# Patient Record
Sex: Male | Born: 1960 | Race: White | Hispanic: No | Smoking: Former smoker
Health system: Southern US, Community
[De-identification: ages and names within clinical notes are randomized; demographics above are authoritative.]

## PROBLEM LIST (undated history)

## (undated) DIAGNOSIS — J189 Pneumonia, unspecified organism: Secondary | ICD-10-CM

## (undated) DIAGNOSIS — E119 Type 2 diabetes mellitus without complications: Secondary | ICD-10-CM

## (undated) DIAGNOSIS — K029 Dental caries, unspecified: Secondary | ICD-10-CM

## (undated) DIAGNOSIS — K219 Gastro-esophageal reflux disease without esophagitis: Secondary | ICD-10-CM

## (undated) DIAGNOSIS — I10 Essential (primary) hypertension: Secondary | ICD-10-CM

## (undated) DIAGNOSIS — G4730 Sleep apnea, unspecified: Secondary | ICD-10-CM

## (undated) DIAGNOSIS — C801 Malignant (primary) neoplasm, unspecified: Secondary | ICD-10-CM

## (undated) HISTORY — PX: UMBILICAL HERNIA REPAIR: SHX196

---
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
## 2005-03-19 ENCOUNTER — Emergency Department (HOSPITAL_COMMUNITY): Admission: EM | Admit: 2005-03-19 | Discharge: 2005-03-20 | Payer: Self-pay | Admitting: Emergency Medicine

## 2005-06-08 ENCOUNTER — Emergency Department (HOSPITAL_COMMUNITY): Admission: EM | Admit: 2005-06-08 | Discharge: 2005-06-08 | Payer: Self-pay | Admitting: Emergency Medicine

## 2007-01-22 ENCOUNTER — Inpatient Hospital Stay (HOSPITAL_COMMUNITY): Admission: EM | Admit: 2007-01-22 | Discharge: 2007-01-26 | Payer: Self-pay | Admitting: Emergency Medicine

## 2008-07-12 ENCOUNTER — Emergency Department (HOSPITAL_BASED_OUTPATIENT_CLINIC_OR_DEPARTMENT_OTHER): Admission: EM | Admit: 2008-07-12 | Discharge: 2008-07-12 | Payer: Self-pay | Admitting: Emergency Medicine

## 2008-08-05 ENCOUNTER — Encounter: Payer: Self-pay | Admitting: Emergency Medicine

## 2008-08-05 ENCOUNTER — Inpatient Hospital Stay (HOSPITAL_COMMUNITY): Admission: EM | Admit: 2008-08-05 | Discharge: 2008-08-08 | Payer: Self-pay | Admitting: Internal Medicine

## 2008-09-05 ENCOUNTER — Encounter: Payer: Self-pay | Admitting: Emergency Medicine

## 2008-09-05 ENCOUNTER — Inpatient Hospital Stay (HOSPITAL_COMMUNITY): Admission: EM | Admit: 2008-09-05 | Discharge: 2008-09-09 | Payer: Self-pay | Admitting: Internal Medicine

## 2011-04-19 NOTE — H&P (Signed)
NAMEJADD, GASIOR NO.:  [id number]   MEDICAL RECORD NO.:  [id number]          PATIENT TYPE:  INP   LOCATION:  1526                         FACILITY:  [facility]   PHYSICIAN:  Della Goo, M.D. DATE OF BIRTH:  1960-12-18   DATE OF ADMISSION:  09/06/2008  DATE OF DISCHARGE:                              HISTORY & PHYSICAL   PRIMARY CARE PHYSICIAN:  Dr. Smith Mince.   CHIEF COMPLAINT:  Abdominal pain, diarrhea.   HISTORY OF PRESENT ILLNESS:  This is a 50 year old male presenting to  the emergency department with complaints of severe worsening abdominal  pain, discomfort and diarrhea since he was hospitalized and discharged  on August 08, 2008.  The patient was hospitalized August 05, 2008  until August 08, 2008, with enterocolitis, which was viral versus  bacterial in etiology.  The patient reports having three episodes of  loose stools daily since.  He denies any unusual or foul odor.  He  denies having any fevers or chills.  He does report beginning to have  nausea and vomiting.  He denies hematochezia, melena, passage and also  denies having any hematemesis.  When the patient was hospitalized, he  was placed on ciprofloxacin and Flagyl therapy and was discharged home  to take an additional 12 days of oral therapy.  The patient reports  taking this medication off and on and still has medication left over at  this time.   PAST MEDICAL HISTORY:  As mentioned above.  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  The patient reports not drinking  since that episode of pancreatitis.   MEDICATIONS:  Mentioned above.  Also, the patient had Phenergan p.r.n.  and Percocet p.r.n. pain.   PAST SURGICAL HISTORY:  History of nasal fracture surgery as a child.   SOCIAL HISTORY:  The patient denies tobacco usage, and he reports  quitting alcohol 2 months ago.   FAMILY HISTORY:  Negative for hypertension, coronary artery disease,  diabetes or  cancer.   REVIEW OF SYSTEMS:  Pertinents are mentioned above.   PHYSICAL EXAMINATION:  GENERAL:  This is a 50 year old well-nourished,  well-developed male in no discomfort or acute distress.  VITAL SIGNS:  Temperature 97.9, blood pressure 142/95, heart rate 100,  respirations 22.  O2 saturation 97-98%.  HEENT:  Normocephalic, atraumatic.  There is no scleral icterus.  Pupils  are equally round and reactive to light.  Extraocular movements are  intact.  Funduscopic benign.  Oropharynx is clear.  NECK:  Supple.  Full range of motion.  No thyromegaly, adenopathy or  jugulovenous distention.  CARDIOVASCULAR:  Regular rate and rhythm.  No murmurs, gallops or rubs.  LUNGS:  Clear to auscultation bilaterally.  ABDOMEN:  Positive bowel sounds.  Soft, nontender, nondistended.  EXTREMITIES: Without cyanosis, clubbing or edema.  NEUROLOGIC:  Nonfocal.   LABORATORY STUDIES:  White blood cell count 11.1, hemoglobin 15.3,  hematocrit 46.2, platelets 257, neutrophils 89%, lymphocytes 9%.  Sodium  144, potassium 4.0, chloride 108, bicarb 22, BUN 8, creatinine 0.7 and  glucose 127, albumin 4.9, lipase 67.  Urinalysis negative,  except for  greater than 80 ketones.  Alcohol level less than 10.  Urine drug screen  positive for benzodiazepines and positive for opiates.  Fecal occult  blood testing negative.  CT scan of the abdomen and pelvis revealed  mucosal edema of the sigmoid colon and rectum, which is slightly  improved compared to previous.   ASSESSMENT:  A 50 year old male being admitted with;  1. Abdominal pain.  2. Enterocolitis.  3. Diarrhea.   PLAN:  The patient will be admitted and placed on a clear liquid diet  for bowel rest at this time.  IV fluids have also been ordered for  maintenance therapy and rehydration.  The patient will be placed on IV  Cipro and IV Flagyl therapy.  P.r.n. antiemetics and pain control  therapy have been ordered.  Stool cultures will be sent for culture  and  sensitivity along with C diff studies and white blood cells.  DVT and GI  prophylaxis will also be ordered.  The patient will be placed on contact  precautions for now.  A GI consultation will be placed.      Della Goo, M.D.  Electronically Signed     HJ/MEDQ  D:  09/06/2008  T:  09/06/2008  Job:  [id number]   cc:   Talmadge Coventry, M.D.  Fax: [phone]

## 2011-04-19 NOTE — Discharge Summary (Signed)
[name], [name]:  [id number]   MEDICAL RECORD NO.:  [id number]          PATIENT TYPE:  INP   LOCATION:  1526                         FACILITY:  [facility]   PHYSICIAN:  [name], M.D.  DATE OF BIRTH:  1961/09/02   DATE OF ADMISSION:  09/05/2008  DATE OF DISCHARGE:  09/09/2008                               DISCHARGE SUMMARY   PRIMARY MEDICAL DOCTOR:  [name], M.D.   DISCHARGE DIAGNOSES:  1. Diarrhea, query etiology.  Workup negative.  2. History of alcohol-induced pancreatitis August 2009.  3. Smoking history.  4. History of childhood nasal fracture.   DISCHARGE MEDICATIONS:  1. Ciprofloxacin 500 mg p.o. b.i.d. for 14 days only.  2. Flagyl 500 mg p.o. t.i.d. for 14 days only.   PROCEDURES:  1. Abdominal/pelvic CT scan dated September 05, 2008.  This showed      distal small bowel and colonic bowel wall edema, persistent but      slightly improved from prior.  No free fluid in pelvis.  No pelvic      lymphadenopathy.  There is mucosal edema of the sigmoid colon and      rectum.  2. Colonoscopy dated September 08, 2008.  This was a negative study.  3. Abdominal ultrasound scan dated September 09, 2008.  This was an      unremarkable ultrasound of the abdomen.   CONSULTATIONS:  1. [name], M.D., gastroenterologist.  2. [name], M.D., gastroenterologist.   ADMISSION HISTORY:  As in H and P notes of September 06, 2008, dictated by  [name].  However, in brief, this is a 50 year old male,  with known history of alcohol-induced pancreatitis August 2009, smoking  history, history of nasal fracture in childhood, presenting with  diarrhea since discharge from hospitalization August 06, 2008 to  August 08, 2008 for similar symptoms.  He had been discharged on a  combination of Ciprofloxacin and Flagyl, and states that he has been  compliant with these medications since then.  Abdominal CT scan was done  which  showed findings consistent with enterocolitis.  He was admitted  for further evaluation, investigation and management.   CLINICAL COURSE:  1. Diarrhea.  For details of presentation, refer to admission history      above.  The patient was maintained on bowel rest, intravenous fluid      hydration, proton pump inhibitor, continued on Ciprofloxacin and      Flagyl.  Stool studies were sent off for C. difficile toxin, and      were consistently negative.  GI consultation was kindly provided by      [name], who felt that a colonoscopy was indicated to rule      out possible inflammatory bowel disease.  Colonoscopy was carried      out on September 08, 2008 by [name], and this was entirely      negative with no evidence of inflammation or other lesion.  Dr.      Loreta Bell recommended obtaining abdominal ultrasound scan to rule out  gallbladder disease.  This was carried out on September 09, 2008, and      was an unremarkable study.  The patient has been reassured      accordingly.  Per GI recommendations, the patient is to continue      Ciprofloxacin and Flagyl for another 2 weeks, and follow up with      gastroenterologist in due course.   1. Smoking history.  The patient was counseled appropriately.   DISPOSITION:  The patient was on September 09, 2008, considered clinically  stable for discharge, and was therefore discharged accordingly.   DIET:  Low residue.   ACTIVITY:  As tolerated.   FOLLOW-UP INSTRUCTIONS:  1. The patient is to follow up with [name], gastroenterologist      in 1-2 weeks.  He has been supplied with appropriate information.  2. In addition, he is to follow up routinely with his primary MD, Dr.      Talmadge Bell per prior scheduled appointment.   All this has been communicated to the patient and he has verbalized  understanding.      [name], M.D.  Electronically Signed     CO/MEDQ  D:  09/09/2008  T:  09/09/2008  Job:   [id number]   cc:   [name], M.D.  Fax: [phone]   [name], M.D.  Fax: [phone]   NGEXBM WUX LKGM, M.D.  Fax: [phone]

## 2011-04-19 NOTE — Op Note (Signed)
[name], MANSEL NO.:  [id number]   MEDICAL RECORD NO.:  [id number]          PATIENT TYPE:  INP   LOCATION:  1526                         FACILITY:  [facility]   PHYSICIAN:  Anselmo Rod, M.D.  DATE OF BIRTH:  Jul 26, 1961   DATE OF PROCEDURE:  09/08/2008  DATE OF DISCHARGE:  09/09/2008                               OPERATIVE REPORT   PROCEDURE PERFORMED:  Screening colonoscopy.   ENDOSCOPIST:  Anselmo Rod.   INSTRUMENT USED:  Pentax video colonoscope.   INDICATIONS FOR PROCEDURE:  Change in bowel habits with severe diarrhea,  abdominal bloating and gas in a 50 year old white male regarding repeat  hospitalization for question viral gastroenteritis.  The patient had a  CT scan of the abdomen and pelvis recently that showed edema of the  colonic mucosa and small bowel mucosa.  Colonoscopy is being done to  rule out IBD.   PREPROCEDURE PREPARATION:  Informed consent was procured from the  patient.  The patient fasted for 4 hours prior to the procedure and  prepped with a bottle of magnesium citrate and gallon of NuLytely the  night prior to the procedure.  Risks and benefits of the procedure  including a 10% miss rate of cancer and polyps were discussed with the  patient as well.   PREPROCEDURE PHYSICAL:  The patient had stable vital signs.  NECK:  Supple.  CHEST:  Clear to auscultation.  S1-S2 regular.  ABDOMEN:  Soft with normal bowel sounds.   PROCEDURE:  The patient was placed in left lateral decubitus position  and sedated with 75 mcg of Fentanyl and 7.5 mg of Versed given  intravenously in slow incremental doses.  Once the patient was  adequately sedated, maintained on low-flow oxygen and continuous cardiac  monitoring, the Pentax video colonoscope was advanced from the rectum to  the cecum.  The appendical orifice and ileocecal valve were clearly  visualized and photographed.  The terminal ileum appeared healthy and  without lesions.  No  masses, polyps, erosions, ulcerations or  diverticula were noted.   IMPRESSION:  1. Normal colonoscopy of the terminal ileum.  No masses, polyps,      erosions, ulcerations or diverticula noted.  2. No abnormalities noted on retroflexion.   RECOMMENDATIONS:  1. Continue antibiotics for total of at least 2-3 weeks.  2. An abdominal ultrasound has been recommended to rule out      gallstones.  3. Follow up with PCP as need arises in the future.      Anselmo Rod, M.D.  Electronically Signed     JNM/MEDQ  D:  09/09/2008  T:  09/10/2008  Job:  [id number]   cc:   Talmadge Coventry, M.D.  Fax: [phone]

## 2011-04-19 NOTE — H&P (Signed)
NAMEHAWARD, POPE NO.:  [id number]   MEDICAL RECORD NO.:  [id number]          PATIENT TYPE:  INP   LOCATION:  5123                         FACILITY:  [facility]   PHYSICIAN:  Vania Rea, M.D. DATE OF BIRTH:  1961-02-28   DATE OF ADMISSION:  08/06/2008  DATE OF DISCHARGE:                              HISTORY & PHYSICAL   PRIMARY CARE PHYSICIAN:  Dr. Talmadge Coventry.   CHIEF COMPLAINT:  Nausea, vomiting and abdominal pain, worse since this  morning.   HISTORY OF PRESENT ILLNESS:  This is a 50 year old Caucasian gentleman  who was seen in the [facility] emergency room 2 weeks ago with abdominal  pain and serum lipase of 800, and was diagnosed with acute pancreatitis  and discharged home with pain medications and antiemetics to follow up  with his primary care physician.  The patient was not able to follow up  with his primary care physician.  However, his symptoms subsided  substantially.  He did have abdominal pain on off, but today he started  having severe lower abdominal pain with nausea, vomiting and diarrhea  and returned to the emergency room where a CAT scan of the abdomen  revealed extensive enterocolitis and the patient was transferred to  [facility] for admission.   The patient denies any fever, chest pain or cough.  He denies any blood  in the stools.  Says the stool is just brown and watery.  The patient  denies chronic alcohol use, says he drinks alcohol only about twice per  year, but whenever he does have a drink he seems to have flares of  pancreatitis.   PAST MEDICAL HISTORY:  1. Pancreatitis precipitated by alcohol, last episode 2 weeks ago.  2. History of gastritis.  3. History of community-acquired pneumonia in February 2008.   MEDICATIONS:  1. Percocet recently completed  2. Phenergan suppositories   ALLERGIES:  NO KNOWN DRUG ALLERGIES.   SOCIAL HISTORY:  Smokes half a pack per day.  Denies alcohol or illicit  drug  use.   FAMILY HISTORY:  Negative for any hypertension, diabetes, coronary  disease or pancreatic disease or gastrointestinal cancer.   REVIEW OF SYSTEMS:  The patient has never had a colonoscopy and other  than noted above on the 10-point review of systems is unremarkable.   PHYSICAL EXAMINATION:  GENERAL:  Pleasant, middle-aged Caucasian  gentleman lying in the stretcher appears to be in mild painful distress.  VITAL SIGNS:  Temperature is 98.2, pulse 86, respirations 20, blood  pressure 139/84.  He is saturating at 97% on room air.  Weight 107.2  pounds and his height is 5-1/2 inches.  HEENT:  His pupils are round and equal.  Mucous membranes pink and  anicteric.  He has no cervical lymphadenopathy.  No thyromegaly, no  jugulovenous distention.  CHEST:  Clear to auscultation bilaterally.  CARDIOVASCULAR:  Regular rhythm without murmurs.  ABDOMEN:  Soft.  He has decreased bowel sounds and mild lower abdominal  tenderness.  EXTREMITIES:  Without edema.  He has 3+ bounding pulses bilaterally.  CENTRAL NERVOUS SYSTEM: Cranial  nerves II-XII are grossly intact.  He  has no focal neurologic deficit.   LABORATORY DATA:  White count is 17.4, hemoglobin 17.4, hematocrit 51,  platelets 241, 89% neutrophils.  Lipase is 104, serum chemistry with the  exception of glucose of 146 is completely normal.  His albumin is  elevated at 5.2, total protein 8.8, liver function tests, likewise,  completely normal.  His CT scan of the abdomen and pelvis as noted above  showed inflammatory changes in the rectum and sigmoid colon as well as  distal loops of the ileum and terminal ileum.   ASSESSMENT:  Acute enterocolitis.  Tobacco abuse   PLAN:  Will give this gentleman IV fluids.  Pain medications in view of  the inflammatory findings and elevated white count.  Will check stool  for C diff as well as general culture and will add antibiotics as  appropriate.  Will advise on tobacco cessation and offer  nicotine  replacement.      Vania Rea, M.D.  Electronically Signed     LC/MEDQ  D:  08/06/2008  T:  08/06/2008  Job:  [id number]   cc:   Talmadge Coventry, M.D.

## 2011-04-19 NOTE — Discharge Summary (Signed)
[name], [name] NO.:  [id number]   MEDICAL RECORD NO.:  [id number]          PATIENT TYPE:  INP   LOCATION:  5156                         FACILITY:  [facility]   PHYSICIAN:  Hillery Aldo, M.D.   DATE OF BIRTH:  1961/05/30   DATE OF ADMISSION:  08/05/2008  DATE OF DISCHARGE:  08/08/2008                               DISCHARGE SUMMARY   PRIMARY CARE PHYSICIAN:  Talmadge Coventry, M.D.   DISCHARGE DIAGNOSES:  1. Enterocolitis.  2. Diarrhea secondary to enterocolitis.  3. Hypokalemia.  4. History of alcohol-induced pancreatitis.  5. History of alcohol-induced gastritis.  6. Ongoing tobacco abuse.   DISCHARGE MEDICATIONS:  1. Cipro 500 mg b.i.d. x12 days.  2. Flagyl 500 mg t.i.d. x12 days.  3. Protonix 40 mg daily.  4. Percocet 5/325 one tablet q.6 h. p.r.n. pain (#20 written for).   CONSULTATIONS:  None.   BRIEF ADMISSION HISTORY OF PRESENT ILLNESS:  The patient is a 50-year-  old male, who presented to the hospital with chief complaint of nausea,  vomiting, and abdominal pain.  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  He was recently hospitalized at  [facility] for pancreatitis.  Upon initial evaluation in emergency  department, he was not found to have any evidence of recurrent  pancreatitis, but did have extensive enterocolitis on CT scanning of his  abdomen, and therefore, was admitted for further evaluation and  treatment.  For the full details, please see the dictated report done by  Dr. Orvan Falconer.   PROCEDURES AND DIAGNOSTIC STUDIES:  1. Acute abdominal series on August 05, 2008, showed normal bowel      gas pattern and no evidence for free peritoneal air.  No evidence      for active chest disease.  2. CT scan of the abdomen and pelvis on August 05, 2008, showed a      fairly extensive inflammatory process involving the small bowel and      colon.  Diffuse inflammatory process involving the pelvic loops  of      small bowel and colon with inflammatory infectious enterocolitis      being in the differential.   DISCHARGE LABORATORY VALUES:  Sodium is 136, potassium 3.9, chloride  104, bicarb 26, BUN 6, creatinine 0.82, glucose 120.  White blood cell  count was 8.5, hemoglobin 14, hematocrit 41.3, platelets 192.   HOSPITAL COURSE:  1. Enterocolitis:  The patient was admitted and empirically put on      ciprofloxacin and Flagyl.  Stool studies were sent off.  Stool      cultures are negative for any suspicious colonies, but the report      is preliminary at this time.  C. difficile toxin analysis was      negative.  Fecal lactoferrin was negative.  He did have some      hemoccult-positive stool and 1 episode of melena in the hospital,      but this cleared and his hemoglobin did not drop.  At this point,      the patient  is improving dramatically clinically.  He is able to      keep down p.o.'s without difficulty.  The diarrhea, which was      watery on presentation, is now softer, though still not formed.  He      will be discharged on additional 12 days of Cipro and Flagyl, and      he is advised to avoid alcohol intake while on Flagyl due to      serious adverse reaction with combining these substances.  2. Hypokalemia:  Secondary to GI losses.  The patient was      appropriately repleted.  3. History of alcohol pancreatitis/gastritis.  The patient was      maintained on proton pump inhibitor therapy.  His lipase was      checked on initial presentation and was found to be normal at 104.  4. Tobacco abuse:  The patient was counseled on the importance of      cessation.   DISPOSITION:  The patient is medically stable and will be discharged  home.  Advised to follow up with his primary care physician for any  nonresolution of symptoms or in 1-2 weeks for routine hospital followup.   Time spent on discharge, 35 minutes.      Hillery Aldo, M.D.  Electronically Signed      CR/MEDQ  D:  08/08/2008  T:  08/09/2008  Job:  [id number]   cc:   Talmadge Coventry, M.D.

## 2011-04-22 NOTE — Discharge Summary (Signed)
NAMEWINIFRED, BALOGH NO.:  [id number]   MEDICAL RECORD NO.:  [id number]          PATIENT TYPE:  INP   LOCATION:  5713                         FACILITY:  [facility]   PHYSICIAN:  Lonia Blood, M.D.       DATE OF BIRTH:  09-07-1961   DATE OF ADMISSION:  01/22/2007  DATE OF DISCHARGE:  01/26/2007                               DISCHARGE SUMMARY   DISCHARGE DIAGNOSES:  1. Right upper lobe pneumonia.  2. Hypoxic respiratory failure resolved.  3. Tobacco abuse.   DISCHARGE MEDICATIONS:  Avelox 400 mg daily for about a week.   CONDITION ON DISCHARGE:  Mr. Granada was discharged in good condition.  At the time of discharge, he was afebrile with stable vital signs.  The  patient was able to tolerate a regular diet without any complications.   PROCEDURES DURING THIS ADMISSION:  1. Chest x-ray PA and lateral with findings of right upper lobe      pneumonia.  2. CT scan of the chest, abdomen, and pelvis with findings of right      upper lobe pneumonia and no other significant pathology.   CONSULTATIONS:  No consultations were obtained.   HOSPITAL COURSE:  Community-acquired pneumonia.  Mr. Steidle was admitted  with severe right upper lobe pneumonia and fever and chills and lack of  appetite and inability to care for himself.  The patient was placed on  intravenous empiric antibiotics for community-acquired pneumonia and  intravenous fluids, nebulizers, and oxygen.  The patient made a  progressive recovery, and over the course of the next days he improved  to a point where it was felt safe to discharge the patient home.  Mr.  Kafer tolerated a regular diet by the time of discharge, and he was  able to be changed to oral antibiotics without recurrence of his fever.   At the time of discharge, the patient was strongly advised to consider  smoking cessation.      Lonia Blood, M.D.  Electronically Signed     SL/MEDQ  D:  02/05/2007  T:  02/05/2007  Job:  [id number]   cc:    Talmadge Coventry, M.D.

## 2011-04-22 NOTE — H&P (Signed)
NAMECHADWIN, [name] NO.:  [id number]   MEDICAL RECORD NO.:  [id number]          PATIENT TYPE:  INP   LOCATION:  1831                         FACILITY:  [facility]   PHYSICIAN:  Hettie Holstein, D.O.    DATE OF BIRTH:  1961-11-27   DATE OF ADMISSION:  01/22/2007  DATE OF DISCHARGE:                              HISTORY & PHYSICAL   PRIMARY CARE PHYSICIAN:  Dr. Talmadge Coventry.   CHIEF COMPLAINT:  Generalized aches, nausea and inability to keep food  down.   HISTORY OF PRESENT ILLNESS:  [name] is a 50 year old male with a  relatively unremarkable past medical history, who was in his usual state  of health up until Thursday night.  He stated he developed some low back  pain and cramping.  This became quite severe that he developed some  nausea.  He was unable to keep over-the-counter medications or even  water down.  He had some low-grade temperatures.  Otherwise, he has had  no other complaints.  In the emergency department, he is discovered to  have radiographic evidence of pneumonia and elevated white count, as  well as a metabolic profile suggestive of dehydration and he is given  some IV fluids and appears to be hemodynamically stable at present.  He  is being admitted for treatment of pneumonia.   PAST MEDICAL HISTORY:  Unremarkable.   MEDICATIONS:  He takes no prescription medications.   ALLERGIES:  NO KNOWN DRUG ALLERGIES.   SOCIAL HISTORY:  Patient smokes less than a pack of cigarettes per day.  He denies alcohol.  He lives with his wife who is disabled with  fibromyalgia and predominance of his work is taking care of her.  He  does work as a Music therapist.  He has no children, though his wife does have  a grown child from another marriage.   FAMILY HISTORY:  Noncontributory.   REVIEW OF SYSTEMS:  He had been in his usual state of health with the  exception of chronic low back aches and pains, which is not new.  He has  nausea with his presenting  illness.  Otherwise, further review is  unremarkable.   PHYSICAL EXAMINATION:  VITAL SIGNS:  In the emergency department, he is  initially febrile with a temperature of 100.9.  Initially tachycardic.  He did, however, respond to IV fluids.  Blood pressure 124/76.   LABS:  Sodium 128, potassium 3.3, BUN 19, creatinine 1.3, glucose 113,  CO2 of 27.  WBC 20.6, hemoglobin 14.9, platelet count 157, MCV of 84.  Chest x-ray revealed an abnormal density in the right upper lobe,  suggestive of possible pneumonia.  The radiologist, however, did  recommend a followup chest radiograph to rule out a mass.   ASSESSMENT:  1. Community acquired pneumonia.  2. Tobacco dependence.  3. Fever secondary to number 1.  4. Low back pain, chronic.  5. Hypokalemia.  6. Intractable nausea.   PLAN:  At this time, we are going to admit [name] for IV  antibiotics.  We will await the blood cultures and follow his  clinical  course.  We will spot check his oxygen saturation to ensure that he  remains adequately oxygenated.  We will request an EKG as one was not  done in the emergency department.  I will have them fax this to me when  it is completed.      Hettie Holstein, D.O.  Electronically Signed     ESS/MEDQ  D:  01/22/2007  T:  01/23/2007  Job:  [id number]   cc:   Talmadge Coventry, M.D.

## 2011-09-02 LAB — CBC
Hemoglobin: 15.1
MCV: 83.2
Platelets: 225
WBC: 10.5

## 2011-09-02 LAB — DIFFERENTIAL
Lymphocytes Relative: 31
Lymphs Abs: 3.3
Monocytes Absolute: 0.6
Monocytes Relative: 6
Neutro Abs: 6.4
Neutrophils Relative %: 61

## 2011-09-02 LAB — POCT TOXICOLOGY PANEL
Opiates: POSITIVE
Tetrahydrocannabinol: POSITIVE

## 2011-09-02 LAB — COMPREHENSIVE METABOLIC PANEL
BUN: 9
CO2: 27
GFR calc non Af Amer: >60
Glucose, Bld: 109 — ABNORMAL HIGH
Sodium: 135
Total Protein: 6.9

## 2011-09-02 LAB — URINALYSIS, ROUTINE W REFLEX MICROSCOPIC
Glucose, UA: NEGATIVE
Nitrite: NEGATIVE

## 2011-09-05 LAB — BASIC METABOLIC PANEL
BUN: 4 — ABNORMAL LOW
Calcium: 8.9
GFR calc Af Amer: >60
GFR calc Af Amer: >60
GFR calc Af Amer: >60
GFR calc non Af Amer: >60
GFR calc non Af Amer: >60
GFR calc non Af Amer: >60
Glucose, Bld: 100 — ABNORMAL HIGH
Glucose, Bld: 104 — ABNORMAL HIGH
Potassium: 3 — ABNORMAL LOW
Potassium: 3.5
Sodium: 137
Sodium: 138

## 2011-09-05 LAB — CLOSTRIDIUM DIFFICILE EIA: C difficile Toxins A+B, EIA: NEGATIVE

## 2011-09-05 LAB — CBC
Hemoglobin: 13.5
MCHC: 33.7
RDW: 14.6

## 2011-09-05 LAB — STOOL CULTURE

## 2011-09-06 LAB — COMPREHENSIVE METABOLIC PANEL
ALT: 25
Albumin: 4.9
Alkaline Phosphatase: 61
Potassium: 4
Sodium: 144
Total Protein: 8.3

## 2011-09-06 LAB — CBC
Hemoglobin: 15.3
Platelets: 257
RDW: 14.1
WBC: 11.1 — ABNORMAL HIGH

## 2011-09-06 LAB — URINALYSIS, ROUTINE W REFLEX MICROSCOPIC
Glucose, UA: NEGATIVE
Hgb urine dipstick: NEGATIVE
Ketones, ur: >80 — AB
Protein, ur: 100 — AB

## 2011-09-06 LAB — DIFFERENTIAL
Basophils Relative: 1
Eosinophils Absolute: 0
Eosinophils Relative: 0
Monocytes Absolute: 0.2
Monocytes Relative: 2 — ABNORMAL LOW

## 2011-09-06 LAB — POCT TOXICOLOGY PANEL

## 2011-09-06 LAB — URINE MICROSCOPIC-ADD ON

## 2011-09-06 LAB — ETHANOL: Alcohol, Ethyl (B): <10

## 2011-09-07 LAB — CBC
HCT: 51
Hemoglobin: 14
Hemoglobin: 14.5
Hemoglobin: 17.4 — ABNORMAL HIGH
MCHC: 33.8
MCHC: 34.4
MCHC: 34.4
MCV: 84.3
MCV: 83.1
Platelets: 193
Platelets: 241
RBC: 4.9
RBC: 5.03
RDW: 13.9
RDW: 14.6
WBC: 10.6 — ABNORMAL HIGH
WBC: 17.3 — ABNORMAL HIGH

## 2011-09-07 LAB — COMPREHENSIVE METABOLIC PANEL
ALT: 14
Albumin: 3.7
Albumin: 5.2
Alkaline Phosphatase: 40
Alkaline Phosphatase: 73
BUN: 10
CO2: 24
Calcium: 9.1
Chloride: 104
Creatinine, Ser: 0.8
GFR calc non Af Amer: >60
Glucose, Bld: 146 — ABNORMAL HIGH
Potassium: 4.1
Potassium: 3.9
Sodium: 136
Total Bilirubin: 0.6
Total Protein: 6.3

## 2011-09-07 LAB — STOOL CULTURE

## 2011-09-07 LAB — DIFFERENTIAL
Basophils Absolute: 0.2 — ABNORMAL HIGH
Eosinophils Absolute: 0
Lymphocytes Relative: 7 — ABNORMAL LOW
Monocytes Relative: 3
Neutro Abs: 15.4 — ABNORMAL HIGH
Neutrophils Relative %: 89 — ABNORMAL HIGH

## 2011-09-07 LAB — URINALYSIS, ROUTINE W REFLEX MICROSCOPIC
Glucose, UA: NEGATIVE
Ketones, ur: 15 — AB
Leukocytes, UA: NEGATIVE
Protein, ur: 30 — AB
Urobilinogen, UA: 0.2

## 2011-09-07 LAB — BASIC METABOLIC PANEL
CO2: 25
CO2: 26
Calcium: 9
Calcium: 9.1
Chloride: 104
Creatinine, Ser: 0.82
GFR calc Af Amer: >60
Glucose, Bld: 120 — ABNORMAL HIGH
Sodium: 136
Sodium: 138

## 2011-09-07 LAB — URINE MICROSCOPIC-ADD ON

## 2011-09-07 LAB — CLOSTRIDIUM DIFFICILE EIA
C difficile Toxins A+B, EIA: NEGATIVE
C difficile Toxins A+B, EIA: NEGATIVE

## 2011-09-07 LAB — FECAL LACTOFERRIN, QUANT

## 2014-03-18 ENCOUNTER — Encounter (INDEPENDENT_AMBULATORY_CARE_PROVIDER_SITE_OTHER): Payer: Self-pay | Admitting: General Surgery

## 2014-03-18 ENCOUNTER — Encounter (INDEPENDENT_AMBULATORY_CARE_PROVIDER_SITE_OTHER): Payer: Self-pay

## 2014-03-18 ENCOUNTER — Ambulatory Visit (INDEPENDENT_AMBULATORY_CARE_PROVIDER_SITE_OTHER): Payer: BC Managed Care – PPO | Admitting: General Surgery

## 2014-03-18 VITALS — BP 178/104 | HR 96 | Temp 97.7°F | Resp 16 | Ht 70.0 in | Wt 250.8 lb

## 2014-03-18 DIAGNOSIS — IMO0002 Reserved for concepts with insufficient information to code with codable children: Secondary | ICD-10-CM

## 2014-03-18 MED ORDER — DOXYCYCLINE HYCLATE 100 MG PO TABS: 100.0000 mg | ORAL_TABLET | Freq: Two times a day (BID) | ORAL | Status: DC

## 2014-03-18 NOTE — Patient Instructions (Addendum)
Remove bandage tomorrow. Wash wound with warm water twice a day and apply a dry bulky dressing. Call if he had heavy bleeding or wound problems. Stop taking Keflex. Take doxycycline instead.

## 2014-03-18 NOTE — Progress Notes (Signed)
Patient ID: [name], male   DOB: 10/24/1961, 53 y.o.   MRN: [id number]  Chief Complaint  Patient presents with  . Follow-up    infectede cyst on Lft arm    HPI [name] is a 53 y.o. male.   HPI  He is referred by Dr. Rex Kras for further evaluation and treatment of a left olecranon fossa abscess. He states he's had what is felt to be a sebaceous cyst there for many years. He began to swell and drained. He saw Dr. Rex Kras her started on some Keflex and try to debride some of the area. He continues to have purulent drainage from it.  History reviewed. No pertinent past medical history.  History reviewed. No pertinent past surgical history.  Family History  Problem Relation Age of Onset  . Stroke Mother   . Heart disease Father     Social History History  Substance Use Topics  . Smoking status: Current Every Day Smoker -- 0.50 packs/day    Types: Cigarettes  . Smokeless tobacco: Not on file  . Alcohol Use: Yes     Comment: twice a year    No Known Allergies  Current Outpatient Prescriptions  Medication Sig Dispense Refill  . Cephalexin (KEFLEX PO) Take by mouth QID.      Marland Kitchen doxycycline (VIBRA-TABS) 100 MG tablet Take 1 tablet (100 mg total) by mouth 2 (two) times daily.  14 tablet  0   No current facility-administered medications for this visit.    Review of Systems Review of Systems  Constitutional: Negative.   Respiratory: Negative.   Cardiovascular: Negative.     Blood pressure 178/104, pulse 96, temperature 97.7 F (36.5 C), temperature source Temporal, resp. rate 16, height 5\' 10"  (1.778 m), weight 250 lb 12.8 oz (113.762 kg).  Physical Exam Physical Exam  Constitutional: No distress.  overweight  Musculoskeletal:  Open wound left olecranon fossa with white thick material extruding from this. I debrided some of this material. The area was cleaned and anesthetized. I sharply opened up  the cavity and began removing more caseous material as well as cyst  capsule. I then packed the wound tightly with iodoform gauze followed by dry dressing.    Data Reviewed None  Assessment    Left arm abscess status post incision, drainage, and debridement.     Plan    Switch antibiotics to doxycycline. I told him to remove the packing and irrigate the wound twice warm water and apply a bulky dry dressing. Return visit in 4 weeks.        Rhunette Croft Kieryn Burtis 03/18/2014, 3:22 PM

## 2014-04-22 ENCOUNTER — Encounter (INDEPENDENT_AMBULATORY_CARE_PROVIDER_SITE_OTHER): Payer: BC Managed Care – PPO | Admitting: General Surgery

## 2014-07-29 ENCOUNTER — Encounter: Payer: Self-pay | Admitting: Cardiology

## 2014-07-29 ENCOUNTER — Ambulatory Visit (INDEPENDENT_AMBULATORY_CARE_PROVIDER_SITE_OTHER): Payer: BC Managed Care – PPO | Admitting: Cardiology

## 2014-07-29 VITALS — BP 140/98 | HR 107 | Ht 70.0 in | Wt 254.0 lb

## 2014-07-29 DIAGNOSIS — R000 Tachycardia, unspecified: Secondary | ICD-10-CM

## 2014-07-29 DIAGNOSIS — IMO0002 Reserved for concepts with insufficient information to code with codable children: Secondary | ICD-10-CM

## 2014-07-29 DIAGNOSIS — I498 Other specified cardiac arrhythmias: Secondary | ICD-10-CM

## 2014-07-29 DIAGNOSIS — E7800 Pure hypercholesterolemia, unspecified: Secondary | ICD-10-CM

## 2014-07-29 DIAGNOSIS — R0789 Other chest pain: Secondary | ICD-10-CM

## 2014-07-29 DIAGNOSIS — R220 Localized swelling, mass and lump, head: Secondary | ICD-10-CM

## 2014-07-29 DIAGNOSIS — I119 Hypertensive heart disease without heart failure: Secondary | ICD-10-CM

## 2014-07-29 DIAGNOSIS — R972 Elevated prostate specific antigen [PSA]: Secondary | ICD-10-CM

## 2014-07-29 DIAGNOSIS — R221 Localized swelling, mass and lump, neck: Secondary | ICD-10-CM

## 2014-07-29 LAB — T4, FREE: Free T4: 0.88 ng/dL (ref 0.60–1.60)

## 2014-07-29 LAB — BASIC METABOLIC PANEL
BUN: 12 mg/dL (ref 6–23)
CO2: 29 mEq/L (ref 19–32)
Calcium: 9.8 mg/dL (ref 8.4–10.5)
Chloride: 100 mEq/L (ref 96–112)
Creatinine, Ser: 1.2 mg/dL (ref 0.4–1.5)
GFR: 69.81 mL/min (ref >60.00)
Glucose, Bld: 110 mg/dL — ABNORMAL HIGH (ref 70–99)
Potassium: 3.5 mEq/L (ref 3.5–5.1)
Sodium: 137 mEq/L (ref 135–145)

## 2014-07-29 LAB — TSH: TSH: 0.88 u[IU]/mL (ref 0.35–4.50)

## 2014-07-29 MED ORDER — LOSARTAN POTASSIUM 50 MG PO TABS: 50.0000 mg | ORAL_TABLET | Freq: Every day | ORAL | Status: DC

## 2014-07-29 NOTE — Patient Instructions (Signed)
A chest x-ray takes a picture of the organs and structures inside the chest, including the heart, lungs, and blood vessels. This test can show several things, including, whether the heart is enlarges; whether fluid is building up in the lungs; and whether pacemaker / defibrillator leads are still in place.  Your physician has requested that you have a lexiscan myoview. For further information please visit [email or web address]. Please follow instruction sheet, as given.  INCREASE LOSARTAN TO 50 MG DAILY    Follow up as needed

## 2014-07-29 NOTE — Progress Notes (Signed)
[name] Date of Birth:  [birth year] [place] [address] [place], [place]  [address] [phone]        Fax   [phone]   History of Present Illness: This 53 year old gentleman is seen at the request of Dr. Orland Mustard for evaluation of chest discomfort.  The patient does not have any history of known coronary disease.  He does have a history of high blood pressure and hypercholesterolemia.  His recent LDL cholesterol was 149.  He has been prescribed a statin but has not started it yet.  The patient smokes about one pack of cigarettes a day.  He was recently started on losartan 25 mg daily 2 weeks ago.  He states that his heart rate always tends to run a little fast.  He has had some intermittent chest pressure in the precordial area without radiation to the arms.  The discomfort was not related to exertion. The family history reveals that his father died at age 62 and had a succession of heart attacks and strokes.  His mother died at 84 of a stroke. Other medical problems that the patient is dealing with is an enlarging left submandibular mass.  He saw a dermatologist about that this morning in the dermatologist told him that he should see an ENT physician.  The patient also had a recent PSA level which was elevated and he is scheduled to have a prostate biopsy in late September. In regard to his sinus tachycardia, the patient had a recent CBC which showed elevated hemoglobin level but no evidence of anemia.  The patient is not known to have any thyroid problems.  Patient has not had a recent chest x-ray and we are ordering one today. The social history reveals that he is a widower.  His wife died 5 years ago.  He is a Dance movement psychotherapist.  He is available for doctor's appointments on Mondays and Tuesdays.  Current Outpatient Prescriptions  Medication Sig Dispense Refill  . Cephalexin (KEFLEX PO) Take by mouth QID.      Marland Kitchen doxycycline (VIBRA-TABS) 100 MG  tablet Take 1 tablet (100 mg total) by mouth 2 (two) times daily.  14 tablet  0  . losartan (COZAAR) 50 MG tablet Take 1 tablet (50 mg total) by mouth daily.  30 tablet  3   No current facility-administered medications for this visit.    No Known Allergies  Patient Active Problem List   Diagnosis Date Noted  . Chest discomfort 07/29/2014  . Benign hypertensive heart disease without heart failure 07/29/2014  . Hypercholesterolemia 07/29/2014  . Mass of left submandibular region 07/29/2014  . Sinus tachycardia 07/29/2014  . Elevated PSA measurement 07/29/2014  . Cellulitis and abscess of upper arm and forearm-left olecranon fossa 03/18/2014    History  Smoking status  . Current Every Day Smoker -- 0.50 packs/day  . Types: Cigarettes  Smokeless tobacco  . Not on file    History  Alcohol Use  . Yes    Comment: twice a year    Family History  Problem Relation Age of Onset  . Stroke Mother   . Heart disease Father     Review of Systems: Constitutional: no fever chills diaphoresis or fatigue or change in weight.  Head and neck: no hearing loss, no epistaxis, no photophobia or visual disturbance. Respiratory: No cough, shortness of breath or wheezing. Cardiovascular: No chest pain peripheral edema, palpitations. Gastrointestinal: No abdominal distention, no abdominal pain, no change  in bowel habits hematochezia or melena. Genitourinary: No dysuria, no frequency, no urgency, no nocturia. Musculoskeletal:No arthralgias, no back pain, no gait disturbance or myalgias. Neurological: No dizziness, no headaches, no numbness, no seizures, no syncope, no weakness, no tremors. Hematologic: No lymphadenopathy, no easy bruising. Psychiatric: No confusion, no hallucinations, no sleep disturbance.    Physical Exam: Filed Vitals:   07/29/14 1118  BP: 140/98  Pulse: 107  The patient appears to be in no distress.  He is mildly plethoric  Head and neck exam reveals that the pupils  are equal and reactive.  The extraocular movements are full.  There is no scleral icterus.  Mouth and pharynx are benign.  No lymphadenopathy.  There is a large left submandibular mass which is rubbery and is movable.  No carotid bruits.  The jugular venous pressure is normal.  Thyroid is not enlarged or tender.  Chest is clear to percussion and auscultation.  No rales or rhonchi.  Expansion of the chest is symmetrical.  Heart reveals no abnormal lift or heave.  First and second heart sounds are normal.  There is no murmur gallop rub or click.  The abdomen is soft and nontender.  Bowel sounds are normoactive.  There is no hepatosplenomegaly or mass.  There are no abdominal bruits.  Extremities reveal no phlebitis or edema.  Pedal pulses are good.  There is no cyanosis or clubbing.  Neurologic exam is normal strength and no lateralizing weakness.  No sensory deficits.  Integument reveals no rash  EKG today shows sinus tachycardia at 107 per minute.  There are nonspecific ST-T wave changes in the inferior leads.  Assessment / Plan: 1.  Chest pain undetermined etiology.  Multiple risk factors for coronary disease including hypertension, hypercholesterolemia, and smoking, and family history. 2. essential hypertension 3. Hypercholesterolemia 4.  Elevated PSA level 5. large left submandibular mass unknown etiology 6. sinus tachycardia.  History of high caffeine intake.   Disposition: We will get a chest x-ray today.  We will get a free T4 and a TSH and a basal metabolic panel today. His blood pressure is still not adequately controlled and we will increase his losartan and up to 50 mg daily. We will have him return soon for a lexiscan Myoview stress test.  Many thanks for the opportunity to see this pleasant gentleman and I will be in touch with you regarding his results of his studies.

## 2014-07-30 ENCOUNTER — Other Ambulatory Visit: Payer: Self-pay | Admitting: Family Medicine

## 2014-07-30 DIAGNOSIS — R221 Localized swelling, mass and lump, neck: Secondary | ICD-10-CM

## 2014-08-05 ENCOUNTER — Ambulatory Visit (HOSPITAL_COMMUNITY): Payer: BC Managed Care – PPO | Attending: Cardiovascular Disease | Admitting: Radiology

## 2014-08-05 ENCOUNTER — Other Ambulatory Visit: Payer: BC Managed Care – PPO

## 2014-08-05 VITALS — BP 138/98 | HR 82 | Ht 70.0 in | Wt 253.0 lb

## 2014-08-05 DIAGNOSIS — R0602 Shortness of breath: Secondary | ICD-10-CM

## 2014-08-05 DIAGNOSIS — R42 Dizziness and giddiness: Secondary | ICD-10-CM | POA: Insufficient documentation

## 2014-08-05 DIAGNOSIS — Z8249 Family history of ischemic heart disease and other diseases of the circulatory system: Secondary | ICD-10-CM | POA: Insufficient documentation

## 2014-08-05 DIAGNOSIS — R0789 Other chest pain: Secondary | ICD-10-CM

## 2014-08-05 DIAGNOSIS — R0609 Other forms of dyspnea: Secondary | ICD-10-CM | POA: Diagnosis not present

## 2014-08-05 DIAGNOSIS — R9431 Abnormal electrocardiogram [ECG] [EKG]: Secondary | ICD-10-CM | POA: Diagnosis not present

## 2014-08-05 DIAGNOSIS — R0989 Other specified symptoms and signs involving the circulatory and respiratory systems: Secondary | ICD-10-CM | POA: Insufficient documentation

## 2014-08-05 DIAGNOSIS — R079 Chest pain, unspecified: Secondary | ICD-10-CM

## 2014-08-05 MED ORDER — REGADENOSON 0.4 MG/5ML IV SOLN
0.4000 mg | Freq: Once | INTRAVENOUS | Status: AC
  Administered 2014-08-05: 0.4 mg via INTRAVENOUS

## 2014-08-05 MED ORDER — TECHNETIUM TC 99M SESTAMIBI GENERIC - CARDIOLITE
11.0000 | Freq: Once | INTRAVENOUS | Status: AC | PRN
Start: 2014-08-05 — End: 2014-08-05
  Administered 2014-08-05: 11 via INTRAVENOUS

## 2014-08-05 MED ORDER — TECHNETIUM TC 99M SESTAMIBI GENERIC - CARDIOLITE
33.0000 | Freq: Once | INTRAVENOUS | Status: AC | PRN
Start: 2014-08-05 — End: 2014-08-05
  Administered 2014-08-05: 33 via INTRAVENOUS

## 2014-08-05 NOTE — Progress Notes (Signed)
[name] [place] [address] [place], [place] [address] [phone]    Cardiology Nuclear Med Study  [name] is a 53 y.o. male     MRN : [id number]     DOB: [birth year]  Procedure Date: 08/05/2014  Nuclear Med Background Indication for Stress Test:  Evaluation for Ischemia and Abnormal TAV:[id number] ST-T wave changes History:  No prior known history of CAD Cardiac Risk Factors: Family History - CAD, Hypertension, Lipids and Smoker  Symptoms: Chest Pressure with/without exertion (last occurrence one week ago), Dizziness and DOE   Nuclear Pre-Procedure Caffeine/Decaff Intake:  None NPO After: 9:00pm   Lungs:  clear O2 Sat: 98%% on room air. IV 0.9% NS with Angio Cath:  22g  IV Site: L Hand  IV Started by:  Matilde Haymaker, RN  Chest Size (in):  44 Cup Size: n/a  Height: 5\' 10"  (1.778 m)  Weight:  253 lb (114.76 kg)  BMI:  Body mass index is 36.3 kg/(m^2). Tech Comments:  Last dose of  Cozaar was yesterday. [name], RN.    Nuclear Med Study 1 or 2 day study: 1 day  Stress Test Type:  Carlton Adam  Reading MD: n/a  Order Authorizing Provider:  Henrene Dodge  Resting Radionuclide: Technetium 75m Sestamibi  Resting Radionuclide Dose: 11.0 mCi   Stress Radionuclide:  Technetium 27m Sestamibi  Stress Radionuclide Dose: 33.0 mCi           Stress Protocol Rest HR: 82 Stress HR: 108  Rest BP: 138/98 Stress BP: 159/108  Exercise Time (min): n/a METS: n/a   Predicted Max HR: 167 bpm % Max HR: 64.67 bpm Rate Pressure Product: 17172   Dose of Adenosine (mg):  n/a Dose of Lexiscan: 0.4 mg  Dose of Atropine (mg): n/a Dose of Dobutamine: n/a mcg/kg/min (at max HR)  Stress Test Technologist: [name], RN  Nuclear Technologist:  Annye Rusk, CNMT     Rest Procedure:  Myocardial perfusion imaging was performed at rest 45 minutes following the intravenous administration of Technetium 36m Sestamibi. Rest ECG: NSR - Normal EKG  Stress  Procedure:  The patient received IV Lexiscan 0.4 mg over 15-seconds.  Technetium 57m Sestamibi injected at 30-seconds.  The patient complained of Cough, (L) arm numbness, Chest felt "Light", and headache. Quantitative spect images were obtained after a 45 minute delay. Stress ECG: No significant change from baseline ECG  QPS Raw Data Images:  Normal; no motion artifact; normal heart/lung ratio. Stress Images:  Normal homogeneous uptake in all areas of the myocardium. Rest Images:  Normal homogeneous uptake in all areas of the myocardium. Subtraction (SDS):  No evidence of ischemia. Transient Ischemic Dilatation (Normal <1.22):  1.10 Lung/Heart Ratio (Normal <0.45):  0.36  Quantitative Gated Spect Images QGS EDV:  142 ml QGS ESV:  75 ml  Impression Exercise Capacity:  Lexiscan with no exercise. BP Response:  Normal blood pressure response. Clinical Symptoms:  Chest felt "light" ECG Impression:  No significant ECG changes with Lexiscan. Comparison with Prior Nuclear Study: No previous nuclear study performed  Overall Impression:  Intermediate risk stress nuclear study with no reversible ischemia.  LV Ejection Fraction: 47%.  LV Wall Motion:  septal hypokinesis  Pixie Casino, MD, [facility] Board Certified in Nuclear Cardiology Attending Cardiologist [facility]

## 2014-08-06 ENCOUNTER — Telehealth: Payer: Self-pay | Admitting: *Deleted

## 2014-08-06 NOTE — Telephone Encounter (Signed)
Message copied by Earvin Hansen on Wed Aug 06, 2014  1:55 PM ------      Message from: Darlin Coco      Created: Wed Aug 06, 2014 12:46 PM       Please report.  The stress test did not show any ischemia.  The patient is okay to proceed with his surgery.  Continue current medication.  Send to his PCP Dr. Orland Mustard. ------

## 2014-08-06 NOTE — Telephone Encounter (Signed)
Advised patient of report, patient stated no surgery scheduled. Will forward to Dr Orland Mustard.

## 2014-08-08 ENCOUNTER — Institutional Professional Consult (permissible substitution): Payer: Self-pay | Admitting: Cardiology

## 2014-08-13 ENCOUNTER — Ambulatory Visit
Admission: RE | Admit: 2014-08-13 | Discharge: 2014-08-13 | Disposition: A | Discharge status: Home / Self Care | Payer: BC Managed Care – PPO | Source: Ambulatory Visit | Attending: Cardiology | Admitting: Cardiology

## 2014-08-13 ENCOUNTER — Ambulatory Visit
Admission: RE | Admit: 2014-08-13 | Discharge: 2014-08-13 | Disposition: A | Discharge status: Home / Self Care | Payer: BC Managed Care – PPO | Source: Ambulatory Visit | Attending: Family Medicine | Admitting: Family Medicine

## 2014-08-13 ENCOUNTER — Other Ambulatory Visit (HOSPITAL_COMMUNITY): Payer: Self-pay | Admitting: Family Medicine

## 2014-08-13 DIAGNOSIS — R221 Localized swelling, mass and lump, neck: Secondary | ICD-10-CM

## 2014-08-13 DIAGNOSIS — R0789 Other chest pain: Secondary | ICD-10-CM

## 2014-08-13 MED ORDER — IOHEXOL 300 MG/ML  SOLN
75.0000 mL | Freq: Once | INTRAMUSCULAR | Status: AC | PRN
  Administered 2014-08-13: 75 mL via INTRAVENOUS

## 2014-08-14 ENCOUNTER — Encounter (HOSPITAL_COMMUNITY): Payer: Self-pay | Admitting: Pharmacy Technician

## 2014-08-20 ENCOUNTER — Ambulatory Visit (HOSPITAL_COMMUNITY)
Admission: RE | Admit: 2014-08-20 | Discharge: 2014-08-20 | Disposition: A | Discharge status: Home / Self Care | Payer: BC Managed Care – PPO | Source: Ambulatory Visit | Attending: Family Medicine | Admitting: Family Medicine

## 2014-08-20 DIAGNOSIS — Z87891 Personal history of nicotine dependence: Secondary | ICD-10-CM | POA: Insufficient documentation

## 2014-08-20 DIAGNOSIS — R221 Localized swelling, mass and lump, neck: Secondary | ICD-10-CM

## 2014-08-20 DIAGNOSIS — D119 Benign neoplasm of major salivary gland, unspecified: Secondary | ICD-10-CM | POA: Insufficient documentation

## 2014-08-20 DIAGNOSIS — K118 Other diseases of salivary glands: Secondary | ICD-10-CM | POA: Diagnosis present

## 2014-08-20 MED ORDER — LIDOCAINE HCL (PF) 1 % IJ SOLN
INTRAMUSCULAR | Status: AC
  Filled 2014-08-20: qty 10

## 2014-08-20 NOTE — Procedures (Signed)
Procedure:  US guided left parotid mass biopsy.   4x 25 FNA, 1 X 91P core No complications No significant blood loss. Stable to recovery.  Path specimen to path department with cyto tech Signed,  Johny Shears, DO

## 2014-08-20 NOTE — Progress Notes (Signed)
Left parotid/lymph node bx.  Agree with above

## 2014-08-20 NOTE — Progress Notes (Signed)
  Subjective: Left cervical lymph node enlargement Noticed 9 months ago Continued to enlarge and became painful 3 mo ago MD sent him to Dermatologist Then to ENT Then CT scan and [place] Now scheduled for biopsy per PMD  Smoker---quit x 3 days    Objective: Vital signs in last 24 hours:     Intake/Output from previous day:   Intake/Output this shift:   PE:  Lt neck: + swollen reddened area at LN site Tender to touch  Lab Results:  No results found for this basename: WBC, HGB, HCT, PLT,  in the last 72 hours BMET No results found for this basename: NA, K, CL, CO2, GLUCOSE, BUN, CREATININE, CALCIUM,  in the last 72 hours PT/INR No results found for this basename: LABPROT, INR,  in the last 72 hours ABG No results found for this basename: PHART, PCO2, PO2, HCO3,  in the last 72 hours  Studies/Results: No results found.  Anti-infectives: Anti-infectives   None      Assessment/Plan: s/p * No surgery found *  Enlarging L cervical LAN Painful scheduled now for biopsy of same Pt aware of procedure benefits and risks and agreeable to proceed Consent signed andin chart   LOS: 0 days    Janeya Deyo A 08/20/2014

## 2014-09-04 DIAGNOSIS — C801 Malignant (primary) neoplasm, unspecified: Secondary | ICD-10-CM

## 2014-09-04 HISTORY — DX: Malignant (primary) neoplasm, unspecified: C80.1

## 2014-09-23 ENCOUNTER — Encounter (HOSPITAL_BASED_OUTPATIENT_CLINIC_OR_DEPARTMENT_OTHER): Payer: Self-pay | Admitting: *Deleted

## 2014-09-23 ENCOUNTER — Encounter (HOSPITAL_BASED_OUTPATIENT_CLINIC_OR_DEPARTMENT_OTHER)
Admission: RE | Admit: 2014-09-23 | Discharge: 2014-09-23 | Disposition: A | Discharge status: Home / Self Care | Payer: BC Managed Care – PPO | Source: Ambulatory Visit | Attending: Plastic Surgery | Admitting: Plastic Surgery

## 2014-09-23 DIAGNOSIS — Z01812 Encounter for preprocedural laboratory examination: Secondary | ICD-10-CM | POA: Insufficient documentation

## 2014-09-23 LAB — BASIC METABOLIC PANEL
Anion gap: 15 (ref 5–15)
BUN: 11 mg/dL (ref 6–23)
CO2: 26 mEq/L (ref 19–32)
CREATININE: 1.04 mg/dL (ref 0.50–1.35)
Calcium: 10.2 mg/dL (ref 8.4–10.5)
Chloride: 97 mEq/L (ref 96–112)
GFR, EST NON AFRICAN AMERICAN: 80 mL/min — AB (ref >90)
Glucose, Bld: 139 mg/dL — ABNORMAL HIGH (ref 70–99)
POTASSIUM: 4.3 meq/L (ref 3.7–5.3)
Sodium: 138 mEq/L (ref 137–147)

## 2014-09-23 NOTE — Progress Notes (Signed)
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

## 2014-09-23 NOTE — Progress Notes (Signed)
09/23/14 1309  OBSTRUCTIVE SLEEP APNEA  Have you ever been diagnosed with sleep apnea through a sleep study? No  Do you snore loudly (loud enough to be heard through closed doors)?  0  Do you often feel tired, fatigued, or sleepy during the daytime? 0  Has anyone observed you stop breathing during your sleep? 0  Do you have, or are you being treated for high blood pressure? 1  BMI more than 35 kg/m2? 1  Age over 55 years old? 1  Neck circumference greater than 40 cm/16 inches? 1  Gender: 1  Obstructive Sleep Apnea Score 5  Score 4 or greater  Results sent to PCP

## 2014-09-23 NOTE — Progress Notes (Signed)
Spoke with Ivin Booty at Dr. Janeice Robinson office - glucose 139.- - will let Dr. Constance Holster know.

## 2014-09-24 NOTE — H&P (Signed)
  Assessment  History of Neck mass (784.2) (R22.1); Resolved: 13Oct2015. 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 Neck mass (784.2) (R22.1). Discussed  FNA consistent with Warthin's tumor. On exam, very large left inferior parotid mass, probably about 8 cm. Separate subcutaneous mass in the lower level V area, doughy, also about 8 or 9 cm. This is probably a sebaceous cyst. Recommend surgical excision of both of these. Details of parotid surgery were discussed. All questions were answered. Reason For Visit  Discuss  FNA results. Allergies  No Known Drug Allergies. Current Meds  Acetaminophen 500 MG Oral Tablet;; RPT Losartan Potassium 25 MG Oral Tablet;; RPT Atorvastatin Calcium 20 MG Oral Tablet;; RPT. Active Problems  Hyperlipidemia   (272.4) (E78.5) Hypertension   (401.9) (I10) Neck mass   (784.2) (R22.1). PSH  Rhinoplasty. Family Hx  No pertinent family history: Mother. Personal Hx  Current smoker (305.1) (F17.200). Signature  Electronically signed by : Izora Gala  M.D.; 09/16/2014 11:02 AM EST.

## 2014-09-29 ENCOUNTER — Ambulatory Visit (HOSPITAL_BASED_OUTPATIENT_CLINIC_OR_DEPARTMENT_OTHER): Payer: BC Managed Care – PPO | Admitting: Anesthesiology

## 2014-09-29 ENCOUNTER — Ambulatory Visit (HOSPITAL_BASED_OUTPATIENT_CLINIC_OR_DEPARTMENT_OTHER)
Admission: RE | Admit: 2014-09-29 | Discharge: 2014-09-30 | Disposition: A | Discharge status: Home / Self Care | Payer: BC Managed Care – PPO | Source: Ambulatory Visit | Attending: Otolaryngology | Admitting: Otolaryngology

## 2014-09-29 ENCOUNTER — Encounter (HOSPITAL_BASED_OUTPATIENT_CLINIC_OR_DEPARTMENT_OTHER): Payer: BC Managed Care – PPO | Admitting: Anesthesiology

## 2014-09-29 ENCOUNTER — Encounter (HOSPITAL_BASED_OUTPATIENT_CLINIC_OR_DEPARTMENT_OTHER): Payer: Self-pay | Admitting: *Deleted

## 2014-09-29 ENCOUNTER — Encounter (HOSPITAL_BASED_OUTPATIENT_CLINIC_OR_DEPARTMENT_OTHER)
Admission: RE | Disposition: A | Discharge status: Home / Self Care | Payer: Self-pay | Source: Ambulatory Visit | Attending: Otolaryngology

## 2014-09-29 DIAGNOSIS — R221 Localized swelling, mass and lump, neck: Secondary | ICD-10-CM | POA: Diagnosis present

## 2014-09-29 DIAGNOSIS — L720 Epidermal cyst: Secondary | ICD-10-CM | POA: Diagnosis not present

## 2014-09-29 DIAGNOSIS — F17200 Nicotine dependence, unspecified, uncomplicated: Secondary | ICD-10-CM | POA: Insufficient documentation

## 2014-09-29 DIAGNOSIS — D110 Benign neoplasm of parotid gland: Secondary | ICD-10-CM | POA: Insufficient documentation

## 2014-09-29 DIAGNOSIS — K118 Other diseases of salivary glands: Secondary | ICD-10-CM | POA: Diagnosis present

## 2014-09-29 DIAGNOSIS — I10 Essential (primary) hypertension: Secondary | ICD-10-CM | POA: Insufficient documentation

## 2014-09-29 HISTORY — DX: Essential (primary) hypertension: I10

## 2014-09-29 HISTORY — DX: Dental caries, unspecified: K02.9

## 2014-09-29 HISTORY — DX: Gastro-esophageal reflux disease without esophagitis: K21.9

## 2014-09-29 HISTORY — PX: MASS BIOPSY: SHX5445

## 2014-09-29 HISTORY — PX: PAROTIDECTOMY: SHX2163

## 2014-09-29 HISTORY — DX: Malignant (primary) neoplasm, unspecified: C80.1

## 2014-09-29 HISTORY — DX: Pneumonia, unspecified organism: J18.9

## 2014-09-29 LAB — POCT HEMOGLOBIN-HEMACUE: HEMOGLOBIN: 17.5 g/dL — AB (ref 13.0–17.0)

## 2014-09-29 SURGERY — EXCISION, PAROTID GLAND
Anesthesia: General | Site: Neck | Laterality: Left

## 2014-09-29 MED ORDER — CALCIUM CARBONATE ANTACID 500 MG PO CHEW: 1.0000 | CHEWABLE_TABLET | ORAL | Status: DC | PRN

## 2014-09-29 MED ORDER — HYDROMORPHONE HCL 1 MG/ML IJ SOLN
INTRAMUSCULAR | Status: AC
  Filled 2014-09-29: qty 1

## 2014-09-29 MED ORDER — LOSARTAN POTASSIUM 50 MG PO TABS: 50.0000 mg | ORAL_TABLET | Freq: Every day | ORAL | Status: DC

## 2014-09-29 MED ORDER — CEFAZOLIN SODIUM-DEXTROSE 2-3 GM-% IV SOLR: 2.0000 g | INTRAVENOUS | Status: DC

## 2014-09-29 MED ORDER — HYDROMORPHONE HCL 1 MG/ML IJ SOLN
0.2500 mg | INTRAMUSCULAR | Status: DC | PRN
  Administered 2014-09-29 (×3): 0.25 mg via INTRAVENOUS

## 2014-09-29 MED ORDER — CEFAZOLIN SODIUM-DEXTROSE 2-3 GM-% IV SOLR
INTRAVENOUS | Status: DC | PRN
  Administered 2014-09-29: 2 g via INTRAVENOUS

## 2014-09-29 MED ORDER — FENTANYL CITRATE 0.05 MG/ML IJ SOLN
INTRAMUSCULAR | Status: DC | PRN
  Administered 2014-09-29 (×2): 50 ug via INTRAVENOUS

## 2014-09-29 MED ORDER — CEPHALEXIN 500 MG PO CAPS: 500.0000 mg | ORAL_CAPSULE | Freq: Three times a day (TID) | ORAL | Status: DC

## 2014-09-29 MED ORDER — CEFAZOLIN SODIUM-DEXTROSE 2-3 GM-% IV SOLR
INTRAVENOUS | Status: AC
  Filled 2014-09-29: qty 50

## 2014-09-29 MED ORDER — ACETAMINOPHEN 500 MG PO TABS: 500.0000 mg | ORAL_TABLET | Freq: Four times a day (QID) | ORAL | Status: DC | PRN

## 2014-09-29 MED ORDER — FAMOTIDINE 10 MG PO TABS: 10.0000 mg | ORAL_TABLET | Freq: Every day | ORAL | Status: DC

## 2014-09-29 MED ORDER — HYDROCODONE-ACETAMINOPHEN 7.5-325 MG PO TABS: 1.0000 | ORAL_TABLET | Freq: Four times a day (QID) | ORAL | Status: DC | PRN

## 2014-09-29 MED ORDER — PROPOFOL 10 MG/ML IV BOLUS
INTRAVENOUS | Status: DC | PRN
  Administered 2014-09-29: 200 mg via INTRAVENOUS

## 2014-09-29 MED ORDER — HYDROCODONE-ACETAMINOPHEN 5-325 MG PO TABS
1.0000 | ORAL_TABLET | ORAL | Status: DC | PRN
  Administered 2014-09-29 – 2014-09-30 (×6): 2 via ORAL
  Filled 2014-09-29 (×6): qty 2

## 2014-09-29 MED ORDER — MIDAZOLAM HCL 2 MG/2ML IJ SOLN: 1.0000 mg | INTRAMUSCULAR | Status: DC | PRN

## 2014-09-29 MED ORDER — MIDAZOLAM HCL 5 MG/5ML IJ SOLN
INTRAMUSCULAR | Status: DC | PRN
  Administered 2014-09-29: 2 mg via INTRAVENOUS

## 2014-09-29 MED ORDER — PROMETHAZINE HCL 25 MG RE SUPP: 25.0000 mg | Freq: Four times a day (QID) | RECTAL | Status: DC | PRN

## 2014-09-29 MED ORDER — FENTANYL CITRATE 0.05 MG/ML IJ SOLN
INTRAMUSCULAR | Status: AC
  Filled 2014-09-29: qty 6

## 2014-09-29 MED ORDER — SUCCINYLCHOLINE CHLORIDE 20 MG/ML IJ SOLN
INTRAMUSCULAR | Status: DC | PRN
  Administered 2014-09-29: 50 mg via INTRAVENOUS

## 2014-09-29 MED ORDER — LIDOCAINE-EPINEPHRINE 1 %-1:100000 IJ SOLN
INTRAMUSCULAR | Status: AC
  Filled 2014-09-29: qty 2

## 2014-09-29 MED ORDER — LACTATED RINGERS IV SOLN
INTRAVENOUS | Status: DC
  Administered 2014-09-29 (×2): via INTRAVENOUS

## 2014-09-29 MED ORDER — MIDAZOLAM HCL 2 MG/2ML IJ SOLN
INTRAMUSCULAR | Status: AC
  Filled 2014-09-29: qty 2

## 2014-09-29 MED ORDER — PHENYLEPHRINE HCL 10 MG/ML IJ SOLN
INTRAMUSCULAR | Status: DC | PRN
  Administered 2014-09-29 (×8): 80 ug via INTRAVENOUS

## 2014-09-29 MED ORDER — FENTANYL CITRATE 0.05 MG/ML IJ SOLN: 50.0000 ug | INTRAMUSCULAR | Status: DC | PRN

## 2014-09-29 MED ORDER — OXYMETAZOLINE HCL 0.05 % NA SOLN
NASAL | Status: AC
  Filled 2014-09-29: qty 15

## 2014-09-29 MED ORDER — PROMETHAZINE HCL 25 MG PO TABS: 25.0000 mg | ORAL_TABLET | Freq: Four times a day (QID) | ORAL | Status: DC | PRN

## 2014-09-29 MED ORDER — ONDANSETRON HCL 4 MG/2ML IJ SOLN
INTRAMUSCULAR | Status: DC | PRN
  Administered 2014-09-29: 4 mg via INTRAVENOUS

## 2014-09-29 MED ORDER — OXYCODONE HCL 5 MG/5ML PO SOLN
5.0000 mg | Freq: Once | ORAL | Status: AC | PRN
Start: 2014-09-29 — End: 2014-09-29

## 2014-09-29 MED ORDER — LIDOCAINE HCL (CARDIAC) 20 MG/ML IV SOLN
INTRAVENOUS | Status: DC | PRN
  Administered 2014-09-29: 50 mg via INTRAVENOUS

## 2014-09-29 MED ORDER — DEXAMETHASONE SODIUM PHOSPHATE 4 MG/ML IJ SOLN
INTRAMUSCULAR | Status: DC | PRN
  Administered 2014-09-29: 10 mg via INTRAVENOUS

## 2014-09-29 MED ORDER — OXYCODONE HCL 5 MG PO TABS: 5.0000 mg | ORAL_TABLET | Freq: Once | ORAL | Status: AC | PRN

## 2014-09-29 MED ORDER — CEPHALEXIN 250 MG/5ML PO SUSR
500.0000 mg | Freq: Three times a day (TID) | ORAL | Status: DC
  Administered 2014-09-29 – 2014-09-30 (×3): 500 mg via ORAL
  Filled 2014-09-29 (×2): qty 10

## 2014-09-29 MED ORDER — DEXTROSE-NACL 5-0.9 % IV SOLN
INTRAVENOUS | Status: DC
  Administered 2014-09-29: 75 mL/h via INTRAVENOUS
  Administered 2014-09-29: via INTRAVENOUS

## 2014-09-29 MED ORDER — PROPOFOL 10 MG/ML IV BOLUS
INTRAVENOUS | Status: AC
  Filled 2014-09-29: qty 60

## 2014-09-29 SURGICAL SUPPLY — 80 items
ADH SKN CLS APL DERMABOND .7 (GAUZE/BANDAGES/DRESSINGS) ×2
APL SKNCLS STERI-STRIP NONHPOA (GAUZE/BANDAGES/DRESSINGS)
ATTRACTOMAT 16X20 MAGNETIC DRP (DRAPES) IMPLANT
BENZOIN TINCTURE PRP APPL 2/3 (GAUZE/BANDAGES/DRESSINGS) IMPLANT
BLADE SURG 15 STRL LF DISP TIS (BLADE) ×1 IMPLANT
BLADE SURG 15 STRL SS (BLADE) ×2
CANISTER SUCT 1200ML W/VALVE (MISCELLANEOUS) ×2 IMPLANT
CLEANER CAUTERY TIP 5X5 PAD (MISCELLANEOUS) ×1 IMPLANT
CLIP TI MEDIUM 6 (CLIP) IMPLANT
CLIP TI WIDE RED SMALL 6 (CLIP) IMPLANT
CORDS BIPOLAR (ELECTRODE) ×2 IMPLANT
COVER BACK TABLE 60X90IN (DRAPES) ×2 IMPLANT
COVER MAYO STAND STRL (DRAPES) ×2 IMPLANT
DERMABOND ADVANCED (GAUZE/BANDAGES/DRESSINGS) ×2
DERMABOND ADVANCED .7 DNX12 (GAUZE/BANDAGES/DRESSINGS) IMPLANT
DRAIN CHANNEL 15F RND FF W/TCR (WOUND CARE) IMPLANT
DRAIN JACKSON RD 7FR 3/32 (WOUND CARE) IMPLANT
DRAIN JP 10F RND SILICONE (MISCELLANEOUS) ×1 IMPLANT
DRAIN PENROSE 1/4X12 LTX STRL (WOUND CARE) IMPLANT
DRAIN TLS ROUND 10FR (DRAIN) IMPLANT
DRAPE INCISE 23X17 IOBAN STRL (DRAPES) ×1
DRAPE INCISE 23X17 STRL (DRAPES) ×1 IMPLANT
DRAPE INCISE IOBAN 23X17 STRL (DRAPES) ×1 IMPLANT
DRAPE U-SHAPE 76X120 STRL (DRAPES) ×2 IMPLANT
ELECT COATED BLADE 2.86 ST (ELECTRODE) ×2 IMPLANT
ELECT REM PT RETURN 9FT ADLT (ELECTROSURGICAL) ×2
ELECTRODE REM PT RTRN 9FT ADLT (ELECTROSURGICAL) ×1 IMPLANT
EVACUATOR SILICONE 100CC (DRAIN) ×1 IMPLANT
GAUZE SPONGE 4X4 16PLY XRAY LF (GAUZE/BANDAGES/DRESSINGS) ×2 IMPLANT
GLOVE BIO SURGEON STRL SZ 6.5 (GLOVE) ×1 IMPLANT
GLOVE BIO SURGEON STRL SZ7 (GLOVE) IMPLANT
GLOVE BIO SURGEON STRL SZ7.5 (GLOVE) IMPLANT
GLOVE BIOGEL PI IND STRL 7.0 (GLOVE) IMPLANT
GLOVE BIOGEL PI IND STRL 8 (GLOVE) IMPLANT
GLOVE BIOGEL PI INDICATOR 7.0 (GLOVE) ×2
GLOVE BIOGEL PI INDICATOR 8 (GLOVE)
GLOVE ECLIPSE 6.5 STRL STRAW (GLOVE) ×1 IMPLANT
GLOVE ECLIPSE 7.5 STRL STRAW (GLOVE) ×2 IMPLANT
GLOVE ECLIPSE 8.0 STRL XLNG CF (GLOVE) IMPLANT
GLOVE SS BIOGEL STRL SZ 7.5 (GLOVE) IMPLANT
GLOVE SUPERSENSE BIOGEL SZ 7.5 (GLOVE)
GOWN STRL REUS W/ TWL LRG LVL3 (GOWN DISPOSABLE) ×1 IMPLANT
GOWN STRL REUS W/ TWL XL LVL3 (GOWN DISPOSABLE) ×1 IMPLANT
GOWN STRL REUS W/TWL LRG LVL3 (GOWN DISPOSABLE) ×4
GOWN STRL REUS W/TWL XL LVL3 (GOWN DISPOSABLE) ×2
LOCATOR NERVE 3 VOLT (DISPOSABLE) IMPLANT
NDL HYPO 25X1 1.5 SAFETY (NEEDLE) IMPLANT
NEEDLE 27GAX1X1/2 (NEEDLE) IMPLANT
NEEDLE HYPO 25X1 1.5 SAFETY (NEEDLE) ×2 IMPLANT
NS IRRIG 1000ML POUR BTL (IV SOLUTION) ×2 IMPLANT
PACK BASIN DAY SURGERY FS (CUSTOM PROCEDURE TRAY) ×2 IMPLANT
PAD CLEANER CAUTERY TIP 5X5 (MISCELLANEOUS) ×1
PENCIL FOOT CONTROL (ELECTRODE) ×2 IMPLANT
PIN SAFETY STERILE (MISCELLANEOUS) IMPLANT
RUBBERBAND STERILE (MISCELLANEOUS) IMPLANT
SHEARS HARMONIC 9CM CVD (BLADE) ×2 IMPLANT
SHEET MEDIUM DRAPE 40X70 STRL (DRAPES) IMPLANT
SLEEVE SCD COMPRESS KNEE MED (MISCELLANEOUS) ×1 IMPLANT
SPONGE GAUZE 2X2 8PLY STRL LF (GAUZE/BANDAGES/DRESSINGS) IMPLANT
SPONGE GAUZE 4X4 12PLY STER LF (GAUZE/BANDAGES/DRESSINGS) IMPLANT
STAPLER VISISTAT 35W (STAPLE) ×1 IMPLANT
STRIP CLOSURE SKIN 1/2X4 (GAUZE/BANDAGES/DRESSINGS) IMPLANT
SUCTION FRAZIER TIP 10 FR DISP (SUCTIONS) IMPLANT
SUT CHROMIC 3 0 PS 2 (SUTURE) ×3 IMPLANT
SUT CHROMIC 4 0 P 3 18 (SUTURE) IMPLANT
SUT ETHILON 3 0 PS 1 (SUTURE) ×2 IMPLANT
SUT ETHILON 4 0 PS 2 18 (SUTURE) IMPLANT
SUT ETHILON 5 0 P 3 18 (SUTURE)
SUT NYLON ETHILON 5-0 P-3 1X18 (SUTURE) IMPLANT
SUT PLAIN 5 0 P 3 18 (SUTURE) IMPLANT
SUT SILK 3 0 PS 1 (SUTURE) ×4 IMPLANT
SUT SILK 3 0 SH CR/8 (SUTURE) IMPLANT
SUT SILK 4 0 TIES 17X18 (SUTURE) ×2 IMPLANT
SUT SURG 6 0 PRE1/P 10 (SUTURE) IMPLANT
SUT VICRYL 4-0 PS2 18IN ABS (SUTURE) IMPLANT
SYR BULB 3OZ (MISCELLANEOUS) ×2 IMPLANT
SYR CONTROL 10ML LL (SYRINGE) ×2 IMPLANT
TOWEL OR 17X24 6PK STRL BLUE (TOWEL DISPOSABLE) ×4 IMPLANT
TRAY DSU PREP LF (CUSTOM PROCEDURE TRAY) ×2 IMPLANT
TUBE CONNECTING 20X1/4 (TUBING) ×2 IMPLANT

## 2014-09-29 NOTE — Progress Notes (Signed)
Patient ID: [name], male   DOB: 1961-07-11, 53 y.o.   MRN: [id number] Complaining of soreness of left parotid area.  Having some desaturation, nurse is using supplemental oxygen.  Patient otherwise comfortable. AFVSS.  Alert.  NAD. Left parotidectomy site sunken, no hematoma, incision clean and intact except for minor drainage.  Drain functioning. Left supraclavicular incision clean and intact. Left facial movement normal. A/P: s/p left parotidectomy and removal of neck lipoma Observe overnight.  Drain in place.  Continue supplemental oxygen as needed.  I warned nurse to be careful not to give too much pain medication.

## 2014-09-29 NOTE — Transfer of Care (Signed)
Immediate Anesthesia Transfer of Care Note  Patient: [name]  Procedure(s) Performed: Procedure(s): LEFT PAROTIDECTOMY (Left) LEFT LOWER NECK MASS EXCISION (Left)  Patient Location: PACU  Anesthesia Type:General  Level of Consciousness: awake and patient cooperative  Airway & Oxygen Therapy: Patient Spontanous Breathing and Patient connected to face mask oxygen  Post-op Assessment: Report given to PACU RN and Post -op Vital signs reviewed and stable  Post vital signs: Reviewed and stable  Complications: No apparent anesthesia complications

## 2014-09-29 NOTE — Anesthesia Postprocedure Evaluation (Signed)
  Anesthesia Post-op Note  Patient: [name]  Procedure(s) Performed: Procedure(s): LEFT PAROTIDECTOMY (Left) LEFT LOWER NECK MASS EXCISION (Left)  Patient Location: PACU  Anesthesia Type:General  Level of Consciousness: awake and alert   Airway and Oxygen Therapy: Patient Spontanous Breathing  Post-op Pain: mild  Post-op Assessment: Post-op Vital signs reviewed, Patient's Cardiovascular Status Stable and Respiratory Function Stable  Post-op Vital Signs: Reviewed  Filed Vitals:   09/29/14 1110  BP: 146/92  Pulse: 102  Temp: 36.9 C  Resp: 16    Complications: No apparent anesthesia complications

## 2014-09-29 NOTE — Discharge Instructions (Signed)
It is OK to shower and use soap and water. Do not use any creams, oils, or ointments.

## 2014-09-29 NOTE — Anesthesia Procedure Notes (Signed)
Procedure Name: Intubation Date/Time: 09/29/2014 8:21 AM Performed by: Marrianne Mood Pre-anesthesia Checklist: Patient identified, Emergency Drugs available, Suction available, Patient being monitored and Timeout performed Patient Re-evaluated:Patient Re-evaluated prior to inductionOxygen Delivery Method: Circle System Utilized Preoxygenation: Pre-oxygenation with 100% oxygen Intubation Type: IV induction Ventilation: Mask ventilation without difficulty and Oral airway inserted - appropriate to patient size Laryngoscope Size: Miller and 3 Grade View: Grade III Tube type: Oral Tube size: 8.0 mm Number of attempts: 2 Airway Equipment and Method: stylet and oral airway Placement Confirmation: ETT inserted through vocal cords under direct vision,  positive ETCO2 and breath sounds checked- equal and bilateral Tube secured with: Tape Dental Injury: Teeth and Oropharynx as per pre-operative assessment  Difficulty Due To: Difficult Airway- due to dentition, Difficult Airway- due to anterior larynx and Difficult Airway-  due to edematous airway Comments: Laryngoscopy x 1, without visualization of cords.  Glide scope successful intubation x 1, with direct visualization of lower 1/4 of cords.  Clockwise twisting of ETT required to advance beyond cords.

## 2014-09-29 NOTE — Interval H&P Note (Signed)
History and Physical Interval Note:  09/29/2014 7:53 AM  [name]  has presented today for surgery, with the diagnosis of left paratoid mass  The various methods of treatment have been discussed with the patient and family. After consideration of risks, benefits and other options for treatment, the patient has consented to  Procedure(s): LEFT PAROTIDECTOMY (Left) LEFT LOWER NECK MASS EXCISION (Left) as a surgical intervention .  The patient's history has been reviewed, patient examined, no change in status, stable for surgery.  I have reviewed the patient's chart and labs.  Questions were answered to the patient's satisfaction.     Naylin Burkle

## 2014-09-29 NOTE — Progress Notes (Signed)
Patient evaluated at bedside by Dr. Redmond Baseman. I notified MD of patients pain level. Patient c/o soreness to incisions 5/10. Patient appears comfortable aeb pain ad of 0/10. Will continue po and non-phagological pain regimen per MD as pt has desaturation with IV pain med's.

## 2014-09-29 NOTE — Anesthesia Preprocedure Evaluation (Signed)
Anesthesia Evaluation  Patient identified by MRN, date of birth, ID band Patient awake    Reviewed: Allergy & Precautions, H&P , NPO status , Patient's Chart, lab work & pertinent test results  Airway Mallampati: I TM Distance: >3 FB Neck ROM: Full    Dental  (+) Teeth Intact, Dental Advisory Given   Pulmonary Current Smoker,  breath sounds clear to auscultation        Cardiovascular hypertension, Pt. on medications Rhythm:Regular Rate:Normal     Neuro/Psych    GI/Hepatic GERD-  Medicated and Controlled,  Endo/Other  Morbid obesity  Renal/GU      Musculoskeletal   Abdominal   Peds  Hematology   Anesthesia Other Findings   Reproductive/Obstetrics                           Anesthesia Physical Anesthesia Plan  ASA: II  Anesthesia Plan: General   Post-op Pain Management:    Induction: Intravenous  Airway Management Planned: Oral ETT  Additional Equipment:   Intra-op Plan:   Post-operative Plan: Extubation in OR  Informed Consent: I have reviewed the patients History and Physical, chart, labs and discussed the procedure including the risks, benefits and alternatives for the proposed anesthesia with the patient or authorized representative who has indicated his/her understanding and acceptance.   Dental advisory given  Plan Discussed with: CRNA, Anesthesiologist and Surgeon  Anesthesia Plan Comments:         Anesthesia Quick Evaluation

## 2014-09-29 NOTE — Op Note (Signed)
OPERATIVE REPORT  DATE OF SURGERY: 09/29/2014  PATIENT:  [name],  53 y.o. 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 Left parotid mass, 2. Left lower neck mass  COUNTS:  Correct  PROCEDURE DETAILS: The patient was taken to the operating room and placed on the operating table in the supine position. Following induction of general endotracheal anesthesia, the left neck was prepped and draped in a standard fashion.  1. Left superficial parotidectomy. A preauricular incision was outlined with a marking pen with extension down around the mastoid. Cautery was used to incise the skin and subcutaneous tissue. The greater auricular nerve was identified and preserved and reflected posteriorly. Skin flap was developed anteriorly. Stay sutures were used on the skin flap and the earlobe. The parotid gland was reflected anteriorly and dissection down along the ear canal cartilage was performed. The digastric muscle was identified. The main trunk of the facial nerve was identified. Careful dissection on top of the nerve was then accomplished starting with the superior division. All branches were identified and preserved. The harmonic dissector was used to divide the parotid tissue. 4-0 silk ties were used for larger vessels. The superficial lobe of the gland was brought inferiorly. The cystic mass was contained within the inferior aspect of the gland. The cyst was blue and there quite large extending somewhat deep to the plane of the nerve but inferior to the nerve. The entire mass was taken with the lateral lobe of the gland and sent for pathologic  evaluation. Hemostasis was completed. The nerve was all intact. The wound was irrigated with saline and suctioned. A 10 French drain was exited through a separate stab incision and secured in place with nylon suture. The subcutaneous particular running chromic was used for closure and Dermabond on the skin. Drain was discharged.  2. Left lower neck mass excision. Incision was outlined transversely across the length of the mass. The mass itself was about 6 x 10 cm. It was doughy and subcutaneous. Incision was created and careful sharp dissection down to the capsule of the cyst was performed. The entire cyst was dissected free from surrounding tissue. The cyst wall was violated in a couple places and thick cheesy white material was obtained. Entire cyst was removed and sent for pathologic evaluation. The wound was copiously irrigated with saline. Hemostasis was completed using electrocautery and bipolar cautery. Closure was accomplished with interrupted 3-0 chromic suture encompassing the subcuticular layer and the deep muscular layer to close off any dead space. Dermabond was used on the skin.  The patient was awakened from  anesthesia, extubated and transferred to recovery in stable condition.    PATIENT DISPOSITION:  To PACU, stable

## 2014-09-30 ENCOUNTER — Encounter (HOSPITAL_BASED_OUTPATIENT_CLINIC_OR_DEPARTMENT_OTHER): Payer: Self-pay | Admitting: Otolaryngology

## 2014-09-30 DIAGNOSIS — D110 Benign neoplasm of parotid gland: Secondary | ICD-10-CM | POA: Diagnosis not present

## 2014-09-30 NOTE — Discharge Summary (Signed)
  Physician Discharge Summary  Patient ID: [name] MRN: [id number] DOB/AGE: 1961/08/21 53 y.o.  Admit date: 09/29/2014 Discharge date: 09/30/2014  Admission Diagnoses:parotid and lower neck masses  Discharge Diagnoses:  Active Problems:   Parotid mass   Discharged Condition: good  Hospital Course: no complications  Consults: none  Significant Diagnostic Studies: none  Treatments: surgery: parotidectomy and excision neck mass  Discharge Exam: Blood pressure 152/90, pulse 82, temperature 97.6 F (36.4 C), temperature source Oral, resp. rate 16, height 5\' 10"  (1.778 m), weight 115.214 kg (254 lb), SpO2 97.00%. PHYSICAL EXAM: Incisions intact, no swelling, FN with normal function. JP removed.  Disposition:      Medication List         acetaminophen 500 MG tablet  Commonly known as:  TYLENOL  Take 500 mg by mouth every 6 (six) hours as needed for headache.     cephALEXin 500 MG capsule  Commonly known as:  KEFLEX  Take 1 capsule (500 mg total) by mouth 3 (three) times daily.     HYDROcodone-acetaminophen 7.5-325 MG per tablet  Commonly known as:  NORCO  Take 1 tablet by mouth every 6 (six) hours as needed for moderate pain.     losartan 50 MG tablet  Commonly known as:  COZAAR  Take 1 tablet (50 mg total) by mouth daily.     promethazine 25 MG suppository  Commonly known as:  PHENERGAN  Place 1 suppository (25 mg total) rectally every 6 (six) hours as needed for nausea or vomiting.     ranitidine 150 MG tablet  Commonly known as:  ZANTAC  Take 150 mg by mouth as needed for heartburn.     TUMS 500 MG chewable tablet  Generic drug:  calcium carbonate  Chew 1 tablet by mouth as needed for indigestion or heartburn.           Follow-up Information   Follow up with Izora Gala, MD In 1 week.   Specialty:  Otolaryngology   Contact information:   [address] [place] [address] [phone]       Signed: Izora Gala 09/30/2014, 8:40 AM

## 2014-12-24 ENCOUNTER — Other Ambulatory Visit: Payer: Self-pay

## 2014-12-24 MED ORDER — LOSARTAN POTASSIUM 50 MG PO TABS: 50.0000 mg | ORAL_TABLET | Freq: Every day | ORAL | Status: DC

## 2016-02-29 ENCOUNTER — Other Ambulatory Visit: Payer: Self-pay | Admitting: Cardiology

## 2016-04-04 DIAGNOSIS — M549 Dorsalgia, unspecified: Secondary | ICD-10-CM | POA: Diagnosis not present

## 2016-04-04 DIAGNOSIS — R0789 Other chest pain: Secondary | ICD-10-CM | POA: Diagnosis not present

## 2016-04-05 ENCOUNTER — Ambulatory Visit
Admission: RE | Admit: 2016-04-05 | Discharge: 2016-04-05 | Disposition: A | Discharge status: Home / Self Care | Payer: BLUE CROSS/BLUE SHIELD | Source: Ambulatory Visit | Attending: Family Medicine | Admitting: Family Medicine

## 2016-04-05 ENCOUNTER — Other Ambulatory Visit: Payer: Self-pay | Admitting: Family Medicine

## 2016-04-05 DIAGNOSIS — R05 Cough: Secondary | ICD-10-CM | POA: Diagnosis not present

## 2016-04-05 DIAGNOSIS — R0602 Shortness of breath: Secondary | ICD-10-CM | POA: Diagnosis not present

## 2016-04-05 DIAGNOSIS — R0789 Other chest pain: Secondary | ICD-10-CM

## 2016-06-06 DIAGNOSIS — C61 Malignant neoplasm of prostate: Secondary | ICD-10-CM | POA: Diagnosis not present

## 2016-06-27 DIAGNOSIS — I10 Essential (primary) hypertension: Secondary | ICD-10-CM | POA: Diagnosis not present

## 2016-06-27 DIAGNOSIS — Z1211 Encounter for screening for malignant neoplasm of colon: Secondary | ICD-10-CM | POA: Diagnosis not present

## 2016-06-27 DIAGNOSIS — Z0000 Encounter for general adult medical examination without abnormal findings: Secondary | ICD-10-CM | POA: Diagnosis not present

## 2016-06-27 DIAGNOSIS — E785 Hyperlipidemia, unspecified: Secondary | ICD-10-CM | POA: Diagnosis not present

## 2016-09-06 DIAGNOSIS — Z1211 Encounter for screening for malignant neoplasm of colon: Secondary | ICD-10-CM | POA: Diagnosis not present

## 2016-12-26 DIAGNOSIS — E785 Hyperlipidemia, unspecified: Secondary | ICD-10-CM | POA: Diagnosis not present

## 2016-12-26 DIAGNOSIS — C61 Malignant neoplasm of prostate: Secondary | ICD-10-CM | POA: Diagnosis not present

## 2016-12-26 DIAGNOSIS — Z720 Tobacco use: Secondary | ICD-10-CM | POA: Diagnosis not present

## 2016-12-26 DIAGNOSIS — R7309 Other abnormal glucose: Secondary | ICD-10-CM | POA: Diagnosis not present

## 2016-12-26 DIAGNOSIS — I10 Essential (primary) hypertension: Secondary | ICD-10-CM | POA: Diagnosis not present

## 2017-01-02 DIAGNOSIS — R351 Nocturia: Secondary | ICD-10-CM | POA: Diagnosis not present

## 2017-01-02 DIAGNOSIS — C61 Malignant neoplasm of prostate: Secondary | ICD-10-CM | POA: Diagnosis not present

## 2017-01-02 DIAGNOSIS — N401 Enlarged prostate with lower urinary tract symptoms: Secondary | ICD-10-CM | POA: Diagnosis not present

## 2017-01-17 DIAGNOSIS — R05 Cough: Secondary | ICD-10-CM | POA: Diagnosis not present

## 2017-01-17 DIAGNOSIS — J111 Influenza due to unidentified influenza virus with other respiratory manifestations: Secondary | ICD-10-CM | POA: Diagnosis not present

## 2017-02-17 ENCOUNTER — Ambulatory Visit: Payer: BLUE CROSS/BLUE SHIELD | Admitting: Registered"

## 2017-02-27 ENCOUNTER — Encounter: Payer: BLUE CROSS/BLUE SHIELD | Attending: Family Medicine | Admitting: Dietician

## 2017-02-27 ENCOUNTER — Encounter: Payer: Self-pay | Admitting: Dietician

## 2017-02-27 DIAGNOSIS — E119 Type 2 diabetes mellitus without complications: Secondary | ICD-10-CM | POA: Insufficient documentation

## 2017-02-27 DIAGNOSIS — Z713 Dietary counseling and surveillance: Secondary | ICD-10-CM | POA: Insufficient documentation

## 2017-02-27 DIAGNOSIS — Z6837 Body mass index (BMI) 37.0-37.9, adult: Secondary | ICD-10-CM | POA: Diagnosis not present

## 2017-02-27 NOTE — Patient Instructions (Addendum)
Stay as active as possible. Find ways to add this daily. When eating out, choose baked or grilled rather than fried, watch portions, dressing on the side. Rethink what you drink.  Aim for beverages without carbohydrates. Increase your non starchy vegetables.

## 2017-02-27 NOTE — Progress Notes (Signed)
  Medical Nutrition Therapy:  Appt start time: 0810 end time:  0920.   Assessment:  Primary concerns today: Patient is here alone.  He would like to lose weight and has been recently diagnosed with diabetes.  His A1C was 6.9% 12/26/16.  Other hx includes smoking, hyperlipidemia, GERD, HTN, and prostate cancer which has improved.  Weight today 265 lbs.  High weight of 270 lbs.  UBW 180-200 lbs prior to his wife dying and starting trucking.  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  His wife died when he was 56 yo.  He now shares a mobile home with his trucking partner and is only home 1 1/2 days per week.  He eats almost all meals out.  Preferred Learning Style:   No preference indicated   Learning Readiness:   Ready  Change in progress   MEDICATIONS: see list   DIETARY INTAKE: Used to drink 8 soda per day and has reduced to 2 per week. 24-hr recall:  B ( AM): ham and cheese omelet, bacon, 2 white toast with butter when home OR bacon, egg, and cheese biscuit or sausage muffin OR 2 eggs, bacon, toast or biscuits Snk ( AM): crackers or chips or cookies  L ( PM): Subway or burger or fish sandwich Snk ( PM): occasional chips or cookies D ( PM): grilled chicken, mashed potatoes, cornbread OR hot dogs OR [place] tropido's at the truck stop OR pork chop dinner OR soup and salad Snk ( PM): Lance Crackers Beverages: water, Gatorade (2 per week), soda (2 per week), sweet or unsweetened tea rare  Usual physical activity: limited walking  Estimated energy needs: 2000 calories 225 g carbohydrates 150 g protein 56 g fat  Progress Towards Goal(s):  In progress.   Nutritional Diagnosis:  NB-1.1 Food and nutrition-related knowledge deficit As related to balance of carbohydrate, protein, and fat.  As evidenced by diet hx and patient report.    Intervention:  Nutrition counseling and diabetes education initiated. Discussed Carb Counting by food group as method of  portion control, reading food labels, and benefits of increased activity. Also discussed basic physiology of Diabetes, target BG ranges pre and post meals, and A1c. Spent a lot of time discussing eating out, healthy snacks, and how to incorporate exercise in his day.  Stay as active as possible. Find ways to add this daily. When eating out, choose baked or grilled rather than fried, watch portions, dressing on the side. Rethink what you drink.  Aim for beverages without carbohydrates. Increase your non starchy vegetables.  Teaching Method Utilized:  Visual Auditory Hands on  Handouts given during visit include: Living Well with Diabetes Food Label handouts Meal Plan Card  My plate  Label reading  Barriers to learning/adherence to lifestyle change: long distance truck driver and eats out all of the time  Demonstrated degree of understanding via:  Teach Back   Monitoring/Evaluation:  Dietary intake, exercise, label reading, and body weight prn.

## 2017-06-26 DIAGNOSIS — C61 Malignant neoplasm of prostate: Secondary | ICD-10-CM | POA: Diagnosis not present

## 2017-10-30 DIAGNOSIS — E119 Type 2 diabetes mellitus without complications: Secondary | ICD-10-CM | POA: Diagnosis not present

## 2017-10-30 DIAGNOSIS — Z1211 Encounter for screening for malignant neoplasm of colon: Secondary | ICD-10-CM | POA: Diagnosis not present

## 2017-10-30 DIAGNOSIS — R351 Nocturia: Secondary | ICD-10-CM | POA: Diagnosis not present

## 2017-10-30 DIAGNOSIS — Z0000 Encounter for general adult medical examination without abnormal findings: Secondary | ICD-10-CM | POA: Diagnosis not present

## 2017-10-30 DIAGNOSIS — E785 Hyperlipidemia, unspecified: Secondary | ICD-10-CM | POA: Diagnosis not present

## 2017-10-30 DIAGNOSIS — N401 Enlarged prostate with lower urinary tract symptoms: Secondary | ICD-10-CM | POA: Diagnosis not present

## 2017-10-30 DIAGNOSIS — C61 Malignant neoplasm of prostate: Secondary | ICD-10-CM | POA: Diagnosis not present

## 2017-12-25 DIAGNOSIS — C61 Malignant neoplasm of prostate: Secondary | ICD-10-CM | POA: Diagnosis not present

## 2018-05-03 DIAGNOSIS — I10 Essential (primary) hypertension: Secondary | ICD-10-CM | POA: Diagnosis not present

## 2018-05-03 DIAGNOSIS — R1310 Dysphagia, unspecified: Secondary | ICD-10-CM | POA: Diagnosis not present

## 2018-05-03 DIAGNOSIS — E119 Type 2 diabetes mellitus without complications: Secondary | ICD-10-CM | POA: Diagnosis not present

## 2018-05-03 DIAGNOSIS — E785 Hyperlipidemia, unspecified: Secondary | ICD-10-CM | POA: Diagnosis not present

## 2018-05-07 ENCOUNTER — Other Ambulatory Visit: Payer: Self-pay | Admitting: Family Medicine

## 2018-05-07 DIAGNOSIS — N5089 Other specified disorders of the male genital organs: Secondary | ICD-10-CM

## 2018-05-11 ENCOUNTER — Ambulatory Visit
Admission: RE | Admit: 2018-05-11 | Discharge: 2018-05-11 | Disposition: A | Discharge status: Home / Self Care | Payer: BLUE CROSS/BLUE SHIELD | Source: Ambulatory Visit | Attending: Family Medicine | Admitting: Family Medicine

## 2018-05-11 DIAGNOSIS — N503 Cyst of epididymis: Secondary | ICD-10-CM | POA: Diagnosis not present

## 2018-05-11 DIAGNOSIS — N5089 Other specified disorders of the male genital organs: Secondary | ICD-10-CM

## 2018-06-01 DIAGNOSIS — R1311 Dysphagia, oral phase: Secondary | ICD-10-CM | POA: Diagnosis not present

## 2018-06-11 DIAGNOSIS — K222 Esophageal obstruction: Secondary | ICD-10-CM | POA: Diagnosis not present

## 2018-06-11 DIAGNOSIS — R1310 Dysphagia, unspecified: Secondary | ICD-10-CM | POA: Diagnosis not present

## 2018-06-11 DIAGNOSIS — K449 Diaphragmatic hernia without obstruction or gangrene: Secondary | ICD-10-CM | POA: Diagnosis not present

## 2018-06-11 DIAGNOSIS — C61 Malignant neoplasm of prostate: Secondary | ICD-10-CM | POA: Diagnosis not present

## 2018-09-02 DIAGNOSIS — R1032 Left lower quadrant pain: Secondary | ICD-10-CM | POA: Diagnosis not present

## 2018-10-29 DIAGNOSIS — L03313 Cellulitis of chest wall: Secondary | ICD-10-CM | POA: Diagnosis not present

## 2018-11-06 DIAGNOSIS — L723 Sebaceous cyst: Secondary | ICD-10-CM | POA: Diagnosis not present

## 2018-11-07 ENCOUNTER — Other Ambulatory Visit: Payer: Self-pay | Admitting: General Surgery

## 2018-11-07 DIAGNOSIS — L089 Local infection of the skin and subcutaneous tissue, unspecified: Secondary | ICD-10-CM | POA: Diagnosis not present

## 2018-11-07 DIAGNOSIS — L720 Epidermal cyst: Secondary | ICD-10-CM | POA: Diagnosis not present

## 2018-11-07 DIAGNOSIS — L723 Sebaceous cyst: Secondary | ICD-10-CM | POA: Diagnosis not present

## 2018-12-18 DIAGNOSIS — L089 Local infection of the skin and subcutaneous tissue, unspecified: Secondary | ICD-10-CM | POA: Diagnosis not present

## 2018-12-18 DIAGNOSIS — L723 Sebaceous cyst: Secondary | ICD-10-CM | POA: Diagnosis not present

## 2018-12-25 DIAGNOSIS — E785 Hyperlipidemia, unspecified: Secondary | ICD-10-CM | POA: Diagnosis not present

## 2018-12-25 DIAGNOSIS — E119 Type 2 diabetes mellitus without complications: Secondary | ICD-10-CM | POA: Diagnosis not present

## 2018-12-25 DIAGNOSIS — Z0000 Encounter for general adult medical examination without abnormal findings: Secondary | ICD-10-CM | POA: Diagnosis not present

## 2018-12-25 DIAGNOSIS — C61 Malignant neoplasm of prostate: Secondary | ICD-10-CM | POA: Diagnosis not present

## 2019-01-01 DIAGNOSIS — C61 Malignant neoplasm of prostate: Secondary | ICD-10-CM | POA: Diagnosis not present

## 2019-01-03 ENCOUNTER — Other Ambulatory Visit: Payer: Self-pay | Admitting: Urology

## 2019-01-03 DIAGNOSIS — R972 Elevated prostate specific antigen [PSA]: Secondary | ICD-10-CM

## 2019-01-15 ENCOUNTER — Ambulatory Visit
Admission: RE | Admit: 2019-01-15 | Discharge: 2019-01-15 | Disposition: A | Discharge status: Home / Self Care | Payer: BLUE CROSS/BLUE SHIELD | Source: Ambulatory Visit | Attending: Urology | Admitting: Urology

## 2019-01-15 DIAGNOSIS — R972 Elevated prostate specific antigen [PSA]: Secondary | ICD-10-CM

## 2019-01-15 MED ORDER — GADOBENATE DIMEGLUMINE 529 MG/ML IV SOLN
20.0000 mL | Freq: Once | INTRAVENOUS | Status: AC | PRN
  Administered 2019-01-15: 20 mL via INTRAVENOUS

## 2019-03-12 DIAGNOSIS — C61 Malignant neoplasm of prostate: Secondary | ICD-10-CM | POA: Diagnosis not present

## 2019-03-21 DIAGNOSIS — C61 Malignant neoplasm of prostate: Secondary | ICD-10-CM | POA: Diagnosis not present

## 2019-03-26 DIAGNOSIS — E119 Type 2 diabetes mellitus without complications: Secondary | ICD-10-CM | POA: Diagnosis not present

## 2019-07-16 DIAGNOSIS — K635 Polyp of colon: Secondary | ICD-10-CM | POA: Diagnosis not present

## 2019-07-16 DIAGNOSIS — D122 Benign neoplasm of ascending colon: Secondary | ICD-10-CM | POA: Diagnosis not present

## 2019-07-16 DIAGNOSIS — Z1211 Encounter for screening for malignant neoplasm of colon: Secondary | ICD-10-CM | POA: Diagnosis not present

## 2019-07-16 DIAGNOSIS — D125 Benign neoplasm of sigmoid colon: Secondary | ICD-10-CM | POA: Diagnosis not present

## 2019-07-19 DIAGNOSIS — G479 Sleep disorder, unspecified: Secondary | ICD-10-CM | POA: Diagnosis not present

## 2019-07-19 DIAGNOSIS — C61 Malignant neoplasm of prostate: Secondary | ICD-10-CM | POA: Diagnosis not present

## 2019-07-19 DIAGNOSIS — I10 Essential (primary) hypertension: Secondary | ICD-10-CM | POA: Diagnosis not present

## 2019-07-19 DIAGNOSIS — E785 Hyperlipidemia, unspecified: Secondary | ICD-10-CM | POA: Diagnosis not present

## 2019-07-19 DIAGNOSIS — E1169 Type 2 diabetes mellitus with other specified complication: Secondary | ICD-10-CM | POA: Diagnosis not present

## 2019-08-29 ENCOUNTER — Encounter: Payer: Self-pay | Admitting: Pulmonary Disease

## 2019-09-03 DIAGNOSIS — R972 Elevated prostate specific antigen [PSA]: Secondary | ICD-10-CM | POA: Diagnosis not present

## 2019-09-04 DIAGNOSIS — R0681 Apnea, not elsewhere classified: Secondary | ICD-10-CM | POA: Diagnosis not present

## 2019-09-06 ENCOUNTER — Encounter: Payer: Self-pay | Admitting: Pulmonary Disease

## 2019-09-06 ENCOUNTER — Ambulatory Visit: Payer: BC Managed Care – PPO | Admitting: Pulmonary Disease

## 2019-09-06 ENCOUNTER — Other Ambulatory Visit: Payer: Self-pay

## 2019-09-06 ENCOUNTER — Ambulatory Visit (INDEPENDENT_AMBULATORY_CARE_PROVIDER_SITE_OTHER): Payer: BC Managed Care – PPO

## 2019-09-06 VITALS — BP 128/72 | HR 90 | Temp 98.3°F | Ht 70.0 in | Wt 273.2 lb

## 2019-09-06 DIAGNOSIS — Z7689 Persons encountering health services in other specified circumstances: Secondary | ICD-10-CM | POA: Diagnosis not present

## 2019-09-06 DIAGNOSIS — Z87891 Personal history of nicotine dependence: Secondary | ICD-10-CM

## 2019-09-06 DIAGNOSIS — R0683 Snoring: Secondary | ICD-10-CM

## 2019-09-06 DIAGNOSIS — J449 Chronic obstructive pulmonary disease, unspecified: Secondary | ICD-10-CM | POA: Diagnosis not present

## 2019-09-06 NOTE — Patient Instructions (Signed)
Chest xray today  Will schedule pulmonary function test  Follow up in 6 to 8 weeks

## 2019-09-06 NOTE — Progress Notes (Signed)
[facility]  Chief Complaint  Patient presents with  . Consult    Evaluation for Obstructive Sleep Apnea. Had consultation for sleep study yesterday that was ordered by Dr. Orland Mustard.    Constitutional:  BP 128/72 (BP Location: Right Arm, Patient Position: Sitting, Cuff Size: Normal)   Pulse 90   Temp 98.3 F (36.8 C)   Ht 5\' 10"  (1.778 m)   Wt 273 lb 3.2 oz (123.9 kg)   SpO2 98% Comment: on room air  BMI 39.20 kg/m   Past Medical History:  PNA, HTN, GERD, Prostate cancer, HLD, DM  Brief Summary:  [name] is a 58 y.o. male former smoker with concern for COPD.  He had colonoscopy recently with Dr. Watt Climes.  Was told he had trouble with his breathing and oxygen during the procedure.  He has extensive prior history of cigarette smoking and quit 2 years ago.  He was told that he needed assessment for COPD and sleep apnea.  He was seen by Dr. Maxwell Caul for sleep apnea assessment and has home sleep study scheduled.  He gets winded if the weather is hot and humidity.  Also gets winded when he does activities at elevation.  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  He has occasional cough.  He is not having wheezing, chest pain, sputum, fever, leg swelling, gland swelling, or hemoptysis.  He had pneumonia years ago.  No history of TB.  No animal/bird exposures.     Physical Exam:   Appearance - well kempt   ENMT - clear nasal mucosa, midline nasal  septum, no oral exudates, no LAN, trachea midline  Respiratory - normal chest wall, normal respiratory effort, no accessory muscle use, no wheeze/rales  CV - s1s2 regular rate and rhythm, no murmurs, no peripheral edema, radial pulses symmetric  GI - soft, non tender, no masses  Lymph - no adenopathy noted in neck and axillary areas  MSK - normal gait  Ext - no cyanosis, clubbing, or joint inflammation noted  Skin - no rashes, lesions, or  ulcers  Neuro - normal strength, oriented x 3  Psych - normal mood and affect   Assessment/Plan:   History of tobacco abuse with concern for COPD. - will arrange for chest xray and pulmonary function test  Snoring with concern for obstructive sleep apnea. - he has home sleep study scheduled  Obesity. - discussed how his weight could be impacting his health - discussed options to assist with weight loss   Patient Instructions  Chest xray today  Will schedule pulmonary function test  Follow up in 6 to 8 weeks   A total of  37 minutes were spent face to face with the patient and more than half of that time involved counseling or coordination of care.   Chesley Mires, MD [facility] Pager: [phone] 09/06/2019, 10:03 AM  Flow Sheet    Pulmonary tests:    Review of Systems:  Constitutional: Negative.   HENT: Positive for hearing loss and voice change.   Eyes: Positive for redness and itching.  Respiratory: Negative.   Cardiovascular: Negative.   Gastrointestinal: Negative.   Endocrine: Negative.   Genitourinary: Positive for frequency and urgency.  Musculoskeletal: Positive for neck stiffness.  Skin: Negative.   Allergic/Immunologic: Positive for environmental allergies.  Neurological: Positive for dizziness.  Hematological: Negative.   Psychiatric/Behavioral: Negative.    Medications:   Allergies as of 09/06/2019  No Known Allergies     Medication List       Accurate as of September 06, 2019 10:03 AM. If you have any questions, ask your nurse or doctor.        STOP taking these medications   cephALEXin 500 MG capsule Commonly known as: Keflex Stopped by: Chesley Mires, MD   HYDROcodone-acetaminophen 7.5-325 MG tablet Commonly known as: Norco Stopped by: Chesley Mires, MD   promethazine 25 MG suppository Commonly known as: PHENERGAN Stopped by: Chesley Mires, MD   ranitidine 150 MG tablet Commonly known as: ZANTAC Stopped by:  Chesley Mires, MD     TAKE these medications   acetaminophen 500 MG tablet Commonly known as: TYLENOL Take 500 mg by mouth every 6 (six) hours as needed for headache.   amLODipine 5 MG tablet Commonly known as: NORVASC Take 5 mg by mouth daily.   losartan 50 MG tablet Commonly known as: COZAAR TAKE ONE TABLET BY MOUTH ONCE DAILY   metFORMIN 500 MG tablet Commonly known as: GLUCOPHAGE Take 1 tablet by mouth 2 (two) times daily.   pantoprazole 40 MG tablet Commonly known as: PROTONIX Take 1 tablet by mouth daily.   rosuvastatin 10 MG tablet Commonly known as: CRESTOR Take 10 mg by mouth daily.   Tums 500 MG chewable tablet Generic drug: calcium carbonate Chew 1 tablet by mouth as needed for indigestion or heartburn.       Past Surgical History:  He  has a past surgical history that includes Septoplasty (1976); Parotidectomy (Left, 09/29/2014); and Mass biopsy (Left, 09/29/2014).  Family History:  His family history includes Heart disease in his father; Stroke in his mother.  Social History:  He  reports that he has been smoking cigarettes. He has been smoking about 0.50 packs per day. He has never used smokeless tobacco. He reports current alcohol use. He reports that he does not use drugs.

## 2019-09-06 NOTE — Progress Notes (Signed)
  Subjective:     Patient ID: [name], male   DOB: [birth year], 58 y.o.   MRN: [id number]  HPI   Review of Systems  Constitutional: Negative.   HENT: Positive for hearing loss and voice change.   Eyes: Positive for redness and itching.  Respiratory: Negative.   Cardiovascular: Negative.   Gastrointestinal: Negative.   Endocrine: Negative.   Genitourinary: Positive for frequency and urgency.  Musculoskeletal: Positive for neck stiffness.  Skin: Negative.   Allergic/Immunologic: Positive for environmental allergies.  Neurological: Positive for dizziness.  Hematological: Negative.   Psychiatric/Behavioral: Negative.        Objective:   Physical Exam     Assessment:        Plan:

## 2019-09-20 ENCOUNTER — Telehealth: Payer: Self-pay | Admitting: Pulmonary Disease

## 2019-09-20 NOTE — Telephone Encounter (Signed)
Pt returning call.  [phone]

## 2019-09-20 NOTE — Telephone Encounter (Signed)
  CLINICAL DATA:  COPD and history of tobacco abuse.  Hx HTN.  EXAM: CHEST - 2 VIEW  COMPARISON:  Chest radiograph 04/05/2016  FINDINGS: Stable cardiomediastinal contours. Redemonstrated scarring in the lingula. No new focal pulmonary opacity. Slight coarsening of the interstitium bilaterally likely related to history of smoking. No pneumothorax or pleural effusion. Degenerative changes are seen throughout the thoracic spine.  IMPRESSION: No acute cardiopulmonary process.   Electronically Signed   By: Audie Pinto M.D.   On: 09/06/2019 11:45   Please let him know that his chest xray shows chronic changes related to history of smoking.  Will need to correlate with pulmonary function test findings to determine if any additional therapies are needed.  PFT has already been ordered.

## 2019-09-20 NOTE — Telephone Encounter (Signed)
LVMTCB x 1 for patient. 

## 2019-09-20 NOTE — Telephone Encounter (Signed)
Advised pt of results. Pt understood and nothing further is needed.   

## 2019-10-07 DIAGNOSIS — G4733 Obstructive sleep apnea (adult) (pediatric): Secondary | ICD-10-CM | POA: Diagnosis not present

## 2019-10-12 DIAGNOSIS — G4733 Obstructive sleep apnea (adult) (pediatric): Secondary | ICD-10-CM | POA: Diagnosis not present

## 2019-10-22 ENCOUNTER — Other Ambulatory Visit (HOSPITAL_COMMUNITY)
Admission: RE | Admit: 2019-10-22 | Discharge: 2019-10-22 | Disposition: A | Discharge status: Home / Self Care | Payer: BC Managed Care – PPO | Source: Ambulatory Visit | Attending: Pulmonary Disease | Admitting: Pulmonary Disease

## 2019-10-22 DIAGNOSIS — Z20828 Contact with and (suspected) exposure to other viral communicable diseases: Secondary | ICD-10-CM | POA: Insufficient documentation

## 2019-10-22 DIAGNOSIS — Z01812 Encounter for preprocedural laboratory examination: Secondary | ICD-10-CM | POA: Insufficient documentation

## 2019-10-23 DIAGNOSIS — G4733 Obstructive sleep apnea (adult) (pediatric): Secondary | ICD-10-CM | POA: Diagnosis not present

## 2019-10-23 LAB — NOVEL CORONAVIRUS, NAA (HOSP ORDER, SEND-OUT TO REF LAB; TAT 18-24 HRS): SARS-CoV-2, NAA: NOT DETECTED

## 2019-10-25 ENCOUNTER — Encounter: Payer: Self-pay | Admitting: Pulmonary Disease

## 2019-10-25 ENCOUNTER — Other Ambulatory Visit: Payer: Self-pay

## 2019-10-25 ENCOUNTER — Ambulatory Visit: Payer: BC Managed Care – PPO | Admitting: Pulmonary Disease

## 2019-10-25 ENCOUNTER — Ambulatory Visit (INDEPENDENT_AMBULATORY_CARE_PROVIDER_SITE_OTHER): Payer: BC Managed Care – PPO | Admitting: Pulmonary Disease

## 2019-10-25 VITALS — BP 126/70 | HR 89 | Temp 97.0°F | Ht 71.0 in | Wt 272.0 lb

## 2019-10-25 DIAGNOSIS — Z87891 Personal history of nicotine dependence: Secondary | ICD-10-CM | POA: Diagnosis not present

## 2019-10-25 DIAGNOSIS — Z6835 Body mass index (BMI) 35.0-35.9, adult: Secondary | ICD-10-CM | POA: Diagnosis not present

## 2019-10-25 DIAGNOSIS — J4520 Mild intermittent asthma, uncomplicated: Secondary | ICD-10-CM

## 2019-10-25 DIAGNOSIS — Z7689 Persons encountering health services in other specified circumstances: Secondary | ICD-10-CM

## 2019-10-25 LAB — PULMONARY FUNCTION TEST
DL/VA % pred: 110 %
DL/VA: 4.7 ml/min/mmHg/L
DLCO unc % pred: 101 %
DLCO unc: 29.38 ml/min/mmHg
FEF 25-75 Post: 2.25 L/sec
FEF 25-75 Pre: 2.31 L/sec
FEF2575-%Change-Post: -2 %
FEF2575-%Pred-Post: 71 %
FEF2575-%Pred-Pre: 73 %
FEV1-%Change-Post: 0 %
FEV1-%Pred-Post: 78 %
FEV1-%Pred-Pre: 78 %
FEV1-Post: 2.99 L
FEV1-Pre: 3 L
FEV1FVC-%Change-Post: 2 %
FEV1FVC-%Pred-Pre: 98 %
FEV6-%Change-Post: -2 %
FEV6-%Pred-Post: 81 %
FEV6-%Pred-Pre: 83 %
FEV6-Post: 3.9 L
FEV6-Pre: 4.01 L
FEV6FVC-%Change-Post: 0 %
FEV6FVC-%Pred-Post: 104 %
FEV6FVC-%Pred-Pre: 104 %
FVC-%Change-Post: -2 %
FVC-%Pred-Post: 78 %
FVC-%Pred-Pre: 80 %
FVC-Post: 3.91 L
FVC-Pre: 4.01 L
Post FEV1/FVC ratio: 77 %
Post FEV6/FVC ratio: 100 %
Pre FEV1/FVC ratio: 75 %
Pre FEV6/FVC Ratio: 100 %
RV % pred: 159 %
RV: 3.61 L
TLC % pred: 102 %
TLC: 7.39 L

## 2019-10-25 MED ORDER — ALBUTEROL SULFATE HFA 108 (90 BASE) MCG/ACT IN AERS: 2.0000 | INHALATION_SPRAY | Freq: Four times a day (QID) | RESPIRATORY_TRACT | 5 refills | Status: DC | PRN

## 2019-10-25 NOTE — Patient Instructions (Signed)
Proair (albuterol) two puffs every 6 hours as needed for cough, chest congestion, wheezing, or feeling like you can't take a deep breath  Follow up in 6 months

## 2019-10-25 NOTE — Progress Notes (Signed)
Full PFT performed today. °

## 2019-10-25 NOTE — Progress Notes (Signed)
[name]  Chief Complaint  Patient presents with  . Follow-up    Constitutional:  BP 126/70 (BP Location: Right Arm, Cuff Size: Large)   Pulse 89   Temp (!) 97 F (36.1 C) (Temporal)   Ht 5\' 11"  (1.803 m)   Wt 272 lb (123.4 kg)   SpO2 96% Comment: RA  BMI 37.94 kg/m   Past Medical History:  PNA, HTN, GERD, Prostate cancer, HLD, DM  Brief Summary:  [name] is a 58 y.o. male former smoker with intermittent cough and nocturnal hypoxia.  He had sleep study with Dr. Maxwell Caul.  Found to have severe sleep apnea and oxygen desaturation.  Started on CPAP two days ago.    He has changed his diet to work on getting weight down.  His PFT today showed increased residual volume suggestive of air trapping.  Otherwise PFT was normal, and specifically wasn't consistent with COPD.  He still gets intermittent episodes of dry cough and feeling like he can't take a deep breath.   Physical Exam:   Appearance - well kempt   ENMT - no sinus tenderness, no nasal discharge, no oral exudate  Neck - no masses, trachea midline, no thyromegaly, no elevation in JVP  Respiratory - normal appearance of chest wall, normal respiratory effort w/o accessory muscle use, no dullness on percussion, no wheezing or rales  CV - s1s2 regular rate and rhythm, no murmurs, no peripheral edema, radial pulses symmetric  GI - soft, non tender  Lymph - no adenopathy noted in neck and axillary areas  MSK - normal gait  Ext - no cyanosis, clubbing, or joint inflammation noted  Skin - no rashes, lesions, or ulcers  Neuro - normal strength, oriented x 3  Psych - normal mood and affect   Assessment/Plan:   Air trapping on PFT with intermittent cough and feeling like he can't take deep breath. - might be related to intermittent asthma - will give him trial of proair prn  Obstructive sleep apnea. - followed by Dr. Maxwell Caul with [facility]  Obesity. - encouraged  him to keep up with weight loss efforts   Patient Instructions  Proair (albuterol) two puffs every 6 hours as needed for cough, chest congestion, wheezing, or feeling like you can't take a deep breath  Follow up in 6 months    Chesley Mires, MD [place] Pager: [phone] 10/25/2019, 10:28 AM  Flow Sheet    Pulmonary tests:  PFT 10/25/19 >> FEV1 3.00 (78%), FEV1% 75, TLC 7.39 (102%), RV 3.61 (159%), DLCO 101%, no BD  Medications:   Allergies as of 10/25/2019   No Known Allergies     Medication List       Accurate as of October 25, 2019 10:28 AM. If you have any questions, ask your nurse or doctor.        acetaminophen 500 MG tablet Commonly known as: TYLENOL Take 500 mg by mouth every 6 (six) hours as needed for headache.   albuterol 108 (90 Base) MCG/ACT inhaler Commonly known as: ProAir HFA Inhale 2 puffs into the lungs every 6 (six) hours as needed for wheezing or shortness of breath. Started by: Chesley Mires, MD   amLODipine 5 MG tablet Commonly known as: NORVASC Take 5 mg by mouth daily.   losartan 50 MG tablet Commonly known as: COZAAR TAKE ONE TABLET BY MOUTH ONCE DAILY   metFORMIN 500 MG tablet Commonly known as: GLUCOPHAGE Take 1 tablet by mouth 2 (  two) times daily.   pantoprazole 40 MG tablet Commonly known as: PROTONIX Take 1 tablet by mouth daily.   rosuvastatin 10 MG tablet Commonly known as: CRESTOR Take 10 mg by mouth daily.   Tums 500 MG chewable tablet Generic drug: calcium carbonate Chew 1 tablet by mouth as needed for indigestion or heartburn.       Past Surgical History:  He  has a past surgical history that includes Septoplasty (1976); Parotidectomy (Left, 09/29/2014); and Mass biopsy (Left, 09/29/2014).  Family History:  His family history includes Heart disease in his father; Stroke in his mother.  Social History:  He  reports that he quit smoking about 22 months ago. His smoking use included  cigarettes. He has a 96.00 pack-year smoking history. He has never used smokeless tobacco. He reports current alcohol use. He reports that he does not use drugs.

## 2019-11-22 DIAGNOSIS — G4733 Obstructive sleep apnea (adult) (pediatric): Secondary | ICD-10-CM | POA: Diagnosis not present

## 2019-12-23 DIAGNOSIS — G4733 Obstructive sleep apnea (adult) (pediatric): Secondary | ICD-10-CM | POA: Diagnosis not present

## 2020-01-14 DIAGNOSIS — E785 Hyperlipidemia, unspecified: Secondary | ICD-10-CM | POA: Diagnosis not present

## 2020-01-14 DIAGNOSIS — G4733 Obstructive sleep apnea (adult) (pediatric): Secondary | ICD-10-CM | POA: Diagnosis not present

## 2020-01-14 DIAGNOSIS — E1169 Type 2 diabetes mellitus with other specified complication: Secondary | ICD-10-CM | POA: Diagnosis not present

## 2020-01-14 DIAGNOSIS — Z0000 Encounter for general adult medical examination without abnormal findings: Secondary | ICD-10-CM | POA: Diagnosis not present

## 2020-01-23 DIAGNOSIS — G4733 Obstructive sleep apnea (adult) (pediatric): Secondary | ICD-10-CM | POA: Diagnosis not present

## 2020-01-27 DIAGNOSIS — E1169 Type 2 diabetes mellitus with other specified complication: Secondary | ICD-10-CM | POA: Diagnosis not present

## 2020-01-27 DIAGNOSIS — Z0000 Encounter for general adult medical examination without abnormal findings: Secondary | ICD-10-CM | POA: Diagnosis not present

## 2020-01-27 DIAGNOSIS — Z13220 Encounter for screening for lipoid disorders: Secondary | ICD-10-CM | POA: Diagnosis not present

## 2020-01-27 DIAGNOSIS — Z23 Encounter for immunization: Secondary | ICD-10-CM | POA: Diagnosis not present

## 2020-01-27 DIAGNOSIS — E785 Hyperlipidemia, unspecified: Secondary | ICD-10-CM | POA: Diagnosis not present

## 2020-01-27 DIAGNOSIS — G4734 Idiopathic sleep related nonobstructive alveolar hypoventilation: Secondary | ICD-10-CM | POA: Diagnosis not present

## 2020-03-13 DIAGNOSIS — C61 Malignant neoplasm of prostate: Secondary | ICD-10-CM | POA: Diagnosis not present

## 2020-03-20 DIAGNOSIS — C61 Malignant neoplasm of prostate: Secondary | ICD-10-CM | POA: Diagnosis not present

## 2020-03-27 DIAGNOSIS — Z23 Encounter for immunization: Secondary | ICD-10-CM | POA: Diagnosis not present

## 2020-05-01 DIAGNOSIS — C61 Malignant neoplasm of prostate: Secondary | ICD-10-CM | POA: Diagnosis not present

## 2020-05-28 DIAGNOSIS — C61 Malignant neoplasm of prostate: Secondary | ICD-10-CM | POA: Diagnosis not present

## 2020-07-10 DIAGNOSIS — N5201 Erectile dysfunction due to arterial insufficiency: Secondary | ICD-10-CM | POA: Diagnosis not present

## 2020-07-10 DIAGNOSIS — C61 Malignant neoplasm of prostate: Secondary | ICD-10-CM | POA: Diagnosis not present

## 2020-07-31 DIAGNOSIS — R221 Localized swelling, mass and lump, neck: Secondary | ICD-10-CM | POA: Diagnosis not present

## 2020-07-31 DIAGNOSIS — E1169 Type 2 diabetes mellitus with other specified complication: Secondary | ICD-10-CM | POA: Diagnosis not present

## 2020-07-31 DIAGNOSIS — G4733 Obstructive sleep apnea (adult) (pediatric): Secondary | ICD-10-CM | POA: Diagnosis not present

## 2020-07-31 DIAGNOSIS — I10 Essential (primary) hypertension: Secondary | ICD-10-CM | POA: Diagnosis not present

## 2020-07-31 DIAGNOSIS — E785 Hyperlipidemia, unspecified: Secondary | ICD-10-CM | POA: Diagnosis not present

## 2020-08-21 DIAGNOSIS — I10 Essential (primary) hypertension: Secondary | ICD-10-CM | POA: Diagnosis not present

## 2020-09-10 ENCOUNTER — Other Ambulatory Visit: Payer: Self-pay | Admitting: Urology

## 2020-09-10 DIAGNOSIS — C61 Malignant neoplasm of prostate: Secondary | ICD-10-CM

## 2020-09-15 DIAGNOSIS — G4733 Obstructive sleep apnea (adult) (pediatric): Secondary | ICD-10-CM | POA: Diagnosis not present

## 2020-09-18 DIAGNOSIS — R221 Localized swelling, mass and lump, neck: Secondary | ICD-10-CM | POA: Diagnosis not present

## 2020-09-18 DIAGNOSIS — J343 Hypertrophy of nasal turbinates: Secondary | ICD-10-CM | POA: Diagnosis not present

## 2020-09-18 DIAGNOSIS — Z86018 Personal history of other benign neoplasm: Secondary | ICD-10-CM | POA: Diagnosis not present

## 2020-09-23 ENCOUNTER — Other Ambulatory Visit: Payer: Self-pay | Admitting: Otolaryngology

## 2020-09-23 DIAGNOSIS — R221 Localized swelling, mass and lump, neck: Secondary | ICD-10-CM

## 2020-10-09 ENCOUNTER — Ambulatory Visit
Admission: RE | Admit: 2020-10-09 | Discharge: 2020-10-09 | Disposition: A | Discharge status: Home / Self Care | Payer: BC Managed Care – PPO | Source: Ambulatory Visit | Attending: Urology | Admitting: Urology

## 2020-10-09 ENCOUNTER — Other Ambulatory Visit: Payer: Self-pay

## 2020-10-09 ENCOUNTER — Ambulatory Visit
Admission: RE | Admit: 2020-10-09 | Discharge: 2020-10-09 | Disposition: A | Discharge status: Home / Self Care | Payer: BC Managed Care – PPO | Source: Ambulatory Visit | Attending: Otolaryngology | Admitting: Otolaryngology

## 2020-10-09 DIAGNOSIS — C61 Malignant neoplasm of prostate: Secondary | ICD-10-CM | POA: Diagnosis not present

## 2020-10-09 DIAGNOSIS — Z8546 Personal history of malignant neoplasm of prostate: Secondary | ICD-10-CM | POA: Diagnosis not present

## 2020-10-09 DIAGNOSIS — R590 Localized enlarged lymph nodes: Secondary | ICD-10-CM | POA: Diagnosis not present

## 2020-10-09 DIAGNOSIS — L729 Follicular cyst of the skin and subcutaneous tissue, unspecified: Secondary | ICD-10-CM | POA: Diagnosis not present

## 2020-10-09 DIAGNOSIS — R221 Localized swelling, mass and lump, neck: Secondary | ICD-10-CM | POA: Diagnosis not present

## 2020-10-09 DIAGNOSIS — M47812 Spondylosis without myelopathy or radiculopathy, cervical region: Secondary | ICD-10-CM | POA: Diagnosis not present

## 2020-10-09 MED ORDER — IOPAMIDOL (ISOVUE-300) INJECTION 61%
75.0000 mL | Freq: Once | INTRAVENOUS | Status: AC | PRN
  Administered 2020-10-09: 75 mL via INTRAVENOUS

## 2020-10-09 MED ORDER — GADOBENATE DIMEGLUMINE 529 MG/ML IV SOLN
20.0000 mL | Freq: Once | INTRAVENOUS | Status: AC | PRN
  Administered 2020-10-09: 20 mL via INTRAVENOUS

## 2020-10-23 DIAGNOSIS — Z01818 Encounter for other preprocedural examination: Secondary | ICD-10-CM | POA: Diagnosis not present

## 2020-10-27 ENCOUNTER — Other Ambulatory Visit: Payer: Self-pay | Admitting: Otolaryngology

## 2020-10-27 ENCOUNTER — Other Ambulatory Visit (HOSPITAL_COMMUNITY)
Admission: RE | Admit: 2020-10-27 | Discharge: 2020-10-27 | Disposition: A | Discharge status: Home / Self Care | Payer: BC Managed Care – PPO | Source: Ambulatory Visit | Attending: Otolaryngology | Admitting: Otolaryngology

## 2020-10-27 DIAGNOSIS — R590 Localized enlarged lymph nodes: Secondary | ICD-10-CM | POA: Diagnosis not present

## 2020-10-27 DIAGNOSIS — R221 Localized swelling, mass and lump, neck: Secondary | ICD-10-CM | POA: Diagnosis not present

## 2020-10-27 DIAGNOSIS — C8191 Hodgkin lymphoma, unspecified, lymph nodes of head, face, and neck: Secondary | ICD-10-CM | POA: Diagnosis not present

## 2020-11-03 LAB — SURGICAL PATHOLOGY

## 2020-11-04 DIAGNOSIS — C8190 Hodgkin lymphoma, unspecified, unspecified site: Secondary | ICD-10-CM

## 2020-11-04 HISTORY — DX: Hodgkin lymphoma, unspecified, unspecified site: C81.90

## 2020-11-05 ENCOUNTER — Telehealth: Payer: Self-pay | Admitting: Hematology and Oncology

## 2020-11-05 NOTE — Telephone Encounter (Signed)
Received a new pt referral from Dr. Redmond Baseman for [facility]. Mr. [name] has been cld and scheduled to see Dr. Lorenso Courier on 12/10 at 1pm. Pt aware to arrive 30 minutes early.

## 2020-11-13 ENCOUNTER — Inpatient Hospital Stay: Payer: BC Managed Care – PPO

## 2020-11-13 ENCOUNTER — Other Ambulatory Visit: Payer: Self-pay | Admitting: Hematology and Oncology

## 2020-11-13 ENCOUNTER — Encounter: Payer: Self-pay | Admitting: Hematology and Oncology

## 2020-11-13 ENCOUNTER — Other Ambulatory Visit: Payer: Self-pay

## 2020-11-13 ENCOUNTER — Inpatient Hospital Stay: Payer: BC Managed Care – PPO | Attending: Hematology and Oncology | Admitting: Hematology and Oncology

## 2020-11-13 VITALS — BP 157/94 | HR 89 | Temp 98.2°F | Resp 20 | Ht 71.0 in | Wt 240.5 lb

## 2020-11-13 DIAGNOSIS — Z8546 Personal history of malignant neoplasm of prostate: Secondary | ICD-10-CM | POA: Insufficient documentation

## 2020-11-13 DIAGNOSIS — C8121 Mixed cellularity classical Hodgkin lymphoma, lymph nodes of head, face, and neck: Secondary | ICD-10-CM | POA: Diagnosis not present

## 2020-11-13 LAB — CMP (CANCER CENTER ONLY)
ALT: 25 U/L (ref 0–44)
AST: 19 U/L (ref 15–41)
Albumin: 4.2 g/dL (ref 3.5–5.0)
Alkaline Phosphatase: 65 U/L (ref 38–126)
Anion gap: 12 (ref 5–15)
BUN: 10 mg/dL (ref 6–20)
CO2: 24 mmol/L (ref 22–32)
Calcium: 9.6 mg/dL (ref 8.9–10.3)
Chloride: 104 mmol/L (ref 98–111)
Creatinine: 0.99 mg/dL (ref 0.61–1.24)
GFR, Estimated: >60 mL/min (ref >60)
Glucose, Bld: 102 mg/dL — ABNORMAL HIGH (ref 70–99)
Potassium: 3.8 mmol/L (ref 3.5–5.1)
Sodium: 140 mmol/L (ref 135–145)
Total Bilirubin: 0.3 mg/dL (ref 0.3–1.2)
Total Protein: 7.9 g/dL (ref 6.5–8.1)

## 2020-11-13 LAB — CBC WITH DIFFERENTIAL (CANCER CENTER ONLY)
Abs Immature Granulocytes: 0.02 10*3/uL (ref 0.00–0.07)
Basophils Absolute: 0.1 10*3/uL (ref 0.0–0.1)
Basophils Relative: 1 %
Eosinophils Absolute: 0.2 10*3/uL (ref 0.0–0.5)
Eosinophils Relative: 3 %
HCT: 44.5 % (ref 39.0–52.0)
Hemoglobin: 15.1 g/dL (ref 13.0–17.0)
Immature Granulocytes: 0 %
Lymphocytes Relative: 24 %
Lymphs Abs: 1.6 10*3/uL (ref 0.7–4.0)
MCH: 28.7 pg (ref 26.0–34.0)
MCHC: 33.9 g/dL (ref 30.0–36.0)
MCV: 84.4 fL (ref 80.0–100.0)
Monocytes Absolute: 0.7 10*3/uL (ref 0.1–1.0)
Monocytes Relative: 10 %
Neutro Abs: 4.2 10*3/uL (ref 1.7–7.7)
Neutrophils Relative %: 62 %
Platelet Count: 234 10*3/uL (ref 150–400)
RBC: 5.27 MIL/uL (ref 4.22–5.81)
RDW: 12.7 % (ref 11.5–15.5)
WBC Count: 6.7 10*3/uL (ref 4.0–10.5)
nRBC: 0 % (ref 0.0–0.2)

## 2020-11-13 LAB — LACTATE DEHYDROGENASE: LDH: 158 U/L (ref 98–192)

## 2020-11-13 LAB — SEDIMENTATION RATE: Sed Rate: 60 mm/hr — ABNORMAL HIGH (ref 0–16)

## 2020-11-13 NOTE — Progress Notes (Signed)
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  Leaves on Saturdays and is back by the following Wednesday/Thursday. Needs  appts on Fridays at this time

## 2020-11-13 NOTE — Progress Notes (Signed)
[place] [phone]   Fax:([phone]) [place] NOTE  Patient Care Team: London Pepper, MD as PCP - General (Family Medicine)  Hematological/Oncological History # Classical Hodgkin's Lymphoma, Mixed Cellularity. Staging in Process 1) 10/09/2020: CT soft tissue neck showed well-circumscribed homogeneous mass in the right mid neck. 2) 10/27/2020: resection of neck mass. Pathology showed classical Hodgkin's lymphoma, mixed cellularity subtype 3) 11/13/2020: establish care with Dr. Lorenso Courier   CHIEF COMPLAINTS/PURPOSE OF CONSULTATION:  "Hodgkin's Lymphoma"  HISTORY OF PRESENTING ILLNESS:  [name] 59 y.o. male with medical history significant for GERD, HTN, and prostate cancer (under observation by Urology) who presents for evaluation of newly diagnosed Hodgkin's lymphoma.   On review of the previous records [name] went a CT scan of the neck on 10/09/2020.  At that time his findings showed a well-circumscribed homogenous mass in the right neck.  On 10/27/2020 the patient underwent resection of his neck mass with pathology showing classical Hodgkin's lymphoma, mixed cellularity subtype.  Due to concern for this new diagnosis the patient was referred to hematology and oncology for further evaluation and management.  On exam today [name] that he first found this mass several months ago when he noticed a bump in his neck.  He notes that his tonsils occasionally swell and therefore he did not think much of this.  Approximately 2 months ago when the mass got larger he became concerned and sought medical attention with ENT.  Patient was seen by Dr. Redmond Baseman and subsequently had a CT scan performed above.  The mass was resected and found to be a classical Hodgkin's lymphoma.  On further discussion [name] notes that he has not been having any issues with fevers, chills, sweats, nausea, vomiting or diarrhea.  He does that his weight has been stable but he  does occasionally get dizzy which he attributes to his blood pressure medications.  He notes that he is not having itching of the skin, rash, or other lymphadenopathy that has been palpable.  He denies any prior history of enlarged lymph nodes but does remind [place] that he has history of prostate cancer.  Family history is remarkable for a mother that died with a stroke and a father who had prostate cancer, sleep apnea, and heart disease.  The patient has 3 brothers 1 of which has heart disease.  He notes that he smoked for approximately 32 years but quit 3 years ago.  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.  He denies any alcohol use or drug use.  He notes that he sleeps well and has no other questions concerns or complaints.  A full 10 point ROS is listed below.  MEDICAL HISTORY:  Past Medical History:  Diagnosis Date  . Cancer ([place]) 10/15   recent dx prostate ca-no tx yet  . GERD (gastroesophageal reflux disease)   . Hypertension   . Pneumonia    history  . Teeth decayed     SURGICAL HISTORY: Past Surgical History:  Procedure Laterality Date  . MASS BIOPSY Left 09/29/2014   Procedure: LEFT LOWER NECK MASS EXCISION;  Surgeon: Izora Gala, MD;  Location: [facility];  Service: ENT;  Laterality: Left;  . PAROTIDECTOMY Left 09/29/2014   Procedure: LEFT PAROTIDECTOMY;  Surgeon: Izora Gala, MD;  Location: [facility];  Service: ENT;  Laterality: Left;  . SEPTOPLASTY  1976    SOCIAL HISTORY: Social  History   Socioeconomic History  . Marital status: Widowed    Spouse name: Not on file  . Number of children: Not on file  . Years of education: Not on file  . Highest education level: Not on file  Occupational History  . Not on file  Tobacco Use  . Smoking status: Former Smoker    Packs/day: 3.00    Years: 32.00    Pack years: 96.00    Types: Cigarettes    Quit date: 11/26/2017    Years since quitting: 2.9   . Smokeless tobacco: Never Used  Substance and Sexual Activity  . Alcohol use: Yes    Comment: twice a year  . Drug use: No  . Sexual activity: Not on file  Other Topics Concern  . Not on file  Social History Narrative  . Not on file   Social Determinants of Health   Financial Resource Strain: Not on file  Food Insecurity: Not on file  Transportation Needs: Not on file  Physical Activity: Not on file  Stress: Not on file  Social Connections: Not on file  Intimate Partner Violence: Not on file    FAMILY HISTORY: Family History  Problem Relation Age of Onset  . Stroke Mother   . Heart disease Father     ALLERGIES:  has No Known Allergies.  MEDICATIONS:  Current Outpatient Medications  Medication Sig Dispense Refill  . acetaminophen (TYLENOL) 500 MG tablet Take 500 mg by mouth every 6 (six) hours as needed for headache.    Marland Kitchen amLODipine (NORVASC) 5 MG tablet Take 5 mg by mouth daily.    Marland Kitchen losartan (COZAAR) 100 MG tablet Take 100 mg by mouth daily.    . metFORMIN (GLUCOPHAGE) 500 MG tablet Take 1 tablet by mouth 2 (two) times daily.    . pantoprazole (PROTONIX) 40 MG tablet Take 1 tablet by mouth daily.    . rosuvastatin (CRESTOR) 5 MG tablet Take 5 mg by mouth daily.     No current facility-administered medications for this visit.    REVIEW OF SYSTEMS:   Constitutional: ( - ) fevers, ( - )  chills , ( - ) night sweats Eyes: ( - ) blurriness of vision, ( - ) double vision, ( - ) watery eyes Ears, nose, mouth, throat, and face: ( - ) mucositis, ( - ) sore throat Respiratory: ( - ) cough, ( - ) dyspnea, ( - ) wheezes Cardiovascular: ( - ) palpitation, ( - ) chest discomfort, ( - ) lower extremity swelling Gastrointestinal:  ( - ) nausea, ( - ) heartburn, ( - ) change in bowel habits Skin: ( - ) abnormal skin rashes Lymphatics: ( - ) new lymphadenopathy, ( - ) easy bruising Neurological: ( - ) numbness, ( - ) tingling, ( - ) new weaknesses Behavioral/Psych: ( - ) mood  change, ( - ) new changes  All other systems were reviewed with the patient and are negative.  PHYSICAL EXAMINATION: ECOG PERFORMANCE STATUS: 1 - Symptomatic but completely ambulatory  Vitals:   11/13/20 1257  BP: (!) 157/94  Pulse: 89  Resp: 20  Temp: 98.2 F (36.8 C)  SpO2: 100%   Filed Weights   11/13/20 1257  Weight: 240 lb 8 oz (109.1 kg)    GENERAL: well appearing middle aged Caucasian male in NAD  SKIN: skin color, texture, turgor are normal, no rashes or significant lesions EYES: conjunctiva are pink and non-injected, sclera clear NECK: supple, non-tender LYMPH:  Prior  surgical scars on right neck, some palpable tissue (unclear if residual node or scar tissue) no palpable lymphadenopathy in the cervical, axillary or supraclavicular lymph nodes.  LUNGS: clear to auscultation and percussion with normal breathing effort HEART: regular rate & rhythm and no murmurs and no lower extremity edema Musculoskeletal: no cyanosis of digits and no clubbing  PSYCH: alert & oriented x 3, fluent speech NEURO: no focal motor/sensory deficits  LABORATORY DATA:  I have reviewed the data as listed CBC Latest Ref Rng & Units 11/13/2020 09/29/2014 09/07/2008  WBC 4.0 - 10.5 K/uL 6.7 - 9.1  Hemoglobin 13.0 - 17.0 g/dL 15.1 17.5(H) 13.5  Hematocrit 39.0 - 52.0 % 44.5 - 40.1  Platelets 150 - 400 K/uL 234 - 210    CMP Latest Ref Rng & Units 11/13/2020 09/23/2014 07/29/2014  Glucose 70 - 99 mg/dL 102(H) 139(H) 110(H)  BUN 6 - 20 mg/dL '10 11 12  ' Creatinine 0.61 - 1.24 mg/dL 0.99 1.04 1.2  Sodium 135 - 145 mmol/L 140 138 137  Potassium 3.5 - 5.1 mmol/L 3.8 4.3 3.5  Chloride 98 - 111 mmol/L 104 97 100  CO2 22 - 32 mmol/L '24 26 29  ' Calcium 8.9 - 10.3 mg/dL 9.6 10.2 9.8  Total Protein 6.5 - 8.1 g/dL 7.9 - -  Total Bilirubin 0.3 - 1.2 mg/dL 0.3 - -  Alkaline Phos 38 - 126 U/L 65 - -  AST 15 - 41 U/L 19 - -  ALT 0 - 44 U/L 25 - -     PATHOLOGY:    RADIOGRAPHIC STUDIES:  CLINICAL  DATA:  Enlarging right neck mass.  History of prostate cancer. History of Warthin's tumor left parotid resected 2015.  EXAM: CT NECK WITH CONTRAST  TECHNIQUE: Multidetector CT imaging of the neck was performed using the standard protocol following the bolus administration of intravenous contrast.  CONTRAST:  27m ISOVUE-300 IOPAMIDOL (ISOVUE-300) INJECTION 61%  COMPARISON:  CT neck 08/13/2014  FINDINGS: Pharynx and larynx: Normal. No mass or swelling.  Salivary glands: Left parotidectomy. No recurrent tumor in the left parotid bed. Right parotid normal. Submandibular glands normal bilaterally.  Thyroid: Negative  Lymph nodes: Well-circumscribed mass in the right mid neck posterior to the sternocleidomastoid muscle and lateral to the jugular vein. This mass is homogeneous in density and measures 3.5 x 2.8 x 3.7 cm. Surrounding fat appears normal. No calcification. This was not present previously.  Right level 2 lymph node 8 mm, smaller compared to the prior study. Right posterior lymph node 8 mm similar to the prior CT.  Left level 2 lymph node 8 mm, smaller compared to the prior study. Left posterior lymph node 9 mm similar to the prior study.  Vascular: Normal vascular enhancement. Mild atherosclerotic calcification of the carotid bifurcation bilaterally.  Limited intracranial: Negative  Visualized orbits: Not imaged  Mastoids and visualized paranasal sinuses: Visualized paranasal sinuses clear. Mastoid clear bilaterally.  Skeleton: Cervical spondylosis.  No acute skeletal abnormality.  Upper chest: Lung apices clear bilaterally.  Other: Previously noted dermal cyst left supraclavicular region has been resected.  IMPRESSION: Well-circumscribed homogeneous mass in the right mid neck. This may represent enlarged lymph node and biopsy recommended. Differential diagnosis includes a complex cyst. No other pathologically enlarged lymph nodes in  the neck.  Left parotidectomy without recurrent Warthin's tumor.   Electronically Signed   By: CFranchot GalloM.D.   On: 10/09/2020 20:43  No results found.  ASSESSMENT & PLAN DMaki Sweetser89y.o. male with medical history  significant for GERD, HTN, and prostate cancer (under observation by Urology) who presents for evaluation of newly diagnosed Hodgkin's lymphoma.  After review the labs, view the records, schedule the patient the findings are consistent with a newly diagnosed classical Hodgkin's lymphoma mixed cellularity subtype.  At this time we will need to proceed with staging studies to include blood work CBC, CMP, LDH, haptoglobin, and a PET CT scan in order to determine the extent of disease.  Additionally in order to prepare the patient for treatment we will need an echocardiogram, PFTs, and port placement.  I discussed the need for all of these today with the patient.  Once the extent of the disease is known and the regimen can be selected.  At this time I will plan to proceed with ABVD chemotherapy assuming the patient's lung function is appropriate.  In the event that there is concern given the patient's extensive smoking history and age we could proceed with AVD alone.  # Classical Hodgkin's Lymphoma, Mixed Cellularity. Staging in Process --We will obtain baseline labs of CBC, CMP, LDH, and ESR. --Complete the staging with a PET CT scan. --Prepare the patient for possible ABVD chemotherapy with echocardiogram, port placement, and PFTs. --Once the regimen has been determined we will proceed with chemotherapy education. --Supportive medications will be prescribed once we have had the opportunity to discuss the regimen --Return to clinic in approximate 3 to 4 weeks time once all of the above work-up and tests have been completed.  Orders Placed This Encounter  Procedures  . CBC with Differential (Cancer Center Only)    Standing Status:   Future    Number of Occurrences:   1     Standing Expiration Date:   11/13/2021  . CMP ([facility] only)    Standing Status:   Future    Number of Occurrences:   1    Standing Expiration Date:   11/13/2021  . Lactate dehydrogenase (LDH)    Standing Status:   Future    Number of Occurrences:   1    Standing Expiration Date:   11/13/2021  . Sedimentation rate    Standing Status:   Future    Number of Occurrences:   1    Standing Expiration Date:   11/13/2021  . Beta 2 microglobulin    Standing Status:   Future    Number of Occurrences:   1    Standing Expiration Date:   11/13/2021    All questions were answered. The patient knows to call the clinic with any problems, questions or concerns.  A total of more than 60 minutes were spent on this encounter and over half of that time was spent on counseling and coordination of care as outlined above.   Ledell Peoples, MD Department of Hematology/Oncology [place] at [facility] Phone: [phone] Pager: [phone] Email: [email or web address]  11/13/2020 3:45 PM

## 2020-11-16 LAB — BETA 2 MICROGLOBULIN, SERUM: Beta-2 Microglobulin: 1.8 mg/L (ref 0.6–2.4)

## 2020-11-19 ENCOUNTER — Other Ambulatory Visit: Payer: Self-pay | Admitting: Hematology and Oncology

## 2020-11-19 DIAGNOSIS — C8121 Mixed cellularity classical Hodgkin lymphoma, lymph nodes of head, face, and neck: Secondary | ICD-10-CM

## 2020-11-20 DIAGNOSIS — C61 Malignant neoplasm of prostate: Secondary | ICD-10-CM | POA: Diagnosis not present

## 2020-11-24 ENCOUNTER — Other Ambulatory Visit: Payer: Self-pay | Admitting: Radiology

## 2020-11-26 ENCOUNTER — Ambulatory Visit (HOSPITAL_COMMUNITY): Payer: BC Managed Care – PPO

## 2020-11-26 ENCOUNTER — Other Ambulatory Visit (HOSPITAL_COMMUNITY): Payer: BC Managed Care – PPO

## 2020-11-26 ENCOUNTER — Other Ambulatory Visit: Payer: Self-pay

## 2020-11-26 ENCOUNTER — Ambulatory Visit (HOSPITAL_COMMUNITY)
Admission: RE | Admit: 2020-11-26 | Discharge: 2020-11-26 | Disposition: A | Discharge status: Home / Self Care | Payer: BC Managed Care – PPO | Source: Ambulatory Visit | Attending: Hematology and Oncology | Admitting: Hematology and Oncology

## 2020-11-26 DIAGNOSIS — D7389 Other diseases of spleen: Secondary | ICD-10-CM | POA: Diagnosis not present

## 2020-11-26 DIAGNOSIS — C8121 Mixed cellularity classical Hodgkin lymphoma, lymph nodes of head, face, and neck: Secondary | ICD-10-CM | POA: Diagnosis not present

## 2020-11-26 DIAGNOSIS — I700 Atherosclerosis of aorta: Secondary | ICD-10-CM | POA: Diagnosis not present

## 2020-11-26 DIAGNOSIS — C8591 Non-Hodgkin lymphoma, unspecified, lymph nodes of head, face, and neck: Secondary | ICD-10-CM | POA: Diagnosis not present

## 2020-11-26 LAB — GLUCOSE, CAPILLARY: Glucose-Capillary: 105 mg/dL — ABNORMAL HIGH (ref 70–99)

## 2020-11-26 MED ORDER — FLUDEOXYGLUCOSE F - 18 (FDG) INJECTION
11.9900 | Freq: Once | INTRAVENOUS | Status: AC | PRN
  Administered 2020-11-26: 11.99 via INTRAVENOUS

## 2020-11-30 ENCOUNTER — Ambulatory Visit (HOSPITAL_COMMUNITY)
Admission: RE | Admit: 2020-11-30 | Discharge: 2020-11-30 | Disposition: A | Discharge status: Home / Self Care | Payer: BC Managed Care – PPO | Source: Ambulatory Visit | Attending: Hematology and Oncology | Admitting: Hematology and Oncology

## 2020-11-30 ENCOUNTER — Other Ambulatory Visit: Payer: Self-pay

## 2020-11-30 DIAGNOSIS — I10 Essential (primary) hypertension: Secondary | ICD-10-CM | POA: Insufficient documentation

## 2020-11-30 DIAGNOSIS — Z0189 Encounter for other specified special examinations: Secondary | ICD-10-CM

## 2020-11-30 DIAGNOSIS — Z01810 Encounter for preprocedural cardiovascular examination: Secondary | ICD-10-CM | POA: Insufficient documentation

## 2020-11-30 DIAGNOSIS — C8121 Mixed cellularity classical Hodgkin lymphoma, lymph nodes of head, face, and neck: Secondary | ICD-10-CM

## 2020-11-30 LAB — ECHOCARDIOGRAM COMPLETE
Area-P 1/2: 2.66 cm2
S' Lateral: 3.9 cm

## 2020-11-30 NOTE — Progress Notes (Signed)
  Echocardiogram 2D Echocardiogram has been performed.  [name] 11/30/2020, 10:54 AM

## 2020-12-01 ENCOUNTER — Telehealth: Payer: Self-pay | Admitting: *Deleted

## 2020-12-01 NOTE — Telephone Encounter (Signed)
Patient called to ask about scheduling the lung test he needed prior to starting chemo - said he had had an ECHO yesterday.   Contacted [facility] [phone] and left voice mail requesting Pulmonary Function Test ordered for patient on 11/13/20 be scheduled. Message included patient info and MD desk nurse CB #. Contacted patient to update.

## 2020-12-08 ENCOUNTER — Other Ambulatory Visit (HOSPITAL_COMMUNITY): Payer: BC Managed Care – PPO

## 2020-12-09 ENCOUNTER — Other Ambulatory Visit (HOSPITAL_COMMUNITY)
Admission: RE | Admit: 2020-12-09 | Discharge: 2020-12-09 | Disposition: A | Discharge status: Home / Self Care | Payer: BC Managed Care – PPO | Source: Ambulatory Visit | Attending: Hematology and Oncology | Admitting: Hematology and Oncology

## 2020-12-09 ENCOUNTER — Other Ambulatory Visit: Payer: Self-pay | Admitting: Radiology

## 2020-12-09 DIAGNOSIS — Z20822 Contact with and (suspected) exposure to covid-19: Secondary | ICD-10-CM | POA: Insufficient documentation

## 2020-12-09 DIAGNOSIS — Z01812 Encounter for preprocedural laboratory examination: Secondary | ICD-10-CM | POA: Diagnosis not present

## 2020-12-09 LAB — SARS CORONAVIRUS 2 (TAT 6-24 HRS): SARS Coronavirus 2: NEGATIVE

## 2020-12-10 ENCOUNTER — Ambulatory Visit (HOSPITAL_COMMUNITY)
Admission: RE | Admit: 2020-12-10 | Discharge: 2020-12-10 | Disposition: A | Discharge status: Home / Self Care | Payer: BC Managed Care – PPO | Source: Ambulatory Visit | Attending: Hematology and Oncology | Admitting: Hematology and Oncology

## 2020-12-10 ENCOUNTER — Other Ambulatory Visit: Payer: Self-pay

## 2020-12-10 ENCOUNTER — Other Ambulatory Visit: Payer: Self-pay | Admitting: Radiology

## 2020-12-10 DIAGNOSIS — C8121 Mixed cellularity classical Hodgkin lymphoma, lymph nodes of head, face, and neck: Secondary | ICD-10-CM

## 2020-12-10 LAB — PULMONARY FUNCTION TEST
DL/VA % pred: 96 %
DL/VA: 4.09 ml/min/mmHg/L
DLCO unc % pred: 96 %
DLCO unc: 26.56 ml/min/mmHg
FEF 25-75 Post: 2.25 L/sec
FEF 25-75 Pre: 2.07 L/sec
FEF2575-%Change-Post: 8 %
FEF2575-%Pred-Post: 75 %
FEF2575-%Pred-Pre: 69 %
FEV1-%Change-Post: 0 %
FEV1-%Pred-Post: 81 %
FEV1-%Pred-Pre: 81 %
FEV1-Post: 2.94 L
FEV1-Pre: 2.95 L
FEV1FVC-%Change-Post: 1 %
FEV1FVC-%Pred-Pre: 96 %
FEV6-%Change-Post: 0 %
FEV6-%Pred-Post: 87 %
FEV6-%Pred-Pre: 87 %
FEV6-Post: 3.97 L
FEV6-Pre: 3.96 L
FEV6FVC-%Change-Post: 1 %
FEV6FVC-%Pred-Post: 104 %
FEV6FVC-%Pred-Pre: 102 %
FVC-%Change-Post: -1 %
FVC-%Pred-Post: 83 %
FVC-%Pred-Pre: 84 %
FVC-Post: 3.99 L
FVC-Pre: 4.04 L
Post FEV1/FVC ratio: 74 %
Post FEV6/FVC ratio: 100 %
Pre FEV1/FVC ratio: 73 %
Pre FEV6/FVC Ratio: 98 %
RV % pred: 111 %
RV: 2.49 L
TLC % pred: 98 %
TLC: 6.86 L

## 2020-12-10 MED ORDER — ALBUTEROL SULFATE (2.5 MG/3ML) 0.083% IN NEBU
2.5000 mg | INHALATION_SOLUTION | Freq: Once | RESPIRATORY_TRACT | Status: AC
Start: 2020-12-10 — End: 2020-12-10
  Administered 2020-12-10: 2.5 mg via RESPIRATORY_TRACT

## 2020-12-11 ENCOUNTER — Inpatient Hospital Stay: Payer: BC Managed Care – PPO | Attending: Hematology and Oncology | Admitting: Hematology and Oncology

## 2020-12-11 ENCOUNTER — Other Ambulatory Visit: Payer: BC Managed Care – PPO

## 2020-12-11 ENCOUNTER — Encounter (HOSPITAL_COMMUNITY): Payer: Self-pay

## 2020-12-11 ENCOUNTER — Ambulatory Visit (HOSPITAL_COMMUNITY)
Admission: RE | Admit: 2020-12-11 | Discharge: 2020-12-11 | Disposition: A | Discharge status: Home / Self Care | Payer: BC Managed Care – PPO | Source: Ambulatory Visit | Attending: Hematology and Oncology | Admitting: Hematology and Oncology

## 2020-12-11 ENCOUNTER — Encounter: Payer: Self-pay | Admitting: Hematology and Oncology

## 2020-12-11 ENCOUNTER — Other Ambulatory Visit: Payer: Self-pay

## 2020-12-11 VITALS — BP 152/85 | HR 81 | Temp 98.1°F | Resp 20 | Ht 70.0 in | Wt 266.1 lb

## 2020-12-11 DIAGNOSIS — Z8546 Personal history of malignant neoplasm of prostate: Secondary | ICD-10-CM | POA: Insufficient documentation

## 2020-12-11 DIAGNOSIS — Z5111 Encounter for antineoplastic chemotherapy: Secondary | ICD-10-CM | POA: Insufficient documentation

## 2020-12-11 DIAGNOSIS — Z7984 Long term (current) use of oral hypoglycemic drugs: Secondary | ICD-10-CM | POA: Diagnosis not present

## 2020-12-11 DIAGNOSIS — Z7189 Other specified counseling: Secondary | ICD-10-CM

## 2020-12-11 DIAGNOSIS — Z79899 Other long term (current) drug therapy: Secondary | ICD-10-CM | POA: Insufficient documentation

## 2020-12-11 DIAGNOSIS — I10 Essential (primary) hypertension: Secondary | ICD-10-CM | POA: Diagnosis not present

## 2020-12-11 DIAGNOSIS — K219 Gastro-esophageal reflux disease without esophagitis: Secondary | ICD-10-CM | POA: Diagnosis not present

## 2020-12-11 DIAGNOSIS — C61 Malignant neoplasm of prostate: Secondary | ICD-10-CM | POA: Insufficient documentation

## 2020-12-11 DIAGNOSIS — C8121 Mixed cellularity classical Hodgkin lymphoma, lymph nodes of head, face, and neck: Secondary | ICD-10-CM | POA: Insufficient documentation

## 2020-12-11 DIAGNOSIS — C8190 Hodgkin lymphoma, unspecified, unspecified site: Secondary | ICD-10-CM | POA: Insufficient documentation

## 2020-12-11 DIAGNOSIS — Z452 Encounter for adjustment and management of vascular access device: Secondary | ICD-10-CM | POA: Diagnosis not present

## 2020-12-11 DIAGNOSIS — Z87891 Personal history of nicotine dependence: Secondary | ICD-10-CM | POA: Insufficient documentation

## 2020-12-11 HISTORY — PX: IR IMAGING GUIDED PORT INSERTION: IMG5740

## 2020-12-11 LAB — COMPREHENSIVE METABOLIC PANEL
ALT: 28 U/L (ref 0–44)
AST: 22 U/L (ref 15–41)
Albumin: 4.5 g/dL (ref 3.5–5.0)
Alkaline Phosphatase: 52 U/L (ref 38–126)
Anion gap: 10 (ref 5–15)
BUN: 11 mg/dL (ref 6–20)
CO2: 26 mmol/L (ref 22–32)
Calcium: 9.4 mg/dL (ref 8.9–10.3)
Chloride: 101 mmol/L (ref 98–111)
Creatinine, Ser: 1.01 mg/dL (ref 0.61–1.24)
GFR, Estimated: >60 mL/min (ref >60)
Glucose, Bld: 106 mg/dL — ABNORMAL HIGH (ref 70–99)
Potassium: 3.9 mmol/L (ref 3.5–5.1)
Sodium: 137 mmol/L (ref 135–145)
Total Bilirubin: 0.7 mg/dL (ref 0.3–1.2)
Total Protein: 7.8 g/dL (ref 6.5–8.1)

## 2020-12-11 LAB — CBC WITH DIFFERENTIAL/PLATELET
Abs Immature Granulocytes: 0.02 10*3/uL (ref 0.00–0.07)
Basophils Absolute: 0.1 10*3/uL (ref 0.0–0.1)
Basophils Relative: 1 %
Eosinophils Absolute: 0.2 10*3/uL (ref 0.0–0.5)
Eosinophils Relative: 3 %
HCT: 45.1 % (ref 39.0–52.0)
Hemoglobin: 15 g/dL (ref 13.0–17.0)
Immature Granulocytes: 0 %
Lymphocytes Relative: 29 %
Lymphs Abs: 1.7 10*3/uL (ref 0.7–4.0)
MCH: 29 pg (ref 26.0–34.0)
MCHC: 33.3 g/dL (ref 30.0–36.0)
MCV: 87.1 fL (ref 80.0–100.0)
Monocytes Absolute: 0.7 10*3/uL (ref 0.1–1.0)
Monocytes Relative: 12 %
Neutro Abs: 3.3 10*3/uL (ref 1.7–7.7)
Neutrophils Relative %: 55 %
Platelets: 204 10*3/uL (ref 150–400)
RBC: 5.18 MIL/uL (ref 4.22–5.81)
RDW: 13.2 % (ref 11.5–15.5)
WBC: 6 10*3/uL (ref 4.0–10.5)
nRBC: 0 % (ref 0.0–0.2)

## 2020-12-11 LAB — GLUCOSE, CAPILLARY: Glucose-Capillary: 97 mg/dL (ref 70–99)

## 2020-12-11 LAB — PROTIME-INR
INR: 1.1 (ref 0.8–1.2)
Prothrombin Time: 13.3 seconds (ref 11.4–15.2)

## 2020-12-11 LAB — LACTATE DEHYDROGENASE: LDH: 112 U/L (ref 98–192)

## 2020-12-11 MED ORDER — FENTANYL CITRATE (PF) 100 MCG/2ML IJ SOLN
INTRAMUSCULAR | Status: AC
  Filled 2020-12-11: qty 2

## 2020-12-11 MED ORDER — LIDOCAINE-EPINEPHRINE 1 %-1:100000 IJ SOLN
INTRAMUSCULAR | Status: AC | PRN
  Administered 2020-12-11: 10 mL via INTRADERMAL

## 2020-12-11 MED ORDER — CEFAZOLIN SODIUM-DEXTROSE 2-4 GM/100ML-% IV SOLN: 2.0000 g | INTRAVENOUS | Status: AC

## 2020-12-11 MED ORDER — CEFAZOLIN SODIUM-DEXTROSE 2-4 GM/100ML-% IV SOLN
INTRAVENOUS | Status: AC
  Administered 2020-12-11: 2 g via INTRAVENOUS
  Filled 2020-12-11: qty 100

## 2020-12-11 MED ORDER — LIDOCAINE HCL 1 % IJ SOLN
INTRAMUSCULAR | Status: AC
  Filled 2020-12-11: qty 20

## 2020-12-11 MED ORDER — LIDOCAINE-EPINEPHRINE 1 %-1:100000 IJ SOLN
INTRAMUSCULAR | Status: AC
  Filled 2020-12-11: qty 1

## 2020-12-11 MED ORDER — MIDAZOLAM HCL 2 MG/2ML IJ SOLN
INTRAMUSCULAR | Status: AC | PRN
  Administered 2020-12-11 (×4): 1 mg via INTRAVENOUS

## 2020-12-11 MED ORDER — MIDAZOLAM HCL 2 MG/2ML IJ SOLN
INTRAMUSCULAR | Status: AC
  Filled 2020-12-11: qty 4

## 2020-12-11 MED ORDER — FENTANYL CITRATE (PF) 100 MCG/2ML IJ SOLN
INTRAMUSCULAR | Status: AC | PRN
  Administered 2020-12-11 (×4): 50 ug via INTRAVENOUS

## 2020-12-11 MED ORDER — HEPARIN SOD (PORK) LOCK FLUSH 100 UNIT/ML IV SOLN
INTRAVENOUS | Status: AC
  Filled 2020-12-11: qty 5

## 2020-12-11 MED ORDER — LIDOCAINE HCL (PF) 1 % IJ SOLN
INTRAMUSCULAR | Status: AC | PRN
  Administered 2020-12-11: 10 mL via INTRADERMAL

## 2020-12-11 MED ORDER — HEPARIN SOD (PORK) LOCK FLUSH 100 UNIT/ML IV SOLN
INTRAVENOUS | Status: AC | PRN
  Administered 2020-12-11 (×2): 500 [IU] via INTRAVENOUS

## 2020-12-11 MED ORDER — SODIUM CHLORIDE 0.9 % IV SOLN: INTRAVENOUS | Status: DC

## 2020-12-11 NOTE — Progress Notes (Signed)
START ON PATHWAY REGIMEN - Lymphoma and CLL     A cycle is every 28 days:     Bleomycin      Dacarbazine      Doxorubicin      Vinblastine   **Always confirm dose/schedule in your pharmacy ordering system**  Patient Characteristics: Classical Hodgkin Lymphoma, First Line, Stage I / II, Early Unfavorable with  Risk Factors Other  Than Bulky Mediastinal Disease Disease Type: Not Applicable Disease Type: Not Applicable Disease Type: Classical Hodgkin Lymphoma Line of therapy: First Line First Line, Stage I/II Disease Characteristics: Early Unfavorable with Risk Factors Other Than Bulky Mediastinal Disease Intent of Therapy: Curative Intent, Discussed with Patient

## 2020-12-11 NOTE — Discharge Instructions (Signed)

## 2020-12-11 NOTE — Progress Notes (Signed)
[place] [phone]   [phone]  PROGRESS NOTE  Patient Care Team: London Pepper, MD as PCP - General (Family Medicine)  Hematological/Oncological History # Classical Hodgkin's Lymphoma, Mixed Cellularity. 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 2) 10/27/2020: resection of neck mass. Pathology showed classical Hodgkin's lymphoma, mixed cellularity subtype 3) 11/13/2020: establish care with Dr. Lorenso Courier  4) 11/26/2020: PET CT scan shows single hypermetabolic unenlarged right level II cervical lymph node identified on today's study. FDG uptake is compatible with Deauville 4. Other small bilateral cervical and upper normal to borderline hepatoduodenal ligament lymph nodes show Deauville 2 uptake levels  Interval History:  [name] 60 y.o. male with medical history significant for Classical Hodgkin's lymphoma Early Stage/Unfavorable risk who presents for a follow up visit. The patient's last visit was on 11/13/2020 at which time he established care. In the interim since the last visit he has completed port placement, PFTs, and TTE.  On exam today [name] that he is feeling well.  He has had no major changes in his health since our last visit on 11/13/2020.  His port is settling in well though he starting to feel some soreness as the medication is wearing off and was just placed this morning.  The bulk of our discussion today focused on the treatment plan moving forward and the steps in order to get his chemotherapy underway.  Details of this discussion are listed below.  MEDICAL HISTORY:  Past Medical History:  Diagnosis Date  . Cancer ([place]) 10/15   recent dx prostate ca-no tx yet  . GERD (gastroesophageal reflux disease)   . Hypertension   . Pneumonia    history  . Teeth decayed     SURGICAL HISTORY: Past Surgical History:  Procedure Laterality Date   . IR IMAGING GUIDED PORT INSERTION  12/11/2020  . MASS BIOPSY Left 09/29/2014   Procedure: LEFT LOWER NECK MASS EXCISION;  Surgeon: Izora Gala, MD;  Location: [place];  Service: ENT;  Laterality: Left;  . PAROTIDECTOMY Left 09/29/2014   Procedure: LEFT PAROTIDECTOMY;  Surgeon: Izora Gala, MD;  Location: [place];  Service: ENT;  Laterality: Left;  . SEPTOPLASTY  1976    SOCIAL HISTORY: Social History   Socioeconomic History  . Marital status: Widowed    Spouse name: Not on file  . Number of children: Not on file  . Years of education: Not on file  . Highest education level: Not on file  Occupational History  . Not on file  Tobacco Use  . Smoking status: Former Smoker    Packs/day: 3.00    Years: 32.00    Pack years: 96.00    Types: Cigarettes    Quit date: 11/26/2017    Years since quitting: 3.0  . Smokeless tobacco: Never Used  Substance and Sexual Activity  . Alcohol use: Yes    Comment: twice a year  . Drug use: No  . Sexual activity: Not on file  Other Topics Concern  . Not on file  Social History Narrative  . Not on file   Social Determinants of Health   Financial Resource Strain: Not on file  Food Insecurity: Not on file  Transportation Needs: Not on file  Physical Activity: Not on file  Stress: Not on file  Social Connections: Not on file  Intimate Partner Violence: Not on file    FAMILY HISTORY: Family  History  Problem Relation Age of Onset  . Stroke Mother   . Heart disease Father     ALLERGIES:  has No Known Allergies.  MEDICATIONS:  Current Outpatient Medications  Medication Sig Dispense Refill  . lidocaine-prilocaine (EMLA) cream Apply 1 application topically as needed. 30 g 0  . ondansetron (ZOFRAN) 8 MG tablet Take 1 tablet (8 mg total) by mouth every 8 (eight) hours as needed for nausea or vomiting. 39 tablet 0  . prochlorperazine (COMPAZINE) 10 MG tablet Take 1 tablet (10 mg total) by mouth every 6  (six) hours as needed for nausea or vomiting. 30 tablet 0  . acetaminophen (TYLENOL) 500 MG tablet Take 500 mg by mouth every 6 (six) hours as needed for headache.    Marland Kitchen amLODipine (NORVASC) 5 MG tablet Take 5 mg by mouth daily.    Marland Kitchen losartan (COZAAR) 100 MG tablet Take 100 mg by mouth daily.    . metFORMIN (GLUCOPHAGE) 500 MG tablet Take 1 tablet by mouth 2 (two) times daily.    . pantoprazole (PROTONIX) 40 MG tablet Take 1 tablet by mouth daily.    . rosuvastatin (CRESTOR) 5 MG tablet Take 5 mg by mouth daily.     No current facility-administered medications for this visit.    REVIEW OF SYSTEMS:   Constitutional: ( - ) fevers, ( - )  chills , ( - ) night sweats Eyes: ( - ) blurriness of vision, ( - ) double vision, ( - ) watery eyes Ears, nose, mouth, throat, and face: ( - ) mucositis, ( - ) sore throat Respiratory: ( - ) cough, ( - ) dyspnea, ( - ) wheezes Cardiovascular: ( - ) palpitation, ( - ) chest discomfort, ( - ) lower extremity swelling Gastrointestinal:  ( - ) nausea, ( - ) heartburn, ( - ) change in bowel habits Skin: ( - ) abnormal skin rashes Lymphatics: ( - ) new lymphadenopathy, ( - ) easy bruising Neurological: ( - ) numbness, ( - ) tingling, ( - ) new weaknesses Behavioral/Psych: ( - ) mood change, ( - ) new changes  All other systems were reviewed with the patient and are negative.  PHYSICAL EXAMINATION: ECOG PERFORMANCE STATUS: 1 - Symptomatic but completely ambulatory  Vitals:   12/11/20 1535  BP: (!) 152/85  Pulse: 81  Resp: 20  Temp: 98.1 F (36.7 C)  SpO2: 98%   Filed Weights   12/11/20 1535  Weight: 266 lb 1.6 oz (120.7 kg)    GENERAL: well appearing middle aged Caucasian male in NAD  SKIN: skin color, texture, turgor are normal, no rashes or significant lesions EYES: conjunctiva are pink and non-injected, sclera clear NECK: supple, non-tender LYMPH:  Prior surgical scars on right neck, some palpable tissue (unclear if residual node or scar  tissue) no palpable lymphadenopathy in the cervical, axillary or supraclavicular lymph nodes.  LUNGS: clear to auscultation and percussion with normal breathing effort HEART: regular rate & rhythm and no murmurs and no lower extremity edema Musculoskeletal: no cyanosis of digits and no clubbing  PSYCH: alert & oriented x 3, fluent speech NEURO: no focal motor/sensory deficits  LABORATORY DATA:  I have reviewed the data as listed CBC Latest Ref Rng & Units 12/11/2020 11/13/2020 09/29/2014  WBC 4.0 - 10.5 K/uL 6.0 6.7 -  Hemoglobin 13.0 - 17.0 g/dL 15.0 15.1 17.5(H)  Hematocrit 39.0 - 52.0 % 45.1 44.5 -  Platelets 150 - 400 K/uL 204 234 -    CMP  Latest Ref Rng & Units 12/11/2020 11/13/2020 09/23/2014  Glucose 70 - 99 mg/dL 106(H) 102(H) 139(H)  BUN 6 - 20 mg/dL '11 10 11  ' Creatinine 0.61 - 1.24 mg/dL 1.01 0.99 1.04  Sodium 135 - 145 mmol/L 137 140 138  Potassium 3.5 - 5.1 mmol/L 3.9 3.8 4.3  Chloride 98 - 111 mmol/L 101 104 97  CO2 22 - 32 mmol/L '26 24 26  ' Calcium 8.9 - 10.3 mg/dL 9.4 9.6 10.2  Total Protein 6.5 - 8.1 g/dL 7.8 7.9 -  Total Bilirubin 0.3 - 1.2 mg/dL 0.7 0.3 -  Alkaline Phos 38 - 126 U/L 52 65 -  AST 15 - 41 U/L 22 19 -  ALT 0 - 44 U/L 28 25 -    No results found for: MPROTEIN No results found for: KPAFRELGTCHN, LAMBDASER, KAPLAMBRATIO   RADIOGRAPHIC STUDIES: I have personally reviewed the radiological images as listed and agreed with the findings in the report: findings consistent with single large metabolically active lymph node in the neck.   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 EXAM: NUCLEAR MEDICINE PET SKULL BASE TO THIGH TECHNIQUE: 12.0 mCi F-18 FDG was injected intravenously. Full-ring PET imaging was performed from the skull base to thigh after the radiotracer. CT data was obtained and used for attenuation correction and anatomic localization.  Fasting blood glucose: 105. Mg/dl COMPARISON:  Neck CT 10/09/2020. FINDINGS: Mediastinal blood pool activity: SUV max 2.2 Liver activity: SUV max 3.6 NECK: Right-sided level II lymph node measuring 6 mm short axis just deep to the inferior parotid gland on 16/1 is hypermetabolic with SUV max = 4.6. Another lymph node on that same image adjacent to the vasculature measures 7 mm short axis but shows no hypermetabolic FDG accumulation above background soft tissue levels. Left-sided 6 mm level 2 node on 34/4 shows low level FDG uptake with SUV max = 1.1. Other left-sided level II nodes show similar low level FDG uptake. Incidental CT findings: None. CHEST: No hypermetabolic mediastinal or hilar nodes. No suspicious pulmonary nodules on the CT scan. Incidental CT findings: Atherosclerotic calcification is noted in the wall of the thoracic aorta. 15 mm ground-glass opacity in the left upper lobe on 29/8 shows no hypermetabolism. ABDOMEN/PELVIS: No abnormal hypermetabolic activity within the liver, pancreas, adrenal glands, or spleen. 12 mm short axis hepatoduodenal ligament lymph node is upper normal to borderline increased in size with SUV max = 2.5 Incidental CT findings: Insert fatty liver calcified granulomata noted in the spleen. There is abdominal aortic atherosclerosis without aneurysm. SKELETON: No focal hypermetabolic activity to suggest skeletal metastasis. Incidental CT findings: Small sclerotic focus right glenoid shows no hypermetabolism. IMPRESSION: 1. Single hypermetabolic unenlarged right level II cervical lymph node identified on today's study. FDG uptake is compatible with Deauville 4. 2. Other small bilateral cervical and upper normal to borderline hepatoduodenal ligament lymph nodes show Deauville 2 uptake levels. No unexpected or suspicious hypermetabolic disease in the chest, abdomen, or pelvis. 3. 15 mm ground-glass opacity in the left upper lobe without hypermetabolism. Well differentiated  adenocarcinoma can be poorly FDG avid and surveillance recommended. Consider follow-up CT chest without contrast in 3 months. 4.  Aortic Atherosclerois (ICD10-170.0) Electronically Signed   By: Misty Stanley M.D.   On: 11/26/2020 10:25   ECHOCARDIOGRAM COMPLETE  Result Date: 11/30/2020    ECHOCARDIOGRAM REPORT   Patient Name:   [name] Date  of Exam: 11/30/2020 Medical Rec #:  [id number]    Height:       71.0 in Accession #:    [id number]   Weight:       240.5 lb Date of Birth:  [birth year]    BSA:          2.280 m Patient Age:    68 years     BP:           168/91 mmHg Patient Gender: M            HR:           73 bpm. Exam Location:  Outpatient Procedure: 2D Echo, Cardiac Doppler and Color Doppler Indications:    Chemo Z09  History:        Patient has no prior history of Echocardiogram examinations.                 Risk Factors:Hypertension.  Sonographer:    Bernadene Person RDCS Referring Phys: [id number] [place]  1. Left ventricular ejection fraction, by estimation, is 60 to 65%. The left ventricle has normal function. The left ventricle has no regional wall motion abnormalities. Left ventricular diastolic parameters are consistent with Grade I diastolic dysfunction (impaired relaxation). The average left ventricular global longitudinal strain is -27.4 %. The global longitudinal strain is normal.  2. Right ventricular systolic function is normal. The right ventricular size is normal.  3. The mitral valve is normal in structure. No evidence of mitral valve regurgitation. No evidence of mitral stenosis.  4. The aortic valve is normal in structure. Aortic valve regurgitation is not visualized. No aortic stenosis is present.  5. There is borderline dilatation of the ascending aorta, measuring 38 mm.  6. The inferior vena cava is normal in size with greater than 50% respiratory variability, suggesting right atrial pressure of 3 mmHg. FINDINGS  Left Ventricle: Left ventricular ejection fraction,  by estimation, is 60 to 65%. The left ventricle has normal function. The left ventricle has no regional wall motion abnormalities. The average left ventricular global longitudinal strain is -27.4 %. The global longitudinal strain is normal. The left ventricular internal cavity size was normal in size. There is no left ventricular hypertrophy. Left ventricular diastolic parameters are consistent with Grade I diastolic dysfunction (impaired relaxation). Right Ventricle: The right ventricular size is normal. No increase in right ventricular wall thickness. Right ventricular systolic function is normal. Left Atrium: Left atrial size was normal in size. Right Atrium: Right atrial size was normal in size. Pericardium: There is no evidence of pericardial effusion. Mitral Valve: The mitral valve is normal in structure. No evidence of mitral valve regurgitation. No evidence of mitral valve stenosis. Tricuspid Valve: The tricuspid valve is normal in structure. Tricuspid valve regurgitation is not demonstrated. No evidence of tricuspid stenosis. Aortic Valve: The aortic valve is normal in structure. Aortic valve regurgitation is not visualized. No aortic stenosis is present. Pulmonic Valve: The pulmonic valve was normal in structure. Pulmonic valve regurgitation is not visualized. No evidence of pulmonic stenosis. Aorta: The aortic root is normal in size and structure. There is borderline dilatation of the ascending aorta, measuring 38 mm. Venous: The inferior vena cava is normal in size with greater than 50% respiratory variability, suggesting right atrial pressure of 3 mmHg. IAS/Shunts: No atrial level shunt detected by color flow Doppler.  LEFT VENTRICLE PLAX 2D LVIDd:         5.50 cm  Diastology LVIDs:  3.90 cm  LV e' medial:    6.96 cm/s LV PW:         1.10 cm  LV E/e' medial:  14.3 LV IVS:        1.10 cm  LV e' lateral:   8.92 cm/s LVOT diam:     2.20 cm  LV E/e' lateral: 11.1 LV SV:         91 LV SV Index:   40        2D Longitudinal Strain LVOT Area:     3.80 cm 2D Strain GLS Avg:     -27.4 %  RIGHT VENTRICLE RV S prime:     12.90 cm/s TAPSE (M-mode): 2.2 cm LEFT ATRIUM             Index       RIGHT ATRIUM           Index LA diam:        4.90 cm 2.15 cm/m  RA Area:     17.70 cm LA Vol (A2C):   51.6 ml 22.63 ml/m RA Volume:   48.80 ml  21.40 ml/m LA Vol (A4C):   67.4 ml 29.56 ml/m LA Biplane Vol: 59.6 ml 26.14 ml/m  AORTIC VALVE LVOT Vmax:   115.00 cm/s LVOT Vmean:  80.300 cm/s LVOT VTI:    0.239 m  AORTA Ao Root diam: 3.90 cm Ao Asc diam:  3.80 cm MITRAL VALVE MV Area (PHT): 2.66 cm     SHUNTS MV Decel Time: 285 msec     Systemic VTI:  0.24 m MV E velocity: 99.40 cm/s   Systemic Diam: 2.20 cm MV A velocity: 104.00 cm/s MV E/A ratio:  0.96 Candee Furbish MD Electronically signed by Candee Furbish MD Signature Date/Time: 11/30/2020/3:27:12 PM    Final    IR IMAGING GUIDED PORT INSERTION  Result Date: 12/11/2020 INDICATION: 60 year old with Hodgkin's lymphoma. Port-A-Cath needed for chemotherapy. EXAM: FLUOROSCOPIC AND ULTRASOUND GUIDED PLACEMENT OF A SUBCUTANEOUS PORT COMPARISON:  None. MEDICATIONS: Ancef 2 g; The antibiotic was administered within an appropriate time interval prior to skin puncture. ANESTHESIA/SEDATION: Versed 4.0 mg IV; Fentanyl 200 mcg IV; Moderate Sedation Time:  31 minutes The patient was continuously monitored during the procedure by the interventional radiology nurse under my direct supervision. FLUOROSCOPY TIME:  12 seconds, 8 mGy COMPLICATIONS: None immediate. PROCEDURE: The procedure, risks, benefits, and alternatives were explained to the patient. Questions regarding the procedure were encouraged and answered. The patient understands and consents to the procedure. Patient was placed supine on the interventional table. Ultrasound confirmed a patent right internal jugular vein. Ultrasound image was saved for documentation. The right chest and neck were cleaned with a skin antiseptic and a sterile  drape was placed. Maximal barrier sterile technique was utilized including caps, mask, sterile gowns, sterile gloves, sterile drape, hand hygiene and skin antiseptic. The right neck was anesthetized with 1% lidocaine. Small incision was made in the right neck with a blade. Micropuncture set was placed in the right internal jugular vein with ultrasound guidance. The micropuncture wire was used for measurement purposes. The right chest was anesthetized with 1% lidocaine with epinephrine. #15 blade was used to make an incision and a subcutaneous port pocket was formed. [place] was assembled. Subcutaneous tunnel was formed with a stiff tunneling device. The port catheter was brought through the subcutaneous tunnel. The port was placed in the subcutaneous pocket. The micropuncture set was exchanged for a peel-away sheath. The catheter was  placed through the peel-away sheath and the tip was positioned at the superior cavoatrial junction. Catheter placement was confirmed with fluoroscopy. The port was accessed and flushed with heparinized saline. The port pocket was closed using two layers of absorbable sutures and Dermabond. The vein skin site was closed using a single layer of absorbable suture and Dermabond. Sterile dressings were applied. Patient tolerated the procedure well without an immediate complication. Ultrasound and fluoroscopic images were taken and saved for this procedure. IMPRESSION: Placement of a subcutaneous port device. Catheter tip at the superior cavoatrial junction. Electronically Signed   By: Markus Daft M.D.   On: 12/11/2020 12:59    ASSESSMENT & PLAN [name] 60 y.o. male with medical history significant for Classical Hodgkin's lymphoma Early Stage/Unfavorable risk who presents for a follow up visit.  After review the labs, the records, review of the PFTs, and review the echocardiogram the findings are most consistent with an early stage/unfavorable risk classical Hodgkin's  lymphoma.  Given that he meets unfavorable criteria via multiple guidelines I would recommend that we proceed with treatment as recommended in the NCCN guidelines for patients with early stage unfavorable disease (which is the exact same treatment recommended for stage III-IV disease).  Today we discussed the AVD chemotherapy regimen.  We discussed the expected side effects including possible hair loss, nausea, vomiting, diarrhea, constipation, cytopenias, neutropenia, cardiac dysfunction, and neurological damage.  We discussed that the patient is an early stage unfavorable risk disease meaning he will require 2 cycles of chemotherapy followed by interval PET CT scan to determine the rest of the course moving forward.  The patient voices understanding of the risks and benefits of this chemotherapy and was willing to proceed with treatment.  The treatment of choice for this patient will be a AVD chemotherapy for 2 cycles followed by a PET CT scan.  If response is noted on this PET CT scan we will proceed with a further 4 cycles of AVD  therapy.  The patient will be treated with every 2 week rounds of chemotherapy using this regimen. AVD chemotherapy consists of doxorubicin 25 mg/m on days 1 and 15, vinblastine 6 mg per metered squared IV on days 1 and 15, and dacarbazine 375 mg/m IV on days 1 and 15.  # Classical Hodgkin's Lymphoma, Mixed Cellularity. 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 There are some smaller lymph nodes in the neck and one near the hepatoduodenal ligament, though would favor diagnosis of early stage --based on these criteria the recommendation would be for AVD x 2 cycles with interval PET CT scan followed by AVD x 4 cycles (assuming good response on  interval PET).  --will avoid bleomycin in this patient due to smoking history, mild abnormalities on PFTs and age --plan to start treatment in approximately 1 weeks time --RTC for Cycle 1 Day 15 of treatment   #Supportive Care --chemotherapy education to be scheduled  --zofran 69m q8H PRN and compazine 171mPO q6H for nausea -- EMLA cream for port -- no pain medication required at this time.   No orders of the defined types were placed in this encounter.  All questions were answered. The patient knows to call the clinic with any problems, questions or concerns.  A total of more than 30  minutes were spent on this encounter and over half of that time was spent on counseling and coordination of care as outlined above.   Ledell Peoples, MD Department of Hematology/Oncology [place] [facility] Phone: [phone] Pager: [phone] Email: [name]  12/14/2020 5:23 PM

## 2020-12-11 NOTE — H&P (Signed)
Referring Physician(s): Dorsey,John T IV  Supervising Physician: Markus Daft  Patient Status:  [facility] OP  Chief Complaint: "I'm here for a port a cath"  Subjective: Patient familiar to IR service from left parotid lesion biopsy in 2015.  He has a history of prostate cancer, under observation, and newly diagnosed Hodgkin's lymphoma.  He presents today for Port-A-Cath placement for chemotherapy.  He currently denies fever, headache, chest pain, dyspnea, cough, abdominal/back pain, nausea, vomiting or bleeding.  Additional medical history as below.  Past Medical History:  Diagnosis Date  . Cancer ([place]) 10/15   recent dx prostate ca-no tx yet  . GERD (gastroesophageal reflux disease)   . Hypertension   . Pneumonia    history  . Teeth decayed    Past Surgical History:  Procedure Laterality Date  . MASS BIOPSY Left 09/29/2014   Procedure: LEFT LOWER NECK MASS EXCISION;  Surgeon: Izora Gala, MD;  Location: [place];  Service: ENT;  Laterality: Left;  . PAROTIDECTOMY Left 09/29/2014   Procedure: LEFT PAROTIDECTOMY;  Surgeon: Izora Gala, MD;  Location: [place];  Service: ENT;  Laterality: Left;  . SEPTOPLASTY  1976      Allergies: Patient has no known allergies.  Medications: Prior to Admission medications   Medication Sig Start Date End Date Taking? Authorizing Provider  acetaminophen (TYLENOL) 500 MG tablet Take 500 mg by mouth every 6 (six) hours as needed for headache.   Yes [provider]  amLODipine (NORVASC) 5 MG tablet Take 5 mg by mouth daily.   Yes [provider]  losartan (COZAAR) 100 MG tablet Take 100 mg by mouth daily. 11/01/20  Yes [provider]  metFORMIN (GLUCOPHAGE) 500 MG tablet Take 1 tablet by mouth 2 (two) times daily. 08/22/19  Yes [provider]  pantoprazole (PROTONIX) 40 MG tablet Take 1 tablet by mouth daily. 08/14/19  Yes [provider]  rosuvastatin (CRESTOR) 5 MG  tablet Take 5 mg by mouth daily. 10/28/20  Yes [provider]     Vital Signs: BP (!) 165/99   Pulse 76   Temp 98.3 F (36.8 C) (Oral)   Resp 18   Ht 5\' 10"  (1.778 m)   Wt 250 lb (113.4 kg)   SpO2 100%   BMI 35.87 kg/m   Physical Exam awake, alert.  Chest with slightly diminished breath sounds right base, left clear.  Heart with regular rate and rhythm.  Abdomen soft, positive bowel sounds, nontender.  No lower extremity edema.  Scar right lateral neck from recent tumor excision.  Site appears clean and dry.  Imaging: No results found.  Labs:  CBC: Recent Labs    11/13/20 1413 12/11/20 1003  WBC 6.7 6.0  HGB 15.1 15.0  HCT 44.5 45.1  PLT 234 204    COAGS: No results for input(s): INR, APTT in the last 8760 hours.  BMP: Recent Labs    11/13/20 1413  NA 140  K 3.8  CL 104  CO2 24  GLUCOSE 102*  BUN 10  CALCIUM 9.6  CREATININE 0.99  GFRNONAA >60    LIVER FUNCTION TESTS: Recent Labs    11/13/20 1413  BILITOT 0.3  AST 19  ALT 25  ALKPHOS 65  PROT 7.9  ALBUMIN 4.2    Assessment and Plan: Patient familiar to IR service from left parotid lesion biopsy in 2015.  He has a history of prostate cancer, under observation, and newly diagnosed Hodgkin's lymphoma.  He  presents today for Port-A-Cath placement for chemotherapy. Risks and benefits of image guided port-a-catheter placement was discussed with the patient including, but not limited to bleeding, infection, pneumothorax, or fibrin sheath development and need for additional procedures.  All of the patient's questions were answered, patient is agreeable to proceed. Consent signed and in chart.     Electronically Signed: D. Rowe Robert, PA-C 12/11/2020, 10:24 AM   I spent a total of 25 minutes at the the patient's bedside AND on the patient's hospital floor or unit, greater than 50% of which was counseling/coordinating care for Port-A-Cath placement

## 2020-12-11 NOTE — Procedures (Signed)
Interventional Radiology Procedure:   Indications: Hodgkin lymphoma  Procedure: Port placement  Findings: Right jugular port, tip at SVC/RA junction  Complications: None     EBL: Minimal, less than 10 ml  Plan: Discharge in one hour.  Keep port site and incisions dry for at least 24 hours.     Nadya Hopwood R. Keely Drennan, MD  Pager: [phone]   

## 2020-12-14 ENCOUNTER — Encounter: Payer: Self-pay | Admitting: Hematology and Oncology

## 2020-12-14 ENCOUNTER — Inpatient Hospital Stay: Payer: BC Managed Care – PPO

## 2020-12-14 ENCOUNTER — Other Ambulatory Visit: Payer: Self-pay

## 2020-12-14 ENCOUNTER — Telehealth: Payer: Self-pay | Admitting: *Deleted

## 2020-12-14 MED ORDER — PROCHLORPERAZINE MALEATE 10 MG PO TABS: 10.0000 mg | ORAL_TABLET | Freq: Four times a day (QID) | ORAL | 0 refills | Status: DC | PRN

## 2020-12-14 MED ORDER — ONDANSETRON HCL 8 MG PO TABS: 8.0000 mg | ORAL_TABLET | Freq: Three times a day (TID) | ORAL | 0 refills | Status: DC | PRN

## 2020-12-14 MED ORDER — LIDOCAINE-PRILOCAINE 2.5-2.5 % EX CREA
1.0000 | TOPICAL_CREAM | CUTANEOUS | 0 refills | Status: DC | PRN
Start: 2020-12-14 — End: 2021-07-05

## 2020-12-14 NOTE — Telephone Encounter (Signed)
Received call from patient asking about his chemo education appt. He would like to schedule it today or tomorrow. Discussed thio with Patient Education nurse, Jesse Fall. She can see him today @ 2pm. Pt notified of this and is agreeable. Scheduling message sent

## 2020-12-15 DIAGNOSIS — G4733 Obstructive sleep apnea (adult) (pediatric): Secondary | ICD-10-CM | POA: Diagnosis not present

## 2020-12-16 ENCOUNTER — Telehealth: Payer: Self-pay | Admitting: Hematology and Oncology

## 2020-12-16 NOTE — Telephone Encounter (Signed)
Scheduled per 1/7 los. Called and spoke with pt, confirmed 1/14 appts

## 2020-12-17 NOTE — Progress Notes (Signed)
Pharmacist Chemotherapy Monitoring - Initial Assessment    Anticipated start date: 12/18/20   Regimen:  . Are orders appropriate based on the patient's diagnosis, regimen, and cycle? Yes . Does the plan date match the patient's scheduled date? Yes . Is the sequencing of drugs appropriate? Yes . Are the premedications appropriate for the patient's regimen? Yes . Prior Authorization for treatment is: Approved o If applicable, is the correct biosimilar selected based on the patient's insurance? not applicable  Organ Function and Labs: Marland Kitchen Are dose adjustments needed based on the patient's renal function, hepatic function, or hematologic function? No . Are appropriate labs ordered prior to the start of patient's treatment? Yes . Other organ system assessment, if indicated: anthracyclines: Echo/ MUGA . The following baseline labs, if indicated, have been ordered: N/A  Dose Assessment: . Are the drug doses appropriate? Yes . Are the following correct: o Drug concentrations Yes o IV fluid compatible with drug Yes o Administration routes Yes o Timing of therapy Yes . If applicable, does the patient have documented access for treatment and/or plans for port-a-cath placement? yes . If applicable, have lifetime cumulative doses been properly documented and assessed? yes Lifetime Dose Tracking  No doses have been documented on this patient for the following tracked chemicals: Doxorubicin, Epirubicin, Idarubicin, Daunorubicin, Mitoxantrone, Bleomycin, Oxaliplatin, Carboplatin, Liposomal Doxorubicin  o   Toxicity Monitoring/Prevention: . The patient has the following take home antiemetics prescribed: Ondansetron and Prochlorperazine . The patient has the following take home medications prescribed: N/A . Medication allergies and previous infusion related reactions, if applicable, have been reviewed and addressed. Yes . The patient's current medication list has been assessed for drug-drug  interactions with their chemotherapy regimen. no significant drug-drug interactions were identified on review.  Order Review: . Are the treatment plan orders signed? No . Is the patient scheduled to see a provider prior to their treatment? No  I verify that I have reviewed each item in the above checklist and answered each question accordingly.   Kennith Center, Pharm.D., CPP 12/17/2020@12 :38 PM

## 2020-12-18 ENCOUNTER — Other Ambulatory Visit: Payer: Self-pay | Admitting: Hematology and Oncology

## 2020-12-18 ENCOUNTER — Inpatient Hospital Stay: Payer: BC Managed Care – PPO

## 2020-12-18 ENCOUNTER — Other Ambulatory Visit: Payer: Self-pay

## 2020-12-18 VITALS — BP 131/78 | HR 81 | Temp 98.3°F | Resp 20

## 2020-12-18 DIAGNOSIS — C8121 Mixed cellularity classical Hodgkin lymphoma, lymph nodes of head, face, and neck: Secondary | ICD-10-CM

## 2020-12-18 DIAGNOSIS — C61 Malignant neoplasm of prostate: Secondary | ICD-10-CM | POA: Diagnosis not present

## 2020-12-18 DIAGNOSIS — K219 Gastro-esophageal reflux disease without esophagitis: Secondary | ICD-10-CM | POA: Diagnosis not present

## 2020-12-18 DIAGNOSIS — Z5111 Encounter for antineoplastic chemotherapy: Secondary | ICD-10-CM | POA: Diagnosis not present

## 2020-12-18 DIAGNOSIS — I10 Essential (primary) hypertension: Secondary | ICD-10-CM | POA: Diagnosis not present

## 2020-12-18 DIAGNOSIS — Z87891 Personal history of nicotine dependence: Secondary | ICD-10-CM | POA: Diagnosis not present

## 2020-12-18 DIAGNOSIS — Z79899 Other long term (current) drug therapy: Secondary | ICD-10-CM | POA: Diagnosis not present

## 2020-12-18 DIAGNOSIS — Z95828 Presence of other vascular implants and grafts: Secondary | ICD-10-CM

## 2020-12-18 LAB — CMP (CANCER CENTER ONLY)
ALT: 25 U/L (ref 0–44)
AST: 21 U/L (ref 15–41)
Albumin: 4.1 g/dL (ref 3.5–5.0)
Alkaline Phosphatase: 55 U/L (ref 38–126)
Anion gap: 8 (ref 5–15)
BUN: 12 mg/dL (ref 6–20)
CO2: 24 mmol/L (ref 22–32)
Calcium: 9.6 mg/dL (ref 8.9–10.3)
Chloride: 105 mmol/L (ref 98–111)
Creatinine: 1.06 mg/dL (ref 0.61–1.24)
GFR, Estimated: >60 mL/min (ref >60)
Glucose, Bld: 156 mg/dL — ABNORMAL HIGH (ref 70–99)
Potassium: 4.2 mmol/L (ref 3.5–5.1)
Sodium: 137 mmol/L (ref 135–145)
Total Bilirubin: 0.4 mg/dL (ref 0.3–1.2)
Total Protein: 7.5 g/dL (ref 6.5–8.1)

## 2020-12-18 LAB — LACTATE DEHYDROGENASE: LDH: 160 U/L (ref 98–192)

## 2020-12-18 LAB — CBC WITH DIFFERENTIAL (CANCER CENTER ONLY)
Abs Immature Granulocytes: 0.01 10*3/uL (ref 0.00–0.07)
Basophils Absolute: 0 10*3/uL (ref 0.0–0.1)
Basophils Relative: 1 %
Eosinophils Absolute: 0.2 10*3/uL (ref 0.0–0.5)
Eosinophils Relative: 3 %
HCT: 44.7 % (ref 39.0–52.0)
Hemoglobin: 15.2 g/dL (ref 13.0–17.0)
Immature Granulocytes: 0 %
Lymphocytes Relative: 22 %
Lymphs Abs: 1.4 10*3/uL (ref 0.7–4.0)
MCH: 29.2 pg (ref 26.0–34.0)
MCHC: 34 g/dL (ref 30.0–36.0)
MCV: 86 fL (ref 80.0–100.0)
Monocytes Absolute: 0.7 10*3/uL (ref 0.1–1.0)
Monocytes Relative: 11 %
Neutro Abs: 4 10*3/uL (ref 1.7–7.7)
Neutrophils Relative %: 63 %
Platelet Count: 207 10*3/uL (ref 150–400)
RBC: 5.2 MIL/uL (ref 4.22–5.81)
RDW: 13 % (ref 11.5–15.5)
WBC Count: 6.4 10*3/uL (ref 4.0–10.5)
nRBC: 0 % (ref 0.0–0.2)

## 2020-12-18 MED ORDER — PALONOSETRON HCL INJECTION 0.25 MG/5ML
0.2500 mg | Freq: Once | INTRAVENOUS | Status: AC
  Administered 2020-12-18: 0.25 mg via INTRAVENOUS

## 2020-12-18 MED ORDER — VINBLASTINE SULFATE CHEMO INJECTION 1 MG/ML
6.0000 mg/m2 | Freq: Once | INTRAVENOUS | Status: AC
  Administered 2020-12-18: 14.6 mg via INTRAVENOUS
  Filled 2020-12-18: qty 14.6

## 2020-12-18 MED ORDER — PALONOSETRON HCL INJECTION 0.25 MG/5ML
INTRAVENOUS | Status: AC
  Filled 2020-12-18: qty 5

## 2020-12-18 MED ORDER — SODIUM CHLORIDE 0.9% FLUSH
10.0000 mL | INTRAVENOUS | Status: DC | PRN
  Administered 2020-12-18: 10 mL via INTRAVENOUS
  Filled 2020-12-18: qty 10

## 2020-12-18 MED ORDER — SODIUM CHLORIDE 0.9% FLUSH
10.0000 mL | INTRAVENOUS | Status: DC | PRN
Start: 2020-12-18 — End: 2020-12-18
  Administered 2020-12-18: 10 mL
  Filled 2020-12-18: qty 10

## 2020-12-18 MED ORDER — SODIUM CHLORIDE 0.9 % IV SOLN
150.0000 mg | Freq: Once | INTRAVENOUS | Status: AC
  Administered 2020-12-18: 150 mg via INTRAVENOUS
  Filled 2020-12-18: qty 150

## 2020-12-18 MED ORDER — DOXORUBICIN HCL CHEMO IV INJECTION 2 MG/ML
25.0000 mg/m2 | Freq: Once | INTRAVENOUS | Status: AC
  Administered 2020-12-18: 62 mg via INTRAVENOUS
  Filled 2020-12-18: qty 31

## 2020-12-18 MED ORDER — SODIUM CHLORIDE 0.9 % IV SOLN
Freq: Once | INTRAVENOUS | Status: AC
  Filled 2020-12-18: qty 250

## 2020-12-18 MED ORDER — SODIUM CHLORIDE 0.9 % IV SOLN
10.0000 mg | Freq: Once | INTRAVENOUS | Status: AC
  Administered 2020-12-18: 10 mg via INTRAVENOUS
  Filled 2020-12-18: qty 10

## 2020-12-18 MED ORDER — HEPARIN SOD (PORK) LOCK FLUSH 100 UNIT/ML IV SOLN
500.0000 [IU] | Freq: Once | INTRAVENOUS | Status: AC | PRN
  Administered 2020-12-18: 500 [IU]
  Filled 2020-12-18: qty 5

## 2020-12-18 MED ORDER — SODIUM CHLORIDE 0.9 % IV SOLN
375.0000 mg/m2 | Freq: Once | INTRAVENOUS | Status: AC
  Administered 2020-12-18: 920 mg via INTRAVENOUS
  Filled 2020-12-18: qty 92

## 2020-12-18 NOTE — Patient Instructions (Signed)
[facility] Discharge Instructions for Patients Receiving Chemotherapy  Today you received the following chemotherapy agents Adriamycin, Velban, DTIC  To help prevent nausea and vomiting after your treatment, we encourage you to take your nausea medication as prescribed.   If you develop nausea and vomiting that is not controlled by your nausea medication, call the clinic.   BELOW ARE SYMPTOMS THAT SHOULD BE REPORTED IMMEDIATELY:  *FEVER GREATER THAN 100.5 F  *CHILLS WITH OR WITHOUT FEVER  NAUSEA AND VOMITING THAT IS NOT CONTROLLED WITH YOUR NAUSEA MEDICATION  *UNUSUAL SHORTNESS OF BREATH  *UNUSUAL BRUISING OR BLEEDING  TENDERNESS IN MOUTH AND THROAT WITH OR WITHOUT PRESENCE OF ULCERS  *URINARY PROBLEMS  *BOWEL PROBLEMS  UNUSUAL RASH Items with * indicate a potential emergency and should be followed up as soon as possible.  Feel free to call the clinic should you have any questions or concerns. The clinic phone number is [phone].  Please show the [facility] at check-in to the Emergency Department and triage nurse.  Doxorubicin injection What is this medicine? DOXORUBICIN (dox oh ROO bi sin) is a chemotherapy drug. It is used to treat many kinds of cancer like leukemia, lymphoma, neuroblastoma, sarcoma, and Wilms' tumor. It is also used to treat bladder cancer, breast cancer, lung cancer, ovarian cancer, stomach cancer, and thyroid cancer. This medicine may be used for other purposes; ask your health care provider or pharmacist if you have questions. COMMON BRAND NAME(S): Adriamycin, Adriamycin PFS, Adriamycin RDF, Rubex What should I tell my health care provider before I take this medicine? They need to know if you have any of these conditions:  heart disease  history of low blood counts caused by a medicine  liver disease  recent or ongoing radiation therapy  an unusual or allergic reaction to doxorubicin, other chemotherapy agents, other  medicines, foods, dyes, or preservatives  pregnant or trying to get pregnant  breast-feeding How should I use this medicine? This drug is given as an infusion into a vein. It is administered in a hospital or clinic by a specially trained health care professional. If you have pain, swelling, burning or any unusual feeling around the site of your injection, tell your health care professional right away. Talk to your pediatrician regarding the use of this medicine in children. Special care may be needed. Overdosage: If you think you have taken too much of this medicine contact a poison control center or emergency room at once. NOTE: This medicine is only for you. Do not share this medicine with others. What if I miss a dose? It is important not to miss your dose. Call your doctor or health care professional if you are unable to keep an appointment. What may interact with this medicine? This medicine may interact with the following medications:  6-mercaptopurine  paclitaxel  phenytoin  St. John's Wort  trastuzumab  verapamil This list may not describe all possible interactions. Give your health care provider a list of all the medicines, herbs, non-prescription drugs, or dietary supplements you use. Also tell them if you smoke, drink alcohol, or use illegal drugs. Some items may interact with your medicine. What should I watch for while using this medicine? This drug may make you feel generally unwell. This is not uncommon, as chemotherapy can affect healthy cells as well as cancer cells. Report any side effects. Continue your course of treatment even though you feel ill unless your doctor tells you to stop. There is a maximum amount  this medicine you should receive throughout your life. The amount depends on the medical condition being treated and your overall health. Your doctor will watch how much of this medicine you receive in your lifetime. Tell your doctor if you have taken this  medicine before. You may need blood work done while you are taking this medicine. Your urine may turn red for a few days after your dose. This is not blood. If your urine is dark or brown, call your doctor. In some cases, you may be given additional medicines to help with side effects. Follow all directions for their use. Call your doctor or health care professional for advice if you get a fever, chills or sore throat, or other symptoms of a cold or flu. Do not treat yourself. This drug decreases your body's ability to fight infections. Try to avoid being around people who are sick. This medicine may increase your risk to bruise or bleed. Call your doctor or health care professional if you notice any unusual bleeding. Talk to your doctor about your risk of cancer. You may be more at risk for certain types of cancers if you take this medicine. Do not become pregnant while taking this medicine or for 6 months after stopping it. Women should inform their doctor if they wish to become pregnant or think they might be pregnant. Men should not father a child while taking this medicine and for 6 months after stopping it. There is a potential for serious side effects to an unborn child. Talk to your health care professional or pharmacist for more information. Do not breast-feed an infant while taking this medicine. This medicine has caused ovarian failure in some women and reduced sperm counts in some men This medicine may interfere with the ability to have a child. Talk with your doctor or health care professional if you are concerned about your fertility. This medicine may cause a decrease in Co-Enzyme Q-10. You should make sure that you get enough Co-Enzyme Q-10 while you are taking this medicine. Discuss the foods you eat and the vitamins you take with your health care professional. What side effects may I notice from receiving this medicine? Side effects that you should report to your doctor or health care  professional as soon as possible:  allergic reactions like skin rash, itching or hives, swelling of the face, lips, or tongue  breathing problems  chest pain  fast or irregular heartbeat  low blood counts - this medicine may decrease the number of white blood cells, red blood cells and platelets. You may be at increased risk for infections and bleeding.  pain, redness, or irritation at site where injected  signs of infection - fever or chills, cough, sore throat, pain or difficulty passing urine  signs of decreased platelets or bleeding - bruising, pinpoint red spots on the skin, black, tarry stools, blood in the urine  swelling of the ankles, feet, hands  tiredness  weakness Side effects that usually do not require medical attention (report to your doctor or health care professional if they continue or are bothersome):  diarrhea  hair loss  mouth sores  nail discoloration or damage  nausea  red colored urine  vomiting This list may not describe all possible side effects. Call your doctor for medical advice about side effects. You may report side effects to FDA at 1-800-FDA-1088. Where should I keep my medicine? This drug is given in a hospital or clinic and will not be stored at home. NOTE:   NOTE: This sheet is a summary. It may not cover all possible information. If you have questions about this medicine, talk to your doctor, pharmacist, or health care provider.  2021 Elsevier/Gold Standard (2017-07-05 11:01:26)  Vinblastine injection What is this medicine? VINBLASTINE (vin BLAS teen) is a chemotherapy drug. It slows the growth of cancer cells. This medicine is used to treat many types of cancer like breast cancer, testicular cancer, Hodgkin's disease, non-Hodgkin's lymphoma, and sarcoma. This medicine may be used for other purposes; ask your health care provider or pharmacist if you have questions. COMMON BRAND NAME(S): Velban What should I tell my health care provider  before I take this medicine? They need to know if you have any of these conditions:  blood disorders  dental disease  gout  infection (especially a virus infection such as chickenpox, cold sores, or herpes)  liver disease  lung disease  nervous system disease  recent or ongoing radiation therapy  an unusual or allergic reaction to vinblastine, other chemotherapy agents, other medicines, foods, dyes, or preservatives  pregnant or trying to get pregnant  breast-feeding How should I use this medicine? This drug is given as an infusion into a vein. It is administered in a hospital or clinic by a specially trained health care professional. If you have pain, swelling, burning or any unusual feeling around the site of your injection, tell your health care professional right away. Talk to your pediatrician regarding the use of this medicine in children. While this drug may be prescribed for selected conditions, precautions do apply. Overdosage: If you think you have taken too much of this medicine contact a poison control center or emergency room at once. NOTE: This medicine is only for you. Do not share this medicine with others. What if I miss a dose? It is important not to miss your dose. Call your doctor or health care professional if you are unable to keep an appointment. What may interact with this medicine?  erythromycin  certain medicines for fungal infections like itraconazole, ketoconazole, posaconazole, voriconazole  certain medicines for seizures like phenytoin This list may not describe all possible interactions. Give your health care provider a list of all the medicines, herbs, non-prescription drugs, or dietary supplements you use. Also tell them if you smoke, drink alcohol, or use illegal drugs. Some items may interact with your medicine. What should I watch for while using this medicine? Your condition will be monitored carefully while you are receiving this medicine.  You will need important blood work done while you are taking this medicine. This drug may make you feel generally unwell. This is not uncommon, as chemotherapy can affect healthy cells as well as cancer cells. Report any side effects. Continue your course of treatment even though you feel ill unless your doctor tells you to stop. In some cases, you may be given additional medicines to help with side effects. Follow all directions for their use. Call your doctor or health care professional for advice if you get a fever, chills or sore throat, or other symptoms of a cold or flu. Do not treat yourself. This drug decreases your body's ability to fight infections. Try to avoid being around people who are sick. This medicine may increase your risk to bruise or bleed. Call your doctor or health care professional if you notice any unusual bleeding. Be careful brushing and flossing your teeth or using a toothpick because you may get an infection or bleed more easily. If you have any dental  work done, tell your dentist you are receiving this medicine. Avoid taking products that contain aspirin, acetaminophen, ibuprofen, naproxen, or ketoprofen unless instructed by your doctor. These medicines may hide a fever. Do not become pregnant while taking this medicine. Women should inform their doctor if they wish to become pregnant or think they might be pregnant. There is a potential for serious side effects to an unborn child. Talk to your health care professional or pharmacist for more information. Do not breast-feed an infant while taking this medicine. Men may have a lower sperm count while taking this medicine. Talk to your doctor if you plan to father a child. What side effects may I notice from receiving this medicine? Side effects that you should report to your doctor or health care professional as soon as possible:  allergic reactions like skin rash, itching or hives, swelling of the face, lips, or tongue  low  blood counts - This drug may decrease the number of white blood cells, red blood cells and platelets. You may be at increased risk for infections and bleeding.  signs of infection - fever or chills, cough, sore throat, pain or difficulty passing urine  signs of decreased platelets or bleeding - bruising, pinpoint red spots on the skin, black, tarry stools, nosebleeds  signs of decreased red blood cells - unusually weak or tired, fainting spells, lightheadedness  breathing problems  changes in hearing  change in the amount of urine  chest pain  high blood pressure  mouth sores  nausea and vomiting  pain, swelling, redness or irritation at the injection site  pain, tingling, numbness in the hands or feet  problems with balance, dizziness  seizures Side effects that usually do not require medical attention (report to your doctor or health care professional if they continue or are bothersome):  constipation  hair loss  jaw pain  loss of appetite  sensitivity to light  stomach pain  tumor pain This list may not describe all possible side effects. Call your doctor for medical advice about side effects. You may report side effects to FDA at 1-800-FDA-1088. Where should I keep my medicine? This drug is given in a hospital or clinic and will not be stored at home. NOTE: This sheet is a summary. It may not cover all possible information. If you have questions about this medicine, talk to your doctor, pharmacist, or health care provider.  2021 Elsevier/Gold Standard (2019-10-22 16:42:23)  Dacarbazine, DTIC injection What is this medicine? DACARBAZINE (da KAR ba zeen) is a chemotherapy drug. This medicine is used to treat skin cancer. It is also used with other medicines to treat Hodgkin's disease. This medicine may be used for other purposes; ask your health care provider or pharmacist if you have questions. COMMON BRAND NAME(S): DTIC-Dome What should I tell my health  care provider before I take this medicine? They need to know if you have any of these conditions:  infection (especially virus infection such as chickenpox, cold sores, or herpes)  kidney disease  liver disease  low blood counts like low platelets, red blood cells, white blood cells  recent radiation therapy  an unusual or allergic reaction to dacarbazine, other chemotherapy agents, other medicines, foods, dyes, or preservatives  pregnant or trying to get pregnant  breast-feeding How should I use this medicine? This drug is given as an injection or infusion into a vein. It is administered in a hospital or clinic by a specially trained health care professional. Talk to your pediatrician  regarding the use of this medicine in children. While this drug may be prescribed for selected conditions, precautions do apply. Overdosage: If you think you have taken too much of this medicine contact a poison control center or emergency room at once. NOTE: This medicine is only for you. Do not share this medicine with others. What if I miss a dose? It is important not to miss your dose. Call your doctor or health care professional if you are unable to keep an appointment. What may interact with this medicine?  medicines to increase blood counts like filgrastim, pegfilgrastim, sargramostim  vaccines This list may not describe all possible interactions. Give your health care provider a list of all the medicines, herbs, non-prescription drugs, or dietary supplements you use. Also tell them if you smoke, drink alcohol, or use illegal drugs. Some items may interact with your medicine. What should I watch for while using this medicine? Your condition will be monitored carefully while you are receiving this medicine. You will need important blood work done while you are taking this medicine. This drug may make you feel generally unwell. This is not uncommon, as chemotherapy can affect healthy cells as well  as cancer cells. Report any side effects. Continue your course of treatment even though you feel ill unless your doctor tells you to stop. Call your doctor or health care professional for advice if you get a fever, chills or sore throat, or other symptoms of a cold or flu. Do not treat yourself. This drug decreases your body's ability to fight infections. Try to avoid being around people who are sick. This medicine may increase your risk to bruise or bleed. Call your doctor or health care professional if you notice any unusual bleeding. Talk to your doctor about your risk of cancer. You may be more at risk for certain types of cancers if you take this medicine. Do not become pregnant while taking this medicine. Women should inform their doctor if they wish to become pregnant or think they might be pregnant. There is a potential for serious side effects to an unborn child. Talk to your health care professional or pharmacist for more information. Do not breast-feed an infant while taking this medicine. What side effects may I notice from receiving this medicine? Side effects that you should report to your doctor or health care professional as soon as possible:  allergic reactions like skin rash, itching or hives, swelling of the face, lips, or tongue  low blood counts - this medicine may decrease the number of white blood cells, red blood cells and platelets. You may be at increased risk for infections and bleeding.  signs of infection - fever or chills, cough, sore throat, pain or difficulty passing urine  signs of decreased platelets or bleeding - bruising, pinpoint red spots on the skin, black, tarry stools, blood in the urine  signs of decreased red blood cells - unusually weak or tired, fainting spells, lightheadedness  breathing problems  muscle pains  pain at site where injected  trouble passing urine or change in the amount of urine  vomiting  yellowing of the eyes or skin Side  effects that usually do not require medical attention (report to your doctor or health care professional if they continue or are bothersome):  diarrhea  hair loss  loss of appetite  nausea  skin more sensitive to sun or ultraviolet light  stomach upset This list may not describe all possible side effects. Call your doctor for medical   advice about side effects. You may report side effects to FDA at 1-800-FDA-1088. Where should I keep my medicine? This drug is given in a hospital or clinic and will not be stored at home. NOTE: This sheet is a summary. It may not cover all possible information. If you have questions about this medicine, talk to your doctor, pharmacist, or health care provider.  2021 Elsevier/Gold Standard (2016-01-22 15:17:39)

## 2020-12-21 ENCOUNTER — Telehealth: Payer: Self-pay | Admitting: *Deleted

## 2020-12-21 NOTE — Telephone Encounter (Signed)
Called pt to see how he did with his treatment last week.  Received vm & left message to call us back.

## 2020-12-21 NOTE — Telephone Encounter (Signed)
-----   Message from Rolene Course, RN sent at 12/18/2020  5:19 PM EST ----- Regarding: [name] 1st Tx F/U call - Adriamycin, Vinblastine, DTIC [name] 1st Tx F/U call - Adriamycin, Vinblastine, DTIC

## 2020-12-29 ENCOUNTER — Other Ambulatory Visit: Payer: Self-pay | Admitting: Hematology and Oncology

## 2020-12-29 DIAGNOSIS — C8121 Mixed cellularity classical Hodgkin lymphoma, lymph nodes of head, face, and neck: Secondary | ICD-10-CM

## 2020-12-30 ENCOUNTER — Inpatient Hospital Stay: Payer: BC Managed Care – PPO

## 2020-12-30 ENCOUNTER — Encounter: Payer: Self-pay | Admitting: Hematology and Oncology

## 2020-12-30 ENCOUNTER — Inpatient Hospital Stay (HOSPITAL_BASED_OUTPATIENT_CLINIC_OR_DEPARTMENT_OTHER): Payer: BC Managed Care – PPO | Admitting: Hematology and Oncology

## 2020-12-30 ENCOUNTER — Other Ambulatory Visit: Payer: Self-pay

## 2020-12-30 ENCOUNTER — Encounter: Payer: Self-pay | Admitting: *Deleted

## 2020-12-30 VITALS — BP 146/86 | HR 92 | Temp 99.2°F | Resp 20 | Ht 70.0 in | Wt 261.6 lb

## 2020-12-30 DIAGNOSIS — C8121 Mixed cellularity classical Hodgkin lymphoma, lymph nodes of head, face, and neck: Secondary | ICD-10-CM

## 2020-12-30 DIAGNOSIS — Z95828 Presence of other vascular implants and grafts: Secondary | ICD-10-CM

## 2020-12-30 DIAGNOSIS — Z5111 Encounter for antineoplastic chemotherapy: Secondary | ICD-10-CM | POA: Diagnosis not present

## 2020-12-30 DIAGNOSIS — Z79899 Other long term (current) drug therapy: Secondary | ICD-10-CM | POA: Diagnosis not present

## 2020-12-30 DIAGNOSIS — Z87891 Personal history of nicotine dependence: Secondary | ICD-10-CM | POA: Diagnosis not present

## 2020-12-30 DIAGNOSIS — K219 Gastro-esophageal reflux disease without esophagitis: Secondary | ICD-10-CM | POA: Diagnosis not present

## 2020-12-30 DIAGNOSIS — C61 Malignant neoplasm of prostate: Secondary | ICD-10-CM | POA: Diagnosis not present

## 2020-12-30 DIAGNOSIS — I10 Essential (primary) hypertension: Secondary | ICD-10-CM | POA: Diagnosis not present

## 2020-12-30 LAB — CBC WITH DIFFERENTIAL (CANCER CENTER ONLY)
Abs Immature Granulocytes: 0.01 10*3/uL (ref 0.00–0.07)
Basophils Absolute: 0 10*3/uL (ref 0.0–0.1)
Basophils Relative: 1 %
Eosinophils Absolute: 0.1 10*3/uL (ref 0.0–0.5)
Eosinophils Relative: 3 %
HCT: 40.5 % (ref 39.0–52.0)
Hemoglobin: 13.8 g/dL (ref 13.0–17.0)
Immature Granulocytes: 0 %
Lymphocytes Relative: 62 %
Lymphs Abs: 1.5 10*3/uL (ref 0.7–4.0)
MCH: 28.9 pg (ref 26.0–34.0)
MCHC: 34.1 g/dL (ref 30.0–36.0)
MCV: 84.9 fL (ref 80.0–100.0)
Monocytes Absolute: 0.4 10*3/uL (ref 0.1–1.0)
Monocytes Relative: 19 %
Neutro Abs: 0.4 10*3/uL — CL (ref 1.7–7.7)
Neutrophils Relative %: 15 %
Platelet Count: 266 10*3/uL (ref 150–400)
RBC: 4.77 MIL/uL (ref 4.22–5.81)
RDW: 12.8 % (ref 11.5–15.5)
WBC Count: 2.4 10*3/uL — ABNORMAL LOW (ref 4.0–10.5)
nRBC: 0 % (ref 0.0–0.2)

## 2020-12-30 LAB — CMP (CANCER CENTER ONLY)
ALT: 41 U/L (ref 0–44)
AST: 28 U/L (ref 15–41)
Albumin: 4 g/dL (ref 3.5–5.0)
Alkaline Phosphatase: 53 U/L (ref 38–126)
Anion gap: 7 (ref 5–15)
BUN: 10 mg/dL (ref 6–20)
CO2: 25 mmol/L (ref 22–32)
Calcium: 9.4 mg/dL (ref 8.9–10.3)
Chloride: 104 mmol/L (ref 98–111)
Creatinine: 0.84 mg/dL (ref 0.61–1.24)
GFR, Estimated: >60 mL/min (ref >60)
Glucose, Bld: 120 mg/dL — ABNORMAL HIGH (ref 70–99)
Potassium: 3.8 mmol/L (ref 3.5–5.1)
Sodium: 136 mmol/L (ref 135–145)
Total Bilirubin: 0.3 mg/dL (ref 0.3–1.2)
Total Protein: 7.4 g/dL (ref 6.5–8.1)

## 2020-12-30 LAB — LACTATE DEHYDROGENASE: LDH: 134 U/L (ref 98–192)

## 2020-12-30 LAB — URIC ACID: Uric Acid, Serum: 5 mg/dL (ref 3.7–8.6)

## 2020-12-30 MED ORDER — HEPARIN SOD (PORK) LOCK FLUSH 100 UNIT/ML IV SOLN
500.0000 [IU] | Freq: Once | INTRAVENOUS | Status: AC
  Administered 2020-12-30: 500 [IU] via INTRAVENOUS
  Filled 2020-12-30: qty 5

## 2020-12-30 MED ORDER — SODIUM CHLORIDE 0.9% FLUSH
10.0000 mL | INTRAVENOUS | Status: DC | PRN
  Administered 2020-12-30: 10 mL via INTRAVENOUS
  Filled 2020-12-30: qty 10

## 2020-12-30 NOTE — Progress Notes (Signed)
[place] Telephone:([phone]   [phone]  PROGRESS NOTE  Patient Care Team: London Pepper, MD as PCP - General (Family Medicine)  Hematological/Oncological History # Classical Hodgkin's Lymphoma, Mixed Cellularity. 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 2) 10/27/2020: resection of neck mass. Pathology showed classical Hodgkin's lymphoma, mixed cellularity subtype 3) 11/13/2020: establish care with Dr. Lorenso Courier  4) 11/26/2020: PET CT scan shows single hypermetabolic unenlarged right level II cervical lymph node identified on today's study. FDG uptake is compatible with [place] 4. Other small bilateral cervical and upper normal to borderline hepatoduodenal ligament lymph nodes show [place] 2 uptake levels 5) 12/18/2020: Cycle 1 Day 1 of AVD chemotherapy   Interval History:  [name] 60 y.o. male with medical history significant for Classical Hodgkin's lymphoma Early Stage/Unfavorable risk who presents for a follow up visit. The patient's last visit was on 12/11/2020. In the interim since the last visit he has started Cycle 1 of AVD chemotherapy.  On exam today Mr. Mahaney that he tolerated the first cycle of chemotherapy relatively well.  He notes that after the first cycle he did have a flareup of his arthritis which coincided with the chemotherapy and the drop in the temperature.  He notes that he did not have any issues with diarrhea, nausea, vomiting, though he did have some headaches after treatment.  He reports that he has taken 2 nausea pills in the interim since his last chemo.  He was taken these because he "felt woozy".  He has had some weakness and decreased energy reports that after a brief nap he does feel better.  He also wears out more quickly than usual.  He has been trying to walk in the neighborhood in order to keep his energy levels up.  His appetite is  been "okay".  And that he ate quite well last night.  He otherwise denies any fevers, chills, sweats, nausea, vomiting or diarrhea.  A full 10 point ROS is listed below.  MEDICAL HISTORY:  Past Medical History:  Diagnosis Date  . Cancer ([place]) 10/15   recent dx prostate ca-no tx yet  . GERD (gastroesophageal reflux disease)   . Hypertension   . Pneumonia    history  . Teeth decayed     SURGICAL HISTORY: Past Surgical History:  Procedure Laterality Date  . IR IMAGING GUIDED PORT INSERTION  12/11/2020  . MASS BIOPSY Left 09/29/2014   Procedure: LEFT LOWER NECK MASS EXCISION;  Surgeon: Izora Gala, MD;  Location: [facility];  Service: ENT;  Laterality: Left;  . PAROTIDECTOMY Left 09/29/2014   Procedure: LEFT PAROTIDECTOMY;  Surgeon: Izora Gala, MD;  Location: [facility];  Service: ENT;  Laterality: Left;  . SEPTOPLASTY  1976    SOCIAL HISTORY: Social History   Socioeconomic History  . Marital status: Widowed    Spouse name: Not on file  . Number of children: Not on file  . Years of education: Not on file  . Highest education level: Not on file  Occupational History  . Not on file  Tobacco Use  . Smoking status: Former Smoker    Packs/day: 3.00    Years: 32.00    Pack years: 96.00    Types: Cigarettes    Quit date: 11/26/2017    Years since quitting: 3.0  . Smokeless tobacco: Never Used  Substance and Sexual Activity  . Alcohol use: Yes  Comment: twice a year  . Drug use: No  . Sexual activity: Not on file  Other Topics Concern  . Not on file  Social History Narrative  . Not on file   Social Determinants of Health   Financial Resource Strain: Not on file  Food Insecurity: Not on file  Transportation Needs: Not on file  Physical Activity: Not on file  Stress: Not on file  Social Connections: Not on file  Intimate Partner Violence: Not on file    FAMILY HISTORY: Family History  Problem Relation Age of Onset  . Stroke Mother    . Heart disease Father     ALLERGIES:  has No Known Allergies.  MEDICATIONS:  Current Outpatient Medications  Medication Sig Dispense Refill  . acetaminophen (TYLENOL) 500 MG tablet Take 500 mg by mouth every 6 (six) hours as needed for headache.    Marland Kitchen amLODipine (NORVASC) 5 MG tablet Take 5 mg by mouth daily.    Marland Kitchen lidocaine-prilocaine (EMLA) cream Apply 1 application topically as needed. 30 g 0  . losartan (COZAAR) 100 MG tablet Take 100 mg by mouth daily.    . metFORMIN (GLUCOPHAGE) 500 MG tablet Take 1 tablet by mouth 2 (two) times daily.    . ondansetron (ZOFRAN) 8 MG tablet Take 1 tablet (8 mg total) by mouth every 8 (eight) hours as needed for nausea or vomiting. 39 tablet 0  . pantoprazole (PROTONIX) 40 MG tablet Take 1 tablet by mouth daily.    . prochlorperazine (COMPAZINE) 10 MG tablet Take 1 tablet (10 mg total) by mouth every 6 (six) hours as needed for nausea or vomiting. 30 tablet 0  . rosuvastatin (CRESTOR) 5 MG tablet Take 5 mg by mouth daily.     No current facility-administered medications for this visit.    REVIEW OF SYSTEMS:   Constitutional: ( - ) fevers, ( - )  chills , ( - ) night sweats Eyes: ( - ) blurriness of vision, ( - ) double vision, ( - ) watery eyes Ears, nose, mouth, throat, and face: ( - ) mucositis, ( - ) sore throat Respiratory: ( - ) cough, ( - ) dyspnea, ( - ) wheezes Cardiovascular: ( - ) palpitation, ( - ) chest discomfort, ( - ) lower extremity swelling Gastrointestinal:  ( - ) nausea, ( - ) heartburn, ( - ) change in bowel habits Skin: ( - ) abnormal skin rashes Lymphatics: ( - ) new lymphadenopathy, ( - ) easy bruising Neurological: ( - ) numbness, ( - ) tingling, ( - ) new weaknesses Behavioral/Psych: ( - ) mood change, ( - ) new changes  All other systems were reviewed with the patient and are negative.  PHYSICAL EXAMINATION: ECOG PERFORMANCE STATUS: 1 - Symptomatic but completely ambulatory  Vitals:   12/30/20 1545  BP: (!) 146/86   Pulse: 92  Resp: 20  Temp: 99.2 F (37.3 C)  SpO2: 98%   Filed Weights   12/30/20 1545  Weight: 261 lb 9.6 oz (118.7 kg)    GENERAL: well appearing middle aged Caucasian male in NAD  SKIN: skin color, texture, turgor are normal, no rashes or significant lesions EYES: conjunctiva are pink and non-injected, sclera clear NECK: supple, non-tender LYMPH:  Prior surgical scars on right neck, some palpable tissue (unclear if residual node or scar tissue) no palpable lymphadenopathy in the cervical, axillary or supraclavicular lymph nodes.  LUNGS: clear to auscultation and percussion with normal breathing effort HEART: regular rate & rhythm  and no murmurs and no lower extremity edema Musculoskeletal: no cyanosis of digits and no clubbing  PSYCH: alert & oriented x 3, fluent speech NEURO: no focal motor/sensory deficits  LABORATORY DATA:  I have reviewed the data as listed CBC Latest Ref Rng & Units 12/30/2020 12/18/2020 12/11/2020  WBC 4.0 - 10.5 K/uL 2.4(L) 6.4 6.0  Hemoglobin 13.0 - 17.0 g/dL 13.8 15.2 15.0  Hematocrit 39.0 - 52.0 % 40.5 44.7 45.1  Platelets 150 - 400 K/uL 266 207 204    CMP Latest Ref Rng & Units 12/30/2020 12/18/2020 12/11/2020  Glucose 70 - 99 mg/dL 120(H) 156(H) 106(H)  BUN 6 - 20 mg/dL _0 Creatinine 0.61 - 1.24 mg/dL 0.84 1.06 1.01  Sodium 135 - 145 mmol/L 136 137 137  Potassium 3.5 - 5.1 mmol/L 3.8 4.2 3.9  Chloride 98 - 111 mmol/L 104 105 101  CO2 22 - 32 mmol/L _1 Calcium 8.9 - 10.3 mg/dL 9.4 9.6 9.4  Total Protein 6.5 - 8.1 g/dL 7.4 7.5 7.8  Total Bilirubin 0.3 - 1.2 mg/dL 0.3 0.4 0.7  Alkaline Phos 38 - 126 U/L 53 55 52  AST 15 - 41 U/L _2 ALT 0 - 44 U/L 41 25 28    No results found for: MPROTEIN No results found for: KPAFRELGTCHN, LAMBDASER, KAPLAMBRATIO   RADIOGRAPHIC STUDIES:  IR IMAGING GUIDED PORT INSERTION  Result Date: 12/11/2020 INDICATION: 60 year old with Hodgkin's lymphoma. Port-A-Cath needed for chemotherapy. EXAM:  FLUOROSCOPIC AND ULTRASOUND GUIDED PLACEMENT OF A SUBCUTANEOUS PORT COMPARISON:  None. MEDICATIONS: Ancef 2 g; The antibiotic was administered within an appropriate time interval prior to skin puncture. ANESTHESIA/SEDATION: Versed 4.0 mg IV; Fentanyl 200 mcg IV; Moderate Sedation Time:  31 minutes The patient was continuously monitored during the procedure by the interventional radiology nurse under my direct supervision. FLUOROSCOPY TIME:  12 seconds, 8 mGy COMPLICATIONS: None immediate. PROCEDURE: The procedure, risks, benefits, and alternatives were explained to the patient. Questions regarding the procedure were encouraged and answered. The patient understands and consents to the procedure. Patient was placed supine on the interventional table. Ultrasound confirmed a patent right internal jugular vein. Ultrasound image was saved for documentation. The right chest and neck were cleaned with a skin antiseptic and a sterile drape was placed. Maximal barrier sterile technique was utilized including caps, mask, sterile gowns, sterile gloves, sterile drape, hand hygiene and skin antiseptic. The right neck was anesthetized with 1% lidocaine. Small incision was made in the right neck with a blade. Micropuncture set was placed in the right internal jugular vein with ultrasound guidance. The micropuncture wire was used for measurement purposes. The right chest was anesthetized with 1% lidocaine with epinephrine. #15 blade was used to make an incision and a subcutaneous port pocket was formed. [place] was assembled. Subcutaneous tunnel was formed with a stiff tunneling device. The port catheter was brought through the subcutaneous tunnel. The port was placed in the subcutaneous pocket. The micropuncture set was exchanged for a peel-away sheath. The catheter was placed through the peel-away sheath and the tip was positioned at the superior cavoatrial junction. Catheter placement was confirmed with fluoroscopy.  The port was accessed and flushed with heparinized saline. The port pocket was closed using two layers of absorbable sutures and Dermabond. The vein skin site was closed using a single layer of absorbable suture and Dermabond. Sterile dressings were applied. Patient tolerated the procedure well without an immediate complication. Ultrasound and fluoroscopic  images were taken and saved for this procedure. IMPRESSION: Placement of a subcutaneous port device. Catheter tip at the superior cavoatrial junction. Electronically Signed   By: Markus Daft M.D.   On: 12/11/2020 12:59    ASSESSMENT & PLAN [name] 60 y.o. male with medical history significant for Classical Hodgkin's lymphoma Early Stage/Unfavorable risk who presents for a follow up visit.  After review the labs, the records, review of the PFTs, and review the echocardiogram the findings are most consistent with an early stage/unfavorable risk classical Hodgkin's lymphoma.  Given that he meets unfavorable criteria via multiple guidelines I would recommend that we proceed with treatment as recommended in the NCCN guidelines for patients with early stage unfavorable disease (which is the exact same treatment recommended for stage III-IV disease).  Previously we discussed the AVD chemotherapy regimen.  We discussed the expected side effects including possible hair loss, nausea, vomiting, diarrhea, constipation, cytopenias, neutropenia, cardiac dysfunction, and neurological damage.  We discussed that the patient is an early stage unfavorable risk disease meaning he will require 2 cycles of chemotherapy followed by interval PET CT scan to determine the rest of the course moving forward.  The patient voices understanding of the risks and benefits of this chemotherapy and was willing to proceed with treatment.  The treatment of choice for this patient will be a AVD chemotherapy for 2 cycles followed by a PET CT scan.  If response is noted on this PET CT scan  we will proceed with a further 4 cycles of AVD  therapy.  The patient will be treated with every 2 week rounds of chemotherapy using this regimen. AVD chemotherapy consists of doxorubicin 25 mg/m on days 1 and 15, vinblastine 6 mg per metered squared IV on days 1 and 15, and dacarbazine 375 mg/m IV on days 1 and 15.  # Classical Hodgkin's Lymphoma, Mixed Cellularity. 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 There are some smaller lymph nodes in the neck and one near the hepatoduodenal ligament, though would favor diagnosis of early stage --based on these criteria the recommendation would be for AVD x 2 cycles with interval PET CT scan followed by AVD x 4 cycles (assuming good response on interval PET).  --today is Cycle 1 Day 15 of AVD chemotherapy.  --will avoid bleomycin in this patient due to smoking history, mild abnormalities on PFTs and age --RTC for Cycle 2 Day 1 of treatment in 2 weeks  #Neutropenia, Severe --expected with this chemotherapy regimen. The use of GCSF with this chemotherapy is not recommended as these patients rarely get neutropenic fever despite low ANCs  #Supportive Care --chemotherapy education to be scheduled  --zofran 36m q8H PRN and compazine 117mPO q6H for nausea -- EMLA cream for port -- no pain medication required at this time.   No orders of the defined types were placed in this encounter.  All questions were answered. The patient knows to call the clinic with any problems, questions or concerns.  A total of more than 30 minutes were spent on this encounter and over half of that time was spent on counseling and coordination of care as outlined above.   JoLedell PeoplesMD Department of Hematology/Oncology [facility] [place]: [phone]:  [phone] Email: [email or web address]  12/30/2020 4:45 PM

## 2020-12-30 NOTE — Progress Notes (Signed)
Critical value, ANC 0.4.  Dr Lorenso Courier notified @ [phone].  No new orders.

## 2021-01-01 ENCOUNTER — Other Ambulatory Visit: Payer: Self-pay | Admitting: *Deleted

## 2021-01-01 ENCOUNTER — Telehealth: Payer: Self-pay | Admitting: Hematology and Oncology

## 2021-01-01 ENCOUNTER — Inpatient Hospital Stay: Payer: BC Managed Care – PPO

## 2021-01-01 ENCOUNTER — Other Ambulatory Visit: Payer: Self-pay

## 2021-01-01 VITALS — BP 148/78 | HR 78 | Temp 98.2°F | Resp 18

## 2021-01-01 DIAGNOSIS — C8121 Mixed cellularity classical Hodgkin lymphoma, lymph nodes of head, face, and neck: Secondary | ICD-10-CM

## 2021-01-01 DIAGNOSIS — C61 Malignant neoplasm of prostate: Secondary | ICD-10-CM | POA: Diagnosis not present

## 2021-01-01 DIAGNOSIS — K219 Gastro-esophageal reflux disease without esophagitis: Secondary | ICD-10-CM | POA: Diagnosis not present

## 2021-01-01 DIAGNOSIS — I10 Essential (primary) hypertension: Secondary | ICD-10-CM | POA: Diagnosis not present

## 2021-01-01 DIAGNOSIS — Z79899 Other long term (current) drug therapy: Secondary | ICD-10-CM | POA: Diagnosis not present

## 2021-01-01 DIAGNOSIS — Z5111 Encounter for antineoplastic chemotherapy: Secondary | ICD-10-CM | POA: Diagnosis not present

## 2021-01-01 DIAGNOSIS — Z87891 Personal history of nicotine dependence: Secondary | ICD-10-CM | POA: Diagnosis not present

## 2021-01-01 MED ORDER — DOXORUBICIN HCL CHEMO IV INJECTION 2 MG/ML
25.0000 mg/m2 | Freq: Once | INTRAVENOUS | Status: AC
  Administered 2021-01-01: 62 mg via INTRAVENOUS
  Filled 2021-01-01: qty 31

## 2021-01-01 MED ORDER — SODIUM CHLORIDE 0.9 % IV SOLN
150.0000 mg | Freq: Once | INTRAVENOUS | Status: AC
  Administered 2021-01-01: 150 mg via INTRAVENOUS
  Filled 2021-01-01: qty 150

## 2021-01-01 MED ORDER — PALONOSETRON HCL INJECTION 0.25 MG/5ML
0.2500 mg | Freq: Once | INTRAVENOUS | Status: AC
  Administered 2021-01-01: 0.25 mg via INTRAVENOUS

## 2021-01-01 MED ORDER — VINBLASTINE SULFATE CHEMO INJECTION 1 MG/ML
6.0000 mg/m2 | Freq: Once | INTRAVENOUS | Status: AC
  Administered 2021-01-01: 14.6 mg via INTRAVENOUS
  Filled 2021-01-01: qty 14.6

## 2021-01-01 MED ORDER — SODIUM CHLORIDE 0.9 % IV SOLN
Freq: Once | INTRAVENOUS | Status: AC
  Filled 2021-01-01: qty 250

## 2021-01-01 MED ORDER — SODIUM CHLORIDE 0.9 % IV SOLN
10.0000 mg | Freq: Once | INTRAVENOUS | Status: AC
  Administered 2021-01-01: 10 mg via INTRAVENOUS
  Filled 2021-01-01: qty 10

## 2021-01-01 MED ORDER — SODIUM CHLORIDE 0.9 % IV SOLN
375.0000 mg/m2 | Freq: Once | INTRAVENOUS | Status: AC
  Administered 2021-01-01: 920 mg via INTRAVENOUS
  Filled 2021-01-01: qty 92

## 2021-01-01 MED ORDER — B COMPLEX FORMULA 1 (W/ FA) PO TABS: 1.0000 | ORAL_TABLET | Freq: Every day | ORAL | 3 refills | Status: DC

## 2021-01-01 NOTE — Telephone Encounter (Signed)
Scheduled per 1/26 los. Called pt and left a msg  

## 2021-01-01 NOTE — Progress Notes (Signed)
Ok to treat today per Dr. Irene Limbo with ANC of 0.4

## 2021-01-01 NOTE — Progress Notes (Signed)
Pt discharged in no apparent distress. Pt left ambulatory without assistance. Pt aware of discharge instructions and verbalized understanding and had no further questions.  

## 2021-01-01 NOTE — Patient Instructions (Signed)
[facility] Discharge Instructions for Patients Receiving Chemotherapy  Today you received the following chemotherapy agents Adriamycin, Velban, DTIC  To help prevent nausea and vomiting after your treatment, we encourage you to take your nausea medication as prescribed.   If you develop nausea and vomiting that is not controlled by your nausea medication, call the clinic.   BELOW ARE SYMPTOMS THAT SHOULD BE REPORTED IMMEDIATELY:  *FEVER GREATER THAN 100.5 F  *CHILLS WITH OR WITHOUT FEVER  NAUSEA AND VOMITING THAT IS NOT CONTROLLED WITH YOUR NAUSEA MEDICATION  *UNUSUAL SHORTNESS OF BREATH  *UNUSUAL BRUISING OR BLEEDING  TENDERNESS IN MOUTH AND THROAT WITH OR WITHOUT PRESENCE OF ULCERS  *URINARY PROBLEMS  *BOWEL PROBLEMS  UNUSUAL RASH Items with * indicate a potential emergency and should be followed up as soon as possible.  Feel free to call the clinic should you have any questions or concerns. The clinic phone number is [phone].  Please show the [facility] at check-in to the Emergency Department and triage nurse.  Doxorubicin injection What is this medicine? DOXORUBICIN (dox oh ROO bi sin) is a chemotherapy drug. It is used to treat many kinds of cancer like leukemia, lymphoma, neuroblastoma, sarcoma, and Wilms' tumor. It is also used to treat bladder cancer, breast cancer, lung cancer, ovarian cancer, stomach cancer, and thyroid cancer. This medicine may be used for other purposes; ask your health care provider or pharmacist if you have questions. COMMON BRAND NAME(S): Adriamycin, Adriamycin PFS, Adriamycin RDF, Rubex What should I tell my health care provider before I take this medicine? They need to know if you have any of these conditions:  heart disease  history of low blood counts caused by a medicine  liver disease  recent or ongoing radiation therapy  an unusual or allergic reaction to doxorubicin, other chemotherapy agents, other  medicines, foods, dyes, or preservatives  pregnant or trying to get pregnant  breast-feeding How should I use this medicine? This drug is given as an infusion into a vein. It is administered in a hospital or clinic by a specially trained health care professional. If you have pain, swelling, burning or any unusual feeling around the site of your injection, tell your health care professional right away. Talk to your pediatrician regarding the use of this medicine in children. Special care may be needed. Overdosage: If you think you have taken too much of this medicine contact a poison control center or emergency room at once. NOTE: This medicine is only for you. Do not share this medicine with others. What if I miss a dose? It is important not to miss your dose. Call your doctor or health care professional if you are unable to keep an appointment. What may interact with this medicine? This medicine may interact with the following medications:  6-mercaptopurine  paclitaxel  phenytoin  St. John's Wort  trastuzumab  verapamil This list may not describe all possible interactions. Give your health care provider a list of all the medicines, herbs, non-prescription drugs, or dietary supplements you use. Also tell them if you smoke, drink alcohol, or use illegal drugs. Some items may interact with your medicine. What should I watch for while using this medicine? This drug may make you feel generally unwell. This is not uncommon, as chemotherapy can affect healthy cells as well as cancer cells. Report any side effects. Continue your course of treatment even though you feel ill unless your doctor tells you to stop. There is a maximum amount  this medicine you should receive throughout your life. The amount depends on the medical condition being treated and your overall health. Your doctor will watch how much of this medicine you receive in your lifetime. Tell your doctor if you have taken this  medicine before. You may need blood work done while you are taking this medicine. Your urine may turn red for a few days after your dose. This is not blood. If your urine is dark or brown, call your doctor. In some cases, you may be given additional medicines to help with side effects. Follow all directions for their use. Call your doctor or health care professional for advice if you get a fever, chills or sore throat, or other symptoms of a cold or flu. Do not treat yourself. This drug decreases your body's ability to fight infections. Try to avoid being around people who are sick. This medicine may increase your risk to bruise or bleed. Call your doctor or health care professional if you notice any unusual bleeding. Talk to your doctor about your risk of cancer. You may be more at risk for certain types of cancers if you take this medicine. Do not become pregnant while taking this medicine or for 6 months after stopping it. Women should inform their doctor if they wish to become pregnant or think they might be pregnant. Men should not father a child while taking this medicine and for 6 months after stopping it. There is a potential for serious side effects to an unborn child. Talk to your health care professional or pharmacist for more information. Do not breast-feed an infant while taking this medicine. This medicine has caused ovarian failure in some women and reduced sperm counts in some men This medicine may interfere with the ability to have a child. Talk with your doctor or health care professional if you are concerned about your fertility. This medicine may cause a decrease in Co-Enzyme Q-10. You should make sure that you get enough Co-Enzyme Q-10 while you are taking this medicine. Discuss the foods you eat and the vitamins you take with your health care professional. What side effects may I notice from receiving this medicine? Side effects that you should report to your doctor or health care  professional as soon as possible:  allergic reactions like skin rash, itching or hives, swelling of the face, lips, or tongue  breathing problems  chest pain  fast or irregular heartbeat  low blood counts - this medicine may decrease the number of white blood cells, red blood cells and platelets. You may be at increased risk for infections and bleeding.  pain, redness, or irritation at site where injected  signs of infection - fever or chills, cough, sore throat, pain or difficulty passing urine  signs of decreased platelets or bleeding - bruising, pinpoint red spots on the skin, black, tarry stools, blood in the urine  swelling of the ankles, feet, hands  tiredness  weakness Side effects that usually do not require medical attention (report to your doctor or health care professional if they continue or are bothersome):  diarrhea  hair loss  mouth sores  nail discoloration or damage  nausea  red colored urine  vomiting This list may not describe all possible side effects. Call your doctor for medical advice about side effects. You may report side effects to FDA at 1-800-FDA-1088. Where should I keep my medicine? This drug is given in a hospital or clinic and will not be stored at home. NOTE:   NOTE: This sheet is a summary. It may not cover all possible information. If you have questions about this medicine, talk to your doctor, pharmacist, or health care provider.  2021 Else[employer]/Gold Standard (2017-07-05 11:01:26)  Vinblastine injection What is this medicine? VINBLASTINE (vin BLAS teen) is a chemotherapy drug. It slows the growth of cancer cells. This medicine is used to treat many types of cancer like breast cancer, testicular cancer, Hodgkin's disease, non-Hodgkin's lymphoma, and sarcoma. This medicine may be used for other purposes; ask your health care provider or pharmacist if you have questions. COMMON BRAND NAME(S): Velban What should I tell my health care provider  before I take this medicine? They need to know if you have any of these conditions:  blood disorders  dental disease  gout  infection (especially a virus infection such as chickenpox, cold sores, or herpes)  liver disease  lung disease  nervous system disease  recent or ongoing radiation therapy  an unusual or allergic reaction to vinblastine, other chemotherapy agents, other medicines, foods, dyes, or preservatives  pregnant or trying to get pregnant  breast-feeding How should I use this medicine? This drug is given as an infusion into a vein. It is administered in a hospital or clinic by a specially trained health care professional. If you have pain, swelling, burning or any unusual feeling around the site of your injection, tell your health care professional right away. Talk to your pediatrician regarding the use of this medicine in children. While this drug may be prescribed for selected conditions, precautions do apply. Overdosage: If you think you have taken too much of this medicine contact a poison control center or emergency room at once. NOTE: This medicine is only for you. Do not share this medicine with others. What if I miss a dose? It is important not to miss your dose. Call your doctor or health care professional if you are unable to keep an appointment. What may interact with this medicine?  erythromycin  certain medicines for fungal infections like itraconazole, ketoconazole, posaconazole, voriconazole  certain medicines for seizures like phenytoin This list may not describe all possible interactions. Give your health care provider a list of all the medicines, herbs, non-prescription drugs, or dietary supplements you use. Also tell them if you smoke, drink alcohol, or use illegal drugs. Some items may interact with your medicine. What should I watch for while using this medicine? Your condition will be monitored carefully while you are receiving this medicine.  You will need important blood work done while you are taking this medicine. This drug may make you feel generally unwell. This is not uncommon, as chemotherapy can affect healthy cells as well as cancer cells. Report any side effects. Continue your course of treatment even though you feel ill unless your doctor tells you to stop. In some cases, you may be given additional medicines to help with side effects. Follow all directions for their use. Call your doctor or health care professional for advice if you get a fever, chills or sore throat, or other symptoms of a cold or flu. Do not treat yourself. This drug decreases your body's ability to fight infections. Try to avoid being around people who are sick. This medicine may increase your risk to bruise or bleed. Call your doctor or health care professional if you notice any unusual bleeding. Be careful brushing and flossing your teeth or using a toothpick because you may get an infection or bleed more easily. If you have any dental  work done, tell your dentist you are receiving this medicine. Avoid taking products that contain aspirin, acetaminophen, ibuprofen, naproxen, or ketoprofen unless instructed by your doctor. These medicines may hide a fever. Do not become pregnant while taking this medicine. Women should inform their doctor if they wish to become pregnant or think they might be pregnant. There is a potential for serious side effects to an unborn child. Talk to your health care professional or pharmacist for more information. Do not breast-feed an infant while taking this medicine. Men may have a lower sperm count while taking this medicine. Talk to your doctor if you plan to father a child. What side effects may I notice from receiving this medicine? Side effects that you should report to your doctor or health care professional as soon as possible:  allergic reactions like skin rash, itching or hives, swelling of the face, lips, or tongue  low  blood counts - This drug may decrease the number of white blood cells, red blood cells and platelets. You may be at increased risk for infections and bleeding.  signs of infection - fever or chills, cough, sore throat, pain or difficulty passing urine  signs of decreased platelets or bleeding - bruising, pinpoint red spots on the skin, black, tarry stools, nosebleeds  signs of decreased red blood cells - unusually weak or tired, fainting spells, lightheadedness  breathing problems  changes in hearing  change in the amount of urine  chest pain  high blood pressure  mouth sores  nausea and vomiting  pain, swelling, redness or irritation at the injection site  pain, tingling, numbness in the hands or feet  problems with balance, dizziness  seizures Side effects that usually do not require medical attention (report to your doctor or health care professional if they continue or are bothersome):  constipation  hair loss  jaw pain  loss of appetite  sensitivity to light  stomach pain  tumor pain This list may not describe all possible side effects. Call your doctor for medical advice about side effects. You may report side effects to FDA at 1-800-FDA-1088. Where should I keep my medicine? This drug is given in a hospital or clinic and will not be stored at home. NOTE: This sheet is a summary. It may not cover all possible information. If you have questions about this medicine, talk to your doctor, pharmacist, or health care provider.  2021 [employer]/Gold Standard (2019-10-22 16:42:23)  Dacarbazine, DTIC injection What is this medicine? DACARBAZINE (da KAR ba zeen) is a chemotherapy drug. This medicine is used to treat skin cancer. It is also used with other medicines to treat Hodgkin's disease. This medicine may be used for other purposes; ask your health care provider or pharmacist if you have questions. COMMON BRAND NAME(S): DTIC-Dome What should I tell my health  care provider before I take this medicine? They need to know if you have any of these conditions:  infection (especially virus infection such as chickenpox, cold sores, or herpes)  kidney disease  liver disease  low blood counts like low platelets, red blood cells, white blood cells  recent radiation therapy  an unusual or allergic reaction to dacarbazine, other chemotherapy agents, other medicines, foods, dyes, or preservatives  pregnant or trying to get pregnant  breast-feeding How should I use this medicine? This drug is given as an injection or infusion into a vein. It is administered in a hospital or clinic by a specially trained health care professional. Talk to your pediatrician  regarding the use of this medicine in children. While this drug may be prescribed for selected conditions, precautions do apply. Overdosage: If you think you have taken too much of this medicine contact a poison control center or emergency room at once. NOTE: This medicine is only for you. Do not share this medicine with others. What if I miss a dose? It is important not to miss your dose. Call your doctor or health care professional if you are unable to keep an appointment. What may interact with this medicine?  medicines to increase blood counts like filgrastim, pegfilgrastim, sargramostim  vaccines This list may not describe all possible interactions. Give your health care provider a list of all the medicines, herbs, non-prescription drugs, or dietary supplements you use. Also tell them if you smoke, drink alcohol, or use illegal drugs. Some items may interact with your medicine. What should I watch for while using this medicine? Your condition will be monitored carefully while you are receiving this medicine. You will need important blood work done while you are taking this medicine. This drug may make you feel generally unwell. This is not uncommon, as chemotherapy can affect healthy cells as well  as cancer cells. Report any side effects. Continue your course of treatment even though you feel ill unless your doctor tells you to stop. Call your doctor or health care professional for advice if you get a fever, chills or sore throat, or other symptoms of a cold or flu. Do not treat yourself. This drug decreases your body's ability to fight infections. Try to avoid being around people who are sick. This medicine may increase your risk to bruise or bleed. Call your doctor or health care professional if you notice any unusual bleeding. Talk to your doctor about your risk of cancer. You may be more at risk for certain types of cancers if you take this medicine. Do not become pregnant while taking this medicine. Women should inform their doctor if they wish to become pregnant or think they might be pregnant. There is a potential for serious side effects to an unborn child. Talk to your health care professional or pharmacist for more information. Do not breast-feed an infant while taking this medicine. What side effects may I notice from receiving this medicine? Side effects that you should report to your doctor or health care professional as soon as possible:  allergic reactions like skin rash, itching or hives, swelling of the face, lips, or tongue  low blood counts - this medicine may decrease the number of white blood cells, red blood cells and platelets. You may be at increased risk for infections and bleeding.  signs of infection - fever or chills, cough, sore throat, pain or difficulty passing urine  signs of decreased platelets or bleeding - bruising, pinpoint red spots on the skin, black, tarry stools, blood in the urine  signs of decreased red blood cells - unusually weak or tired, fainting spells, lightheadedness  breathing problems  muscle pains  pain at site where injected  trouble passing urine or change in the amount of urine  vomiting  yellowing of the eyes or skin Side  effects that usually do not require medical attention (report to your doctor or health care professional if they continue or are bothersome):  diarrhea  hair loss  loss of appetite  nausea  skin more sensitive to sun or ultraviolet light  stomach upset This list may not describe all possible side effects. Call your doctor for medical  advice about side effects. You may report side effects to FDA at 1-800-FDA-1088. Where should I keep my medicine? This drug is given in a hospital or clinic and will not be stored at home. NOTE: This sheet is a summary. It may not cover all possible information. If you have questions about this medicine, talk to your doctor, pharmacist, or health care provider.  2021 [employer]/Gold Standard (2016-01-22 15:17:39)

## 2021-01-15 ENCOUNTER — Other Ambulatory Visit: Payer: Self-pay

## 2021-01-15 ENCOUNTER — Inpatient Hospital Stay: Payer: BC Managed Care – PPO

## 2021-01-15 ENCOUNTER — Inpatient Hospital Stay: Payer: BC Managed Care – PPO | Admitting: Hematology and Oncology

## 2021-01-15 ENCOUNTER — Inpatient Hospital Stay: Payer: BC Managed Care – PPO | Attending: Hematology and Oncology

## 2021-01-15 ENCOUNTER — Encounter: Payer: Self-pay | Admitting: Hematology and Oncology

## 2021-01-15 VITALS — BP 157/85 | HR 89 | Temp 98.1°F | Resp 18 | Ht 70.0 in | Wt 263.3 lb

## 2021-01-15 DIAGNOSIS — D701 Agranulocytosis secondary to cancer chemotherapy: Secondary | ICD-10-CM | POA: Diagnosis not present

## 2021-01-15 DIAGNOSIS — C8121 Mixed cellularity classical Hodgkin lymphoma, lymph nodes of head, face, and neck: Secondary | ICD-10-CM | POA: Diagnosis not present

## 2021-01-15 DIAGNOSIS — Z5111 Encounter for antineoplastic chemotherapy: Secondary | ICD-10-CM | POA: Diagnosis not present

## 2021-01-15 DIAGNOSIS — T451X5A Adverse effect of antineoplastic and immunosuppressive drugs, initial encounter: Secondary | ICD-10-CM | POA: Diagnosis not present

## 2021-01-15 DIAGNOSIS — I10 Essential (primary) hypertension: Secondary | ICD-10-CM | POA: Diagnosis not present

## 2021-01-15 DIAGNOSIS — Z79899 Other long term (current) drug therapy: Secondary | ICD-10-CM | POA: Diagnosis not present

## 2021-01-15 DIAGNOSIS — A419 Sepsis, unspecified organism: Secondary | ICD-10-CM | POA: Diagnosis not present

## 2021-01-15 DIAGNOSIS — D709 Neutropenia, unspecified: Secondary | ICD-10-CM | POA: Diagnosis not present

## 2021-01-15 DIAGNOSIS — R509 Fever, unspecified: Secondary | ICD-10-CM | POA: Diagnosis not present

## 2021-01-15 DIAGNOSIS — C61 Malignant neoplasm of prostate: Secondary | ICD-10-CM | POA: Insufficient documentation

## 2021-01-15 DIAGNOSIS — Z95828 Presence of other vascular implants and grafts: Secondary | ICD-10-CM

## 2021-01-15 DIAGNOSIS — Z87891 Personal history of nicotine dependence: Secondary | ICD-10-CM | POA: Insufficient documentation

## 2021-01-15 LAB — CBC WITH DIFFERENTIAL (CANCER CENTER ONLY)
Abs Immature Granulocytes: 0.02 10*3/uL (ref 0.00–0.07)
Basophils Absolute: 0 10*3/uL (ref 0.0–0.1)
Basophils Relative: 1 %
Eosinophils Absolute: 0.1 10*3/uL (ref 0.0–0.5)
Eosinophils Relative: 4 %
HCT: 40.5 % (ref 39.0–52.0)
Hemoglobin: 13.5 g/dL (ref 13.0–17.0)
Immature Granulocytes: 1 %
Lymphocytes Relative: 47 %
Lymphs Abs: 1.3 10*3/uL (ref 0.7–4.0)
MCH: 28.9 pg (ref 26.0–34.0)
MCHC: 33.3 g/dL (ref 30.0–36.0)
MCV: 86.7 fL (ref 80.0–100.0)
Monocytes Absolute: 0.7 10*3/uL (ref 0.1–1.0)
Monocytes Relative: 24 %
Neutro Abs: 0.6 10*3/uL — ABNORMAL LOW (ref 1.7–7.7)
Neutrophils Relative %: 23 %
Platelet Count: 192 10*3/uL (ref 150–400)
RBC: 4.67 MIL/uL (ref 4.22–5.81)
RDW: 13.8 % (ref 11.5–15.5)
WBC Count: 2.7 10*3/uL — ABNORMAL LOW (ref 4.0–10.5)
nRBC: 0 % (ref 0.0–0.2)

## 2021-01-15 LAB — CMP (CANCER CENTER ONLY)
ALT: 53 U/L — ABNORMAL HIGH (ref 0–44)
AST: 43 U/L — ABNORMAL HIGH (ref 15–41)
Albumin: 4 g/dL (ref 3.5–5.0)
Alkaline Phosphatase: 52 U/L (ref 38–126)
Anion gap: 10 (ref 5–15)
BUN: 12 mg/dL (ref 6–20)
CO2: 23 mmol/L (ref 22–32)
Calcium: 9.2 mg/dL (ref 8.9–10.3)
Chloride: 103 mmol/L (ref 98–111)
Creatinine: 0.82 mg/dL (ref 0.61–1.24)
GFR, Estimated: >60 mL/min (ref >60)
Glucose, Bld: 137 mg/dL — ABNORMAL HIGH (ref 70–99)
Potassium: 4 mmol/L (ref 3.5–5.1)
Sodium: 136 mmol/L (ref 135–145)
Total Bilirubin: 0.5 mg/dL (ref 0.3–1.2)
Total Protein: 7.4 g/dL (ref 6.5–8.1)

## 2021-01-15 LAB — LACTATE DEHYDROGENASE: LDH: 165 U/L (ref 98–192)

## 2021-01-15 MED ORDER — SODIUM CHLORIDE 0.9 % IV SOLN
Freq: Once | INTRAVENOUS | Status: AC
  Filled 2021-01-15: qty 250

## 2021-01-15 MED ORDER — SODIUM CHLORIDE 0.9 % IV SOLN
150.0000 mg | Freq: Once | INTRAVENOUS | Status: AC
  Administered 2021-01-15: 150 mg via INTRAVENOUS
  Filled 2021-01-15: qty 150

## 2021-01-15 MED ORDER — DOXORUBICIN HCL CHEMO IV INJECTION 2 MG/ML
25.0000 mg/m2 | Freq: Once | INTRAVENOUS | Status: AC
  Administered 2021-01-15: 62 mg via INTRAVENOUS
  Filled 2021-01-15: qty 31

## 2021-01-15 MED ORDER — SODIUM CHLORIDE 0.9% FLUSH
10.0000 mL | INTRAVENOUS | Status: DC | PRN
  Administered 2021-01-15: 10 mL
  Filled 2021-01-15: qty 10

## 2021-01-15 MED ORDER — SODIUM CHLORIDE 0.9 % IV SOLN
10.0000 mg | Freq: Once | INTRAVENOUS | Status: AC
  Administered 2021-01-15: 10 mg via INTRAVENOUS
  Filled 2021-01-15: qty 10

## 2021-01-15 MED ORDER — VINBLASTINE SULFATE CHEMO INJECTION 1 MG/ML
6.0000 mg/m2 | Freq: Once | INTRAVENOUS | Status: AC
  Administered 2021-01-15: 14.6 mg via INTRAVENOUS
  Filled 2021-01-15: qty 14.6

## 2021-01-15 MED ORDER — HEPARIN SOD (PORK) LOCK FLUSH 100 UNIT/ML IV SOLN
500.0000 [IU] | Freq: Once | INTRAVENOUS | Status: AC | PRN
  Administered 2021-01-15: 500 [IU]
  Filled 2021-01-15: qty 5

## 2021-01-15 MED ORDER — PALONOSETRON HCL INJECTION 0.25 MG/5ML
INTRAVENOUS | Status: AC
  Filled 2021-01-15: qty 5

## 2021-01-15 MED ORDER — SODIUM CHLORIDE 0.9 % IV SOLN
375.0000 mg/m2 | Freq: Once | INTRAVENOUS | Status: AC
  Administered 2021-01-15: 920 mg via INTRAVENOUS
  Filled 2021-01-15: qty 92

## 2021-01-15 MED ORDER — PALONOSETRON HCL INJECTION 0.25 MG/5ML
0.2500 mg | Freq: Once | INTRAVENOUS | Status: AC
  Administered 2021-01-15: 0.25 mg via INTRAVENOUS

## 2021-01-15 NOTE — Progress Notes (Signed)
[facility] [phone]   [phone]  PROGRESS NOTE  Patient Care Team: London Pepper, MD as PCP - General (Family Medicine)  Hematological/Oncological History # Classical Hodgkin's Lymphoma, Mixed Cellularity. 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 2) 10/27/2020: resection of neck mass. Pathology showed classical Hodgkin's lymphoma, mixed cellularity subtype 3) 11/13/2020: establish care with Dr. Lorenso Courier  4) 11/26/2020: PET CT scan shows single hypermetabolic unenlarged right level II cervical lymph node identified on today's study. FDG uptake is compatible with [place] 4. Other small bilateral cervical and upper normal to borderline hepatoduodenal ligament lymph nodes show Deauville 2 uptake levels 5) 12/18/2020: Cycle 1 Day 1 of AVD chemotherapy  6) 01/15/2021: Cycle 2 Day 1 of AVD chemotherapy   Interval History:  [name] 60 y.o. male with medical history significant for Classical Hodgkin's lymphoma Early Stage/Unfavorable risk who presents for a follow up visit. The patient's last visit was on 12/11/2020. In the interim since the last visit he has completed Cycle 1 of AVD chemotherapy.  On exam today [name] ports that he tolerated his first cycle of chemotherapy relatively well.  He was having some occasional body aches and did have to take a few nausea pills his first week, but during the second week he felt quite normal.  He reports that he did the nausea pills because he was feeling "queasy" that he is not having any vomiting.  He also denies any diarrhea or constipation.  He reports that his energy has been "fair to middling".  He has been losing some hair mostly when he comes.  He did get a haircut before the start of chemotherapy due to concern for hair loss.  He denies having any issues with fevers, chills, sweats.  His appetite has been good.  A full  10 point ROS is listed below.  MEDICAL HISTORY:  Past Medical History:  Diagnosis Date  . Cancer ([place]) 10/15   recent dx prostate ca-no tx yet  . GERD (gastroesophageal reflux disease)   . Hypertension   . Pneumonia    history  . Teeth decayed     SURGICAL HISTORY: Past Surgical History:  Procedure Laterality Date  . IR IMAGING GUIDED PORT INSERTION  12/11/2020  . MASS BIOPSY Left 09/29/2014   Procedure: LEFT LOWER NECK MASS EXCISION;  Surgeon: Izora Gala, MD;  Location: [facility];  Service: ENT;  Laterality: Left;  . PAROTIDECTOMY Left 09/29/2014   Procedure: LEFT PAROTIDECTOMY;  Surgeon: Izora Gala, MD;  Location: [facility];  Service: ENT;  Laterality: Left;  . SEPTOPLASTY  1976    SOCIAL HISTORY: Social History   Socioeconomic History  . Marital status: Widowed    Spouse name: Not on file  . Number of children: Not on file  . Years of education: Not on file  . Highest education level: Not on file  Occupational History  . Not on file  Tobacco Use  . Smoking status: Former Smoker    Packs/day: 3.00    Years: 32.00    Pack years: 96.00    Types: Cigarettes    Quit date: 11/26/2017    Years since quitting: 3.1  . Smokeless tobacco: Never Used  Substance and Sexual Activity  . Alcohol use: Yes    Comment: twice a year  . Drug use: No  . Sexual activity: Not on file  Other Topics Concern  .  Not on file  Social History Narrative  . Not on file   Social Determinants of Health   Financial Resource Strain: Not on file  Food Insecurity: Not on file  Transportation Needs: Not on file  Physical Activity: Not on file  Stress: Not on file  Social Connections: Not on file  Intimate Partner Violence: Not on file    FAMILY HISTORY: Family History  Problem Relation Age of Onset  . Stroke Mother   . Heart disease Father     ALLERGIES:  has No Known Allergies.  MEDICATIONS:  Current Outpatient Medications  Medication Sig  Dispense Refill  . acetaminophen (TYLENOL) 500 MG tablet Take 500 mg by mouth every 6 (six) hours as needed for headache.    Marland Kitchen amLODipine (NORVASC) 5 MG tablet Take 5 mg by mouth every evening.    . B Complex-Folic Acid (B COMPLEX FORMULA 1, W/ FA,) TABS Take 1 tablet by mouth daily. 30 tablet 3  . lidocaine-prilocaine (EMLA) cream Apply 1 application topically as needed. (Patient taking differently: Apply 1 application topically as needed (access port).) 30 g 0  . losartan (COZAAR) 100 MG tablet Take 100 mg by mouth every evening.    . metFORMIN (GLUCOPHAGE) 500 MG tablet Take 1 tablet by mouth 2 (two) times daily.    . ondansetron (ZOFRAN) 8 MG tablet Take 1 tablet (8 mg total) by mouth every 8 (eight) hours as needed for nausea or vomiting. 39 tablet 0  . pantoprazole (PROTONIX) 40 MG tablet Take 1 tablet by mouth every evening.    . prochlorperazine (COMPAZINE) 10 MG tablet Take 1 tablet (10 mg total) by mouth every 6 (six) hours as needed for nausea or vomiting. 30 tablet 0  . rosuvastatin (CRESTOR) 5 MG tablet Take 5 mg by mouth every evening.     No current facility-administered medications for this visit.    REVIEW OF SYSTEMS:   Constitutional: ( - ) fevers, ( - )  chills , ( - ) night sweats Eyes: ( - ) blurriness of vision, ( - ) double vision, ( - ) watery eyes Ears, nose, mouth, throat, and face: ( - ) mucositis, ( - ) sore throat Respiratory: ( - ) cough, ( - ) dyspnea, ( - ) wheezes Cardiovascular: ( - ) palpitation, ( - ) chest discomfort, ( - ) lower extremity swelling Gastrointestinal:  ( - ) nausea, ( - ) heartburn, ( - ) change in bowel habits Skin: ( - ) abnormal skin rashes Lymphatics: ( - ) new lymphadenopathy, ( - ) easy bruising Neurological: ( - ) numbness, ( - ) tingling, ( - ) new weaknesses Behavioral/Psych: ( - ) mood change, ( - ) new changes  All other systems were reviewed with the patient and are negative.  PHYSICAL EXAMINATION: ECOG PERFORMANCE STATUS: 1  - Symptomatic but completely ambulatory  Vitals:   01/15/21 0805  BP: (!) 157/85  Pulse: 89  Resp: 18  Temp: 98.1 F (36.7 C)  SpO2: 97%   Filed Weights   01/15/21 0805  Weight: 263 lb 4.8 oz (119.4 kg)    GENERAL: well appearing middle aged Caucasian male in NAD  SKIN: skin color, texture, turgor are normal, no rashes or significant lesions EYES: conjunctiva are pink and non-injected, sclera clear NECK: supple, non-tender LYMPH:  Prior surgical scars on right neck, some palpable tissue (unclear if residual node or scar tissue) no palpable lymphadenopathy in the cervical, axillary or supraclavicular lymph nodes.  LUNGS:  clear to auscultation and percussion with normal breathing effort HEART: regular rate & rhythm and no murmurs and no lower extremity edema Musculoskeletal: no cyanosis of digits and no clubbing  PSYCH: alert & oriented x 3, fluent speech NEURO: no focal motor/sensory deficits  LABORATORY DATA:  I have reviewed the data as listed CBC Latest Ref Rng & Units 01/19/2021 01/18/2021 01/17/2021  WBC 4.0 - 10.5 K/uL 2.8(L) 2.0(L) 1.5(L)  Hemoglobin 13.0 - 17.0 g/dL 13.6 12.4(L) 13.4  Hematocrit 39.0 - 52.0 % 39.1 37.0(L) 40.0  Platelets 150 - 400 K/uL 109(L) 81(L) 99(L)    CMP Latest Ref Rng & Units 01/19/2021 01/18/2021 01/17/2021  Glucose 70 - 99 mg/dL 132(H) 119(H) 116(H)  BUN 6 - 20 mg/dL '12 11 13  ' Creatinine 0.61 - 1.24 mg/dL 0.78 0.80 0.95  Sodium 135 - 145 mmol/L 134(L) 134(L) 135  Potassium 3.5 - 5.1 mmol/L 4.0 3.4(L) 3.5  Chloride 98 - 111 mmol/L 100 101 102  CO2 22 - 32 mmol/L '26 23 22  ' Calcium 8.9 - 10.3 mg/dL 9.3 8.8(L) 9.1  Total Protein 6.5 - 8.1 g/dL - - 7.0  Total Bilirubin 0.3 - 1.2 mg/dL - - 0.7  Alkaline Phos 38 - 126 U/L - - 46  AST 15 - 41 U/L - - 36  ALT 0 - 44 U/L - - 48(H)    No results found for: MPROTEIN No results found for: KPAFRELGTCHN, LAMBDASER, KAPLAMBRATIO   RADIOGRAPHIC STUDIES:  DG Chest Port 1 View  Result Date:  01/17/2021 CLINICAL DATA:  Questionable sepsis EXAM: PORTABLE CHEST 1 VIEW COMPARISON:  09/06/2019 FINDINGS: The heart size and mediastinal contours are within normal limits. Right chest port catheter. Both lungs are clear. The visualized skeletal structures are unremarkable. IMPRESSION: No acute abnormality of the lungs in AP portable projection. Electronically Signed   By: Eddie Candle M.D.   On: 01/17/2021 18:28    ASSESSMENT & PLAN Iokepa Geffre 60 y.o. male with medical history significant for Classical Hodgkin's lymphoma Early Stage/Unfavorable risk who presents for a follow up visit.  After review the labs, the records, review of the PFTs, and review the echocardiogram the findings are most consistent with an early stage/unfavorable risk classical Hodgkin's lymphoma.  Given that he meets unfavorable criteria via multiple guidelines I would recommend that we proceed with treatment as recommended in the NCCN guidelines for patients with early stage unfavorable disease (which is the exact same treatment recommended for stage III-IV disease).  Previously we discussed the AVD chemotherapy regimen.  We discussed the expected side effects including possible hair loss, nausea, vomiting, diarrhea, constipation, cytopenias, neutropenia, cardiac dysfunction, and neurological damage.  We discussed that the patient is an early stage unfavorable risk disease meaning he will require 2 cycles of chemotherapy followed by interval PET CT scan to determine the rest of the course moving forward.  The patient voices understanding of the risks and benefits of this chemotherapy and was willing to proceed with treatment.  The treatment of choice for this patient will be a AVD chemotherapy for 2 cycles followed by a PET CT scan.  If response is noted on this PET CT scan we will proceed with a further 4 cycles of AVD  therapy.  The patient will be treated with every 2 week rounds of chemotherapy using this regimen. AVD  chemotherapy consists of doxorubicin 25 mg/m on days 1 and 15, vinblastine 6 mg per metered squared IV on days 1 and 15, and dacarbazine 375 mg/m IV  on days 1 and 15.  # Classical Hodgkin's Lymphoma, Mixed Cellularity. 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 There are some smaller lymph nodes in the neck and one near the hepatoduodenal ligament, though would favor diagnosis of early stage --based on these criteria the recommendation would be for AVD x 2 cycles with interval PET CT scan followed by AVD x 4 cycles (assuming good response on interval PET).  --today is Cycle 2 Day 1 of AVD chemotherapy.  --will avoid bleomycin in this patient due to smoking history, mild abnormalities on PFTs and age --RTC for Cycle 2 Day 15 of treatment in 2 weeks  #Neutropenia, Severe --expected with this chemotherapy regimen. The use of GCSF with this chemotherapy is not recommended as these patients rarely get neutropenic fever despite low ANCs  #Supportive Care --chemotherapy education to be scheduled  --zofran 20m q8H PRN and compazine 159mPO q6H for nausea -- EMLA cream for port -- no pain medication required at this time.   Orders Placed This Encounter  Procedures  . NM PET Image Restag (PS) Skull Base To Thigh    Standing Status:   Future    Standing Expiration Date:   01/15/2022    Order Specific Question:   If indicated for the ordered procedure, I authorize the administration of a radiopharmaceutical per Radiology protocol    Answer:   Yes    Order Specific Question:   Preferred imaging location?    Answer:   [facility] All questions were answered. The patient knows to call the clinic with any problems, questions or concerns.  A total of more than 30 minutes  were spent on this encounter and over half of that time was spent on counseling and coordination of care as outlined above.   JoLedell PeoplesMD Department of Hematology/Oncology [facility] Serviceshone: [phone]: [phone]: [email or web address]  01/19/2021 7:44 PM

## 2021-01-15 NOTE — Progress Notes (Signed)
Okay to treat with ANC 0.6 per Dr. Lorenso Courier

## 2021-01-15 NOTE — Patient Instructions (Signed)
[facility] Discharge Instructions for Patients Receiving Chemotherapy  Today you received the following chemotherapy agents: Adriamycin, Vinblastin, DTIC  To help prevent nausea and vomiting after your treatment, we encourage you to take your nausea medication as directed.   If you develop nausea and vomiting that is not controlled by your nausea medication, call the clinic.   BELOW ARE SYMPTOMS THAT SHOULD BE REPORTED IMMEDIATELY:  *FEVER GREATER THAN 100.5 F  *CHILLS WITH OR WITHOUT FEVER  NAUSEA AND VOMITING THAT IS NOT CONTROLLED WITH YOUR NAUSEA MEDICATION  *UNUSUAL SHORTNESS OF BREATH  *UNUSUAL BRUISING OR BLEEDING  TENDERNESS IN MOUTH AND THROAT WITH OR WITHOUT PRESENCE OF ULCERS  *URINARY PROBLEMS  *BOWEL PROBLEMS  UNUSUAL RASH Items with * indicate a potential emergency and should be followed up as soon as possible.  Feel free to call the clinic should you have any questions or concerns. The clinic phone number is [phone].  Please show the [facility] at check-in to the Emergency Department and triage nurse.

## 2021-01-15 NOTE — Patient Instructions (Signed)

## 2021-01-17 ENCOUNTER — Other Ambulatory Visit: Payer: Self-pay

## 2021-01-17 ENCOUNTER — Encounter (HOSPITAL_COMMUNITY): Payer: Self-pay

## 2021-01-17 ENCOUNTER — Inpatient Hospital Stay (HOSPITAL_COMMUNITY)
Admission: EM | Admit: 2021-01-17 | Discharge: 2021-01-19 | DRG: 809 | Disposition: A | Discharge status: Home / Self Care | Payer: BC Managed Care – PPO | Attending: Internal Medicine | Admitting: Internal Medicine

## 2021-01-17 ENCOUNTER — Emergency Department (HOSPITAL_COMMUNITY): Payer: BC Managed Care – PPO

## 2021-01-17 DIAGNOSIS — Z87891 Personal history of nicotine dependence: Secondary | ICD-10-CM

## 2021-01-17 DIAGNOSIS — C8121 Mixed cellularity classical Hodgkin lymphoma, lymph nodes of head, face, and neck: Secondary | ICD-10-CM | POA: Diagnosis not present

## 2021-01-17 DIAGNOSIS — A419 Sepsis, unspecified organism: Secondary | ICD-10-CM

## 2021-01-17 DIAGNOSIS — E785 Hyperlipidemia, unspecified: Secondary | ICD-10-CM | POA: Diagnosis present

## 2021-01-17 DIAGNOSIS — E876 Hypokalemia: Secondary | ICD-10-CM | POA: Diagnosis present

## 2021-01-17 DIAGNOSIS — I10 Essential (primary) hypertension: Secondary | ICD-10-CM | POA: Diagnosis not present

## 2021-01-17 DIAGNOSIS — E871 Hypo-osmolality and hyponatremia: Secondary | ICD-10-CM | POA: Diagnosis present

## 2021-01-17 DIAGNOSIS — C8120 Mixed cellularity classical Hodgkin lymphoma, unspecified site: Secondary | ICD-10-CM | POA: Diagnosis present

## 2021-01-17 DIAGNOSIS — D709 Neutropenia, unspecified: Principal | ICD-10-CM | POA: Diagnosis present

## 2021-01-17 DIAGNOSIS — Z8249 Family history of ischemic heart disease and other diseases of the circulatory system: Secondary | ICD-10-CM

## 2021-01-17 DIAGNOSIS — D6959 Other secondary thrombocytopenia: Secondary | ICD-10-CM | POA: Diagnosis present

## 2021-01-17 DIAGNOSIS — R5081 Fever presenting with conditions classified elsewhere: Secondary | ICD-10-CM | POA: Diagnosis present

## 2021-01-17 DIAGNOSIS — Z20822 Contact with and (suspected) exposure to covid-19: Secondary | ICD-10-CM | POA: Diagnosis present

## 2021-01-17 DIAGNOSIS — E7800 Pure hypercholesterolemia, unspecified: Secondary | ICD-10-CM | POA: Diagnosis not present

## 2021-01-17 DIAGNOSIS — G4733 Obstructive sleep apnea (adult) (pediatric): Secondary | ICD-10-CM | POA: Diagnosis present

## 2021-01-17 DIAGNOSIS — K219 Gastro-esophageal reflux disease without esophagitis: Secondary | ICD-10-CM | POA: Diagnosis present

## 2021-01-17 DIAGNOSIS — Z79899 Other long term (current) drug therapy: Secondary | ICD-10-CM

## 2021-01-17 DIAGNOSIS — Z823 Family history of stroke: Secondary | ICD-10-CM

## 2021-01-17 DIAGNOSIS — C61 Malignant neoplasm of prostate: Secondary | ICD-10-CM | POA: Diagnosis present

## 2021-01-17 DIAGNOSIS — T451X5A Adverse effect of antineoplastic and immunosuppressive drugs, initial encounter: Secondary | ICD-10-CM | POA: Diagnosis present

## 2021-01-17 DIAGNOSIS — I119 Hypertensive heart disease without heart failure: Secondary | ICD-10-CM | POA: Diagnosis present

## 2021-01-17 DIAGNOSIS — Z7984 Long term (current) use of oral hypoglycemic drugs: Secondary | ICD-10-CM

## 2021-01-17 DIAGNOSIS — D696 Thrombocytopenia, unspecified: Secondary | ICD-10-CM

## 2021-01-17 DIAGNOSIS — R6520 Severe sepsis without septic shock: Secondary | ICD-10-CM

## 2021-01-17 LAB — CBC WITH DIFFERENTIAL/PLATELET
Abs Immature Granulocytes: 0.01 10*3/uL (ref 0.00–0.07)
Basophils Absolute: 0 10*3/uL (ref 0.0–0.1)
Basophils Relative: 1 %
Eosinophils Absolute: 0.1 10*3/uL (ref 0.0–0.5)
Eosinophils Relative: 5 %
HCT: 40 % (ref 39.0–52.0)
Hemoglobin: 13.4 g/dL (ref 13.0–17.0)
Immature Granulocytes: 1 %
Lymphocytes Relative: 31 %
Lymphs Abs: 0.5 10*3/uL — ABNORMAL LOW (ref 0.7–4.0)
MCH: 28.9 pg (ref 26.0–34.0)
MCHC: 33.5 g/dL (ref 30.0–36.0)
MCV: 86.4 fL (ref 80.0–100.0)
Monocytes Absolute: 0.3 10*3/uL (ref 0.1–1.0)
Monocytes Relative: 17 %
Neutro Abs: 0.7 10*3/uL — ABNORMAL LOW (ref 1.7–7.7)
Neutrophils Relative %: 45 %
Platelets: 99 10*3/uL — ABNORMAL LOW (ref 150–400)
RBC: 4.63 MIL/uL (ref 4.22–5.81)
RDW: 13.5 % (ref 11.5–15.5)
WBC: 1.5 10*3/uL — ABNORMAL LOW (ref 4.0–10.5)
nRBC: 0 % (ref 0.0–0.2)

## 2021-01-17 LAB — COMPREHENSIVE METABOLIC PANEL
ALT: 48 U/L — ABNORMAL HIGH (ref 0–44)
AST: 36 U/L (ref 15–41)
Albumin: 4 g/dL (ref 3.5–5.0)
Alkaline Phosphatase: 46 U/L (ref 38–126)
Anion gap: 11 (ref 5–15)
BUN: 13 mg/dL (ref 6–20)
CO2: 22 mmol/L (ref 22–32)
Calcium: 9.1 mg/dL (ref 8.9–10.3)
Chloride: 102 mmol/L (ref 98–111)
Creatinine, Ser: 0.95 mg/dL (ref 0.61–1.24)
GFR, Estimated: >60 mL/min (ref >60)
Glucose, Bld: 116 mg/dL — ABNORMAL HIGH (ref 70–99)
Potassium: 3.5 mmol/L (ref 3.5–5.1)
Sodium: 135 mmol/L (ref 135–145)
Total Bilirubin: 0.7 mg/dL (ref 0.3–1.2)
Total Protein: 7 g/dL (ref 6.5–8.1)

## 2021-01-17 LAB — PROTIME-INR
INR: 1.1 (ref 0.8–1.2)
Prothrombin Time: 13.8 seconds (ref 11.4–15.2)

## 2021-01-17 LAB — RESP PANEL BY RT-PCR (FLU A&B, COVID) ARPGX2
Influenza A by PCR: NEGATIVE
Influenza B by PCR: NEGATIVE
SARS Coronavirus 2 by RT PCR: NEGATIVE

## 2021-01-17 LAB — URINALYSIS, ROUTINE W REFLEX MICROSCOPIC
Bilirubin Urine: NEGATIVE
Glucose, UA: NEGATIVE mg/dL
Hgb urine dipstick: NEGATIVE
Ketones, ur: NEGATIVE mg/dL
Leukocytes,Ua: NEGATIVE
Nitrite: NEGATIVE
Protein, ur: NEGATIVE mg/dL
Specific Gravity, Urine: 1.016 (ref 1.005–1.030)
pH: 5 (ref 5.0–8.0)

## 2021-01-17 LAB — LACTIC ACID, PLASMA
Lactic Acid, Venous: 2.1 mmol/L (ref 0.5–1.9)
Lactic Acid, Venous: 1.7 mmol/L (ref 0.5–1.9)
Lactic Acid, Venous: 1.9 mmol/L (ref 0.5–1.9)

## 2021-01-17 LAB — APTT: aPTT: 27 seconds (ref 24–36)

## 2021-01-17 MED ORDER — METRONIDAZOLE IN NACL 5-0.79 MG/ML-% IV SOLN
500.0000 mg | Freq: Once | INTRAVENOUS | Status: AC
  Administered 2021-01-17: 500 mg via INTRAVENOUS
  Filled 2021-01-17: qty 100

## 2021-01-17 MED ORDER — LACTATED RINGERS IV SOLN: INTRAVENOUS | Status: AC

## 2021-01-17 MED ORDER — LOSARTAN POTASSIUM 50 MG PO TABS
100.0000 mg | ORAL_TABLET | Freq: Every evening | ORAL | Status: DC
  Administered 2021-01-17 – 2021-01-18 (×2): 100 mg via ORAL
  Filled 2021-01-17 (×2): qty 2

## 2021-01-17 MED ORDER — ACETAMINOPHEN 500 MG PO TABS
500.0000 mg | ORAL_TABLET | Freq: Four times a day (QID) | ORAL | Status: DC | PRN
  Administered 2021-01-18 (×2): 500 mg via ORAL
  Filled 2021-01-17 (×2): qty 1

## 2021-01-17 MED ORDER — LACTATED RINGERS IV BOLUS (SEPSIS)
1000.0000 mL | Freq: Once | INTRAVENOUS | Status: AC
  Administered 2021-01-17: 1000 mL via INTRAVENOUS

## 2021-01-17 MED ORDER — VANCOMYCIN HCL IN DEXTROSE 1-5 GM/200ML-% IV SOLN: 1000.0000 mg | Freq: Once | INTRAVENOUS | Status: DC

## 2021-01-17 MED ORDER — ROSUVASTATIN CALCIUM 5 MG PO TABS
5.0000 mg | ORAL_TABLET | Freq: Every evening | ORAL | Status: DC
  Administered 2021-01-17 – 2021-01-18 (×2): 5 mg via ORAL
  Filled 2021-01-17 (×2): qty 1

## 2021-01-17 MED ORDER — VANCOMYCIN HCL 1000 MG/200ML IV SOLN
1000.0000 mg | Freq: Two times a day (BID) | INTRAVENOUS | Status: DC
  Administered 2021-01-18 – 2021-01-19 (×3): 1000 mg via INTRAVENOUS
  Filled 2021-01-17 (×3): qty 200

## 2021-01-17 MED ORDER — ENOXAPARIN SODIUM 60 MG/0.6ML ~~LOC~~ SOLN
60.0000 mg | SUBCUTANEOUS | Status: DC
  Administered 2021-01-17 – 2021-01-18 (×2): 60 mg via SUBCUTANEOUS
  Filled 2021-01-17 (×3): qty 0.6

## 2021-01-17 MED ORDER — METRONIDAZOLE IN NACL 5-0.79 MG/ML-% IV SOLN
500.0000 mg | Freq: Three times a day (TID) | INTRAVENOUS | Status: DC
  Administered 2021-01-17 – 2021-01-19 (×5): 500 mg via INTRAVENOUS
  Filled 2021-01-17 (×5): qty 100

## 2021-01-17 MED ORDER — AMLODIPINE BESYLATE 5 MG PO TABS
5.0000 mg | ORAL_TABLET | Freq: Every evening | ORAL | Status: DC
  Administered 2021-01-17 – 2021-01-18 (×2): 5 mg via ORAL
  Filled 2021-01-17 (×2): qty 1

## 2021-01-17 MED ORDER — SODIUM CHLORIDE 0.9 % IV SOLN
2.0000 g | Freq: Three times a day (TID) | INTRAVENOUS | Status: DC
  Administered 2021-01-18 – 2021-01-19 (×4): 2 g via INTRAVENOUS
  Filled 2021-01-17 (×5): qty 2

## 2021-01-17 MED ORDER — SODIUM CHLORIDE 0.9 % IV SOLN
2.0000 g | Freq: Once | INTRAVENOUS | Status: AC
  Administered 2021-01-17: 2 g via INTRAVENOUS
  Filled 2021-01-17: qty 2

## 2021-01-17 MED ORDER — VANCOMYCIN HCL 2000 MG/400ML IV SOLN
2000.0000 mg | Freq: Once | INTRAVENOUS | Status: AC
  Administered 2021-01-17: 2000 mg via INTRAVENOUS
  Filled 2021-01-17: qty 400

## 2021-01-17 MED ORDER — ONDANSETRON HCL 8 MG PO TABS: 8.0000 mg | ORAL_TABLET | Freq: Three times a day (TID) | ORAL | Status: DC | PRN

## 2021-01-17 MED ORDER — PANTOPRAZOLE SODIUM 40 MG PO TBEC
40.0000 mg | DELAYED_RELEASE_TABLET | Freq: Every evening | ORAL | Status: DC
  Administered 2021-01-17 – 2021-01-18 (×2): 40 mg via ORAL
  Filled 2021-01-17 (×2): qty 1

## 2021-01-17 NOTE — ED Notes (Signed)
Date and time results received: 01/17/21 1903 (use smartphrase ".now" to insert current time)  Test: WBC Critical Value: 1.5  Name of Provider Notified: Kathrynn Humble  Orders Received? Or Actions Taken?:Provider Notified

## 2021-01-17 NOTE — Progress Notes (Signed)
A consult was received from an ED physician for Cefepime and Vancomycin per pharmacy dosing.  The patient's profile has been reviewed for ht/wt/allergies/indication/available labs.    A one time order has been placed for Vancomycin 2gm (1gm dose order has been discontinued) and Cefepime 2gm.  Further antibiotics/pharmacy consults should be ordered by admitting physician if indicated.                       Thank you,  Minda Ditto PharmD 01/17/2021  5:47 PM

## 2021-01-17 NOTE — Progress Notes (Signed)
Pharmacy Antibiotic Note  [name] is a 60 y.o. male admitted on 01/17/2021 with febrile neutropenia.  Pharmacy has been consulted for vancomycin and cefepime dosing.  Plan: Vancomycin 2000 mg IV now, then 1000 mg IV q12 hr (est AUC 434 based on SCr 0.95; Vd 0.5) Measure vancomycin AUC at steady state as indicated SCr q48 while on vanc Cefepime 2 g IV q12 hr   Weight: 121.6 kg (268 lb 1.3 oz)  Temp (24hrs), Avg:99.1 F (37.3 C), Min:98.5 F (36.9 C), Max:99.6 F (37.6 C)  Recent Labs  Lab 01/15/21 0754 01/17/21 1811 01/17/21 2042  WBC 2.7* 1.5*  --   CREATININE 0.82 0.95  --   LATICACIDVEN  --  2.1* 1.7    Estimated Creatinine Clearance: 108.1 mL/min (by C-G formula based on SCr of 0.95 mg/dL).    No Known Allergies  Antimicrobials this admission: 2/13 vanc >>  2/13 cefepime >>   Dose adjustments this admission:  Microbiology results: 2/13 BCx: sent 2/13 UCx: sent  Thank you for allowing pharmacy to be a part of this patient's care.  Marget Outten A 01/17/2021 9:43 PM

## 2021-01-17 NOTE — ED Triage Notes (Signed)
Pt states had Chemo Friday, and today states had 100.8 fever and chills, and then went down to 99.2 about 20 minutes later. Called Cancer center and was advised to come for evaluation.

## 2021-01-17 NOTE — H&P (Signed)
History and Physical    [name] ZJI:[id number] DOB: Apr 13, 1961 DOA: 01/17/2021  PCP: London Pepper, MD  Patient coming from: Home  I have personally briefly reviewed patient's old medical records in [place]  Chief Complaint: Fever on chemotherapy   HPI: [name] is a 60 y.o. male with medical history significant for History of classic Hodgkin's lymphoma, severe neutropenia, prostate cancer, hypertension, hyperlipidemia and OSA who presents with concerns of fever.  Patient's last chemotherapy session was on 2/11.  This morning he woke up feeling unwell.  Then later in the afternoon he began to have chills and rigors.  Notes headache but denies any neck pain or stiffness.  Found his temperature to be 100.8 and presented to ED for evaluation.  He denies any new symptoms.  No cough, chest pain or shortness of breath.  No nausea, vomiting or diarrhea.  He has poor dentition but denies any new acute tooth pain.  States he felt some numbing pain to the right side of his neck where he had resection of neck mass back in 2021.  Denies any issues with his chemotherapy port.  No skin lesions or wounds.  In the ED, he was afebrile but tachycardic and mildly tachypneic on room air.  CBC shows leukopenia of 1.5 from prior of 2.7.  Absolute neutrophil count of 0.7 up from 0.6 several days ago.   Patient was started on broad-spectrum antibiotics for neutropenic fever and hospitalist was consulted for admission.   Review of Systems: Constitutional: No Weight Change, No Fever ENT/Mouth: No sore throat, No Rhinorrhea Eyes: No Eye Pain, No Vision Changes Cardiovascular: No Chest Pain, no SOB Respiratory: No Cough, No Sputum, No Wheezing, no Dyspnea  Gastrointestinal: No Nausea, No Vomiting, No Diarrhea, No Constipation, No Pain Genitourinary: no Urinary Incontinence, No Urgency, No Flank Pain Musculoskeletal: No Arthralgias, No Myalgias Skin: No Skin Lesions, No Pruritus, Neuro: no  Weakness, No Numbness,  No Loss of Consciousness, No Syncope Psych: No Anxiety/Panic, No Depression, + decrease appetite-loss of taste and smell since chemo Heme/Lymph: No Bruising, No Bleeding  Past Medical History:  Diagnosis Date  . Cancer ([place]) 10/15   recent dx prostate ca-no tx yet  . GERD (gastroesophageal reflux disease)   . Hypertension   . Pneumonia    history  . Teeth decayed     Past Surgical History:  Procedure Laterality Date  . IR IMAGING GUIDED PORT INSERTION  12/11/2020  . MASS BIOPSY Left 09/29/2014   Procedure: LEFT LOWER NECK MASS EXCISION;  Surgeon: Izora Gala, MD;  Location: [facility];  Service: ENT;  Laterality: Left;  . PAROTIDECTOMY Left 09/29/2014   Procedure: LEFT PAROTIDECTOMY;  Surgeon: Izora Gala, MD;  Location: [facility];  Service: ENT;  Laterality: Left;  . SEPTOPLASTY  1976     reports that he quit smoking about 3 years ago. His smoking use included cigarettes. He has a 96.00 pack-year smoking history. He has never used smokeless tobacco. He reports current alcohol use. He reports that he does not use drugs. Social History  No Known Allergies  Family History  Problem Relation Age of Onset  . Stroke Mother   . Heart disease Father      Prior to Admission medications   Medication Sig Start Date End Date Taking? Authorizing Provider  acetaminophen (TYLENOL) 500 MG tablet Take 500 mg by mouth every 6 (six) hours as needed for headache.    [provider]  amLODipine (NORVASC) 5  MG tablet Take 5 mg by mouth daily.    [provider]  B Complex-Folic Acid (B COMPLEX FORMULA 1, W/ FA,) TABS Take 1 tablet by mouth daily. 01/01/21   Brunetta Genera, MD  lidocaine-prilocaine (EMLA) cream Apply 1 application topically as needed. 12/14/20   Orson Slick, MD  losartan (COZAAR) 100 MG tablet Take 100 mg by mouth daily. 11/01/20   [provider]  metFORMIN (GLUCOPHAGE) 500 MG tablet Take  1 tablet by mouth 2 (two) times daily. 08/22/19   [provider]  ondansetron (ZOFRAN) 8 MG tablet Take 1 tablet (8 mg total) by mouth every 8 (eight) hours as needed for nausea or vomiting. 12/14/20   Orson Slick, MD  pantoprazole (PROTONIX) 40 MG tablet Take 1 tablet by mouth daily. 08/14/19   [provider]  prochlorperazine (COMPAZINE) 10 MG tablet Take 1 tablet (10 mg total) by mouth every 6 (six) hours as needed for nausea or vomiting. 12/14/20   Orson Slick, MD  rosuvastatin (CRESTOR) 5 MG tablet Take 5 mg by mouth daily. 10/28/20   [provider]    Physical Exam: Vitals:   01/17/21 1830 01/17/21 1900 01/17/21 1930 01/17/21 2000  BP: (!) 155/76 (!) 166/74 (!) 157/77 (!) 175/82  Pulse: (!) 106 99 97 (!) 101  Resp: 18 (!) 27 20 17   Temp:      TempSrc:      SpO2: 94% 93% 97% 95%    Constitutional: NAD, calm, comfortable, nontoxic elderly gentleman sitting upright in bed Vitals:   01/17/21 1830 01/17/21 1900 01/17/21 1930 01/17/21 2000  BP: (!) 155/76 (!) 166/74 (!) 157/77 (!) 175/82  Pulse: (!) 106 99 97 (!) 101  Resp: 18 (!) 27 20 17   Temp:      TempSrc:      SpO2: 94% 93% 97% 95%   Eyes: PERRL, lids and conjunctivae normal ENMT: Mucous membranes are moist. Posterior pharynx clear of any exudate or lesions.missing all dentition on top, poor dentition on lower teeth with dental caries noted to right molar Neck: normal, supple,  Respiratory: clear to auscultation bilaterally, no wheezing, no crackles. Normal respiratory effort. No accessory muscle use.  Cardiovascular: Regular rate and rhythm, no murmurs / rubs / gallops. No extremity edema. 2+ pedal pulses. Abdomen: no tenderness, no masses palpated.  Bowel sounds positive.  Musculoskeletal: no clubbing / cyanosis. No joint deformity upper and lower extremities. Good ROM, no contractures. Normal muscle tone.  Skin: no rashes, lesions, ulcers. No induration Neurologic: CN 2-12 grossly  intact. Sensation intact. Strength 5/5 in all 4.  Psychiatric: Normal judgment and insight. Alert and oriented x 3. Normal mood.     Labs on Admission: I have personally reviewed following labs and imaging studies  CBC: Recent Labs  Lab 01/15/21 0754 01/17/21 1811  WBC 2.7* 1.5*  NEUTROABS 0.6* 0.7*  HGB 13.5 13.4  HCT 40.5 40.0  MCV 86.7 86.4  PLT 192 99*   Basic Metabolic Panel: Recent Labs  Lab 01/15/21 0754 01/17/21 1811  NA 136 135  K 4.0 3.5  CL 103 102  CO2 23 22  GLUCOSE 137* 116*  BUN 12 13  CREATININE 0.82 0.95  CALCIUM 9.2 9.1   GFR: Estimated Creatinine Clearance: 107.1 mL/min (by C-G formula based on SCr of 0.95 mg/dL). Liver Function Tests: Recent Labs  Lab 01/15/21 0754 01/17/21 1811  AST 43* 36  ALT 53* 48*  ALKPHOS 52 46  BILITOT 0.5  0.7  PROT 7.4 7.0  ALBUMIN 4.0 4.0   No results for input(s): LIPASE, AMYLASE in the last 168 hours. No results for input(s): AMMONIA in the last 168 hours. Coagulation Profile: Recent Labs  Lab 01/17/21 1811  INR 1.1   Cardiac Enzymes: No results for input(s): CKTOTAL, CKMB, CKMBINDEX, TROPONINI in the last 168 hours. BNP (last 3 results) No results for input(s): PROBNP in the last 8760 hours. HbA1C: No results for input(s): HGBA1C in the last 72 hours. CBG: No results for input(s): GLUCAP in the last 168 hours. Lipid Profile: No results for input(s): CHOL, HDL, LDLCALC, TRIG, CHOLHDL, LDLDIRECT in the last 72 hours. Thyroid Function Tests: No results for input(s): TSH, T4TOTAL, FREET4, T3FREE, THYROIDAB in the last 72 hours. Anemia Panel: No results for input(s): VITAMINB12, FOLATE, FERRITIN, TIBC, IRON, RETICCTPCT in the last 72 hours. Urine analysis:    Component Value Date/Time   COLORURINE YELLOW 01/17/2021 1800   APPEARANCEUR CLEAR 01/17/2021 1800   LABSPEC 1.016 01/17/2021 1800   PHURINE 5.0 01/17/2021 1800   GLUCOSEU NEGATIVE 01/17/2021 1800   HGBUR NEGATIVE 01/17/2021 1800    BILIRUBINUR NEGATIVE 01/17/2021 1800   KETONESUR NEGATIVE 01/17/2021 1800   PROTEINUR NEGATIVE 01/17/2021 1800   UROBILINOGEN 0.2 09/05/2008 1845   NITRITE NEGATIVE 01/17/2021 1800   LEUKOCYTESUR NEGATIVE 01/17/2021 1800    Radiological Exams on Admission: DG Chest Port 1 View  Result Date: 01/17/2021 CLINICAL DATA:  Questionable sepsis EXAM: PORTABLE CHEST 1 VIEW COMPARISON:  09/06/2019 FINDINGS: The heart size and mediastinal contours are within normal limits. Right chest port catheter. Both lungs are clear. The visualized skeletal structures are unremarkable. IMPRESSION: No acute abnormality of the lungs in AP portable projection. Electronically Signed   By: Eddie Candle M.D.   On: 01/17/2021 18:28      Assessment/Plan  Severe sepsis from neutropenic fever with unknown infectious source Patient presented with tachycardia, mild tachypnea and lactic acid greater than 2 Continue IV vancomycin, Cefepime, flagyl blood cultures pending ANC of 0.7 on admit. Monitor with daily CBC with diff will need oncology consult tomorrow morning   Thrombocytopenia  likely due to chemo   Classic non-Hodgkin's lymphoma on chemotherapy Follows with Dr. Lorenso Courier   OSA continue CPAP  HTN Continue amlodipine, losartan  HLD  Continue lovastatin  DVT prophylaxis:.Lovenox Code Status: Full Family Communication: Plan discussed with patient at bedside  disposition Plan: Home with observation Consults called:  Admission status: Observation  Level of care: Telemetry  Status is: Observation  The patient remains OBS appropriate and will d/c before 2 midnights.  Dispo: The patient is from: Home              Anticipated d/c is to: Home              Anticipated d/c date is: 1 day              Patient currently is not medically stable to d/c.   Difficult to place patient No         Orene Desanctis DO [facility]   If 7PM-7AM, please contact  night-coverage [email or web address]   01/17/2021, 8:32 PM

## 2021-01-17 NOTE — ED Provider Notes (Signed)
[facility] DEPT Provider Note   CSN: [id number] Arrival date & time: 01/17/21  1715     History Chief Complaint  Patient presents with  . Fever    [name] is a 60 y.o. male.  HPI     60 year old male with history of Hodgkin's lymphoma comes in a chief complaint of fevers.  He is currently getting chemotherapy, last session was on Friday.  This afternoon he started developing rigors followed by fever of 100.8.  He was instructed to come to the ER.  Patient denies any recent headaches, URI-like symptoms, cough, chest pain, abdominal pain, UTI-like symptoms, rash, diarrhea.  He has a port in place.  No recent instrumentation.  Past Medical History:  Diagnosis Date  . Cancer ([place]) 10/15   recent dx prostate ca-no tx yet  . GERD (gastroesophageal reflux disease)   . Hypertension   . Pneumonia    history  . Teeth decayed     Patient Active Problem List   Diagnosis Date Noted  . 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 Parotid mass 09/29/2014  . Chest discomfort 07/29/2014  . Benign hypertensive heart disease without heart failure 07/29/2014  . Hypercholesterolemia 07/29/2014  . Mass of left submandibular region 07/29/2014  . Sinus tachycardia 07/29/2014  . Elevated PSA measurement 07/29/2014  . Cellulitis and abscess of upper arm and forearm-left olecranon fossa 03/18/2014    Past Surgical History:  Procedure Laterality Date  . IR IMAGING GUIDED PORT INSERTION  12/11/2020  . MASS BIOPSY Left 09/29/2014   Procedure: LEFT LOWER NECK MASS EXCISION;  Surgeon: Izora Gala, MD;  Location: [facility];  Service: ENT;  Laterality: Left;  . PAROTIDECTOMY Left 09/29/2014   Procedure: LEFT PAROTIDECTOMY;  Surgeon: Izora Gala, MD;  Location: [facility];  Service: ENT;  Laterality: Left;  . SEPTOPLASTY  1976       Family History  Problem Relation Age of Onset  . Stroke Mother    . Heart disease Father     Social History   Tobacco Use  . Smoking status: Former Smoker    Packs/day: 3.00    Years: 32.00    Pack years: 96.00    Types: Cigarettes    Quit date: 11/26/2017    Years since quitting: 3.1  . Smokeless tobacco: Never Used  Substance Use Topics  . Alcohol use: Yes    Comment: twice a year  . Drug use: No    Home Medications Prior to Admission medications   Medication Sig Start Date End Date Taking? Authorizing Provider  acetaminophen (TYLENOL) 500 MG tablet Take 500 mg by mouth every 6 (six) hours as needed for headache.    [provider]  amLODipine (NORVASC) 5 MG tablet Take 5 mg by mouth daily.    [provider]  B Complex-Folic Acid (B COMPLEX FORMULA 1, W/ FA,) TABS Take 1 tablet by mouth daily. 01/01/21   Brunetta Genera, MD  lidocaine-prilocaine (EMLA) cream Apply 1 application topically as needed. 12/14/20   Orson Slick, MD  losartan (COZAAR) 100 MG tablet Take 100 mg by mouth daily. 11/01/20   [provider]  metFORMIN (GLUCOPHAGE) 500 MG tablet Take 1 tablet by mouth 2 (two) times daily. 08/22/19   [provider]  ondansetron (ZOFRAN) 8 MG tablet Take 1 tablet (8 mg total) by mouth every 8 (eight) hours as needed for nausea or vomiting. 12/14/20  Orson Slick, MD  pantoprazole (PROTONIX) 40 MG tablet Take 1 tablet by mouth daily. 08/14/19   [provider]  prochlorperazine (COMPAZINE) 10 MG tablet Take 1 tablet (10 mg total) by mouth every 6 (six) hours as needed for nausea or vomiting. 12/14/20   Orson Slick, MD  rosuvastatin (CRESTOR) 5 MG tablet Take 5 mg by mouth daily. 10/28/20   [provider]    Allergies    Patient has no known allergies.  Review of Systems   Review of Systems  Constitutional: Positive for activity change, chills, fatigue and fever.  Respiratory: Negative for shortness of breath.   Cardiovascular: Negative for chest pain.   Gastrointestinal: Negative for nausea and vomiting.  Genitourinary: Negative for dysuria.  Skin: Negative for rash.  Allergic/Immunologic: Positive for immunocompromised state.  Neurological: Negative for headaches.  All other systems reviewed and are negative.   Physical Exam Updated Vital Signs BP (!) 175/82   Pulse (!) 101   Temp 98.5 F (36.9 C) (Oral)   Resp 17   SpO2 95%   Physical Exam Vitals and nursing note reviewed.  Constitutional:      Appearance: He is well-developed.  HENT:     Head: Atraumatic.  Cardiovascular:     Rate and Rhythm: Normal rate.  Pulmonary:     Effort: Pulmonary effort is normal.  Musculoskeletal:     Cervical back: Neck supple.  Skin:    General: Skin is warm.     Findings: No rash.  Neurological:     Mental Status: He is alert and oriented to person, place, and time.     ED Results / Procedures / Treatments   Labs (all labs ordered are listed, but only abnormal results are displayed) Labs Reviewed  LACTIC ACID, PLASMA - Abnormal; Notable for the following components:      Result Value   Lactic Acid, Venous 2.1 (*)    All other components within normal limits  COMPREHENSIVE METABOLIC PANEL - Abnormal; Notable for the following components:   Glucose, Bld 116 (*)    ALT 48 (*)    All other components within normal limits  CBC WITH DIFFERENTIAL/PLATELET - Abnormal; Notable for the following components:   WBC 1.5 (*)    Platelets 99 (*)    Neutro Abs 0.7 (*)    Lymphs Abs 0.5 (*)    All other components within normal limits  RESP PANEL BY RT-PCR (FLU A&B, COVID) ARPGX2  CULTURE, BLOOD (ROUTINE X 2)  CULTURE, BLOOD (ROUTINE X 2)  URINE CULTURE  PROTIME-INR  APTT  URINALYSIS, ROUTINE W REFLEX MICROSCOPIC  LACTIC ACID, PLASMA    EKG None  Radiology DG Chest Port 1 View  Result Date: 01/17/2021 CLINICAL DATA:  Questionable sepsis EXAM: PORTABLE CHEST 1 VIEW COMPARISON:  09/06/2019 FINDINGS: The heart size and mediastinal  contours are within normal limits. Right chest port catheter. Both lungs are clear. The visualized skeletal structures are unremarkable. IMPRESSION: No acute abnormality of the lungs in AP portable projection. Electronically Signed   By: Eddie Candle M.D.   On: 01/17/2021 18:28    Procedures .Critical Care Performed by: Varney Biles, MD Authorized by: Varney Biles, MD   Critical care provider statement:    Critical care time (minutes):  32   Critical care was time spent personally by me on the following activities:  Discussions with consultants, evaluation of patient's response to treatment, examination of patient, ordering and performing treatments and interventions, ordering  and review of laboratory studies, ordering and review of radiographic studies, pulse oximetry, re-evaluation of patient's condition, obtaining history from patient or surrogate and review of old charts     Medications Ordered in ED Medications  lactated ringers infusion ( Intravenous New Bag/Given 01/17/21 1810)  vancomycin (VANCOREADY) IVPB 2000 mg/400 mL (has no administration in time range)  lactated ringers bolus 1,000 mL (1,000 mLs Intravenous New Bag/Given 01/17/21 1809)  ceFEPIme (MAXIPIME) 2 g in sodium chloride 0.9 % 100 mL IVPB (0 g Intravenous Stopped 01/17/21 1833)  metroNIDAZOLE (FLAGYL) IVPB 500 mg (500 mg Intravenous New Bag/Given 01/17/21 1833)    ED Course  I have reviewed the triage vital signs and the nursing notes.  Pertinent labs & imaging results that were available during my care of the patient were reviewed by me and considered in my medical decision making (see chart for details).    MDM Rules/Calculators/A&P                          60 year old male comes in with chief complaint of fevers.  He has history of cancer and is on chemotherapy for it.  Patient is neutropenic.  He started developing fevers and rigors earlier today.  History is not suggestive of any specific source of  infection. Exam was reassuring, including the port site.  We will initiate appropriate work-up and start antibiotics.  Code sepsis will be activated.   Final Clinical Impression(s) / ED Diagnoses Final diagnoses:  Neutropenic fever [facility])    Rx / DC Orders ED Discharge Orders    None       Varney Biles, MD 01/17/21 2009

## 2021-01-17 NOTE — ED Notes (Signed)
Date and time results received: 01/17/21  1911  (use smartphrase ".now" to insert current time)  Test: Lactic Acid Critical Value: 2.1   Name of Provider Notified Dr. Kathrynn Humble  Orders Received? Or Actions Taken?:

## 2021-01-17 NOTE — Progress Notes (Signed)
Pt. declined CPAP for this evening , made aware to notify if wanting to use.

## 2021-01-17 NOTE — Sepsis Progress Note (Signed)
Sepsis protocol is being monitored by e[facility]. 

## 2021-01-18 ENCOUNTER — Telehealth: Payer: Self-pay | Admitting: Hematology and Oncology

## 2021-01-18 ENCOUNTER — Encounter (HOSPITAL_COMMUNITY): Payer: Self-pay | Admitting: Family Medicine

## 2021-01-18 DIAGNOSIS — R5081 Fever presenting with conditions classified elsewhere: Secondary | ICD-10-CM

## 2021-01-18 DIAGNOSIS — Z8249 Family history of ischemic heart disease and other diseases of the circulatory system: Secondary | ICD-10-CM | POA: Diagnosis not present

## 2021-01-18 DIAGNOSIS — I119 Hypertensive heart disease without heart failure: Secondary | ICD-10-CM | POA: Diagnosis present

## 2021-01-18 DIAGNOSIS — Z79899 Other long term (current) drug therapy: Secondary | ICD-10-CM | POA: Diagnosis not present

## 2021-01-18 DIAGNOSIS — C61 Malignant neoplasm of prostate: Secondary | ICD-10-CM | POA: Diagnosis present

## 2021-01-18 DIAGNOSIS — K219 Gastro-esophageal reflux disease without esophagitis: Secondary | ICD-10-CM | POA: Diagnosis present

## 2021-01-18 DIAGNOSIS — E871 Hypo-osmolality and hyponatremia: Secondary | ICD-10-CM | POA: Diagnosis present

## 2021-01-18 DIAGNOSIS — T451X5A Adverse effect of antineoplastic and immunosuppressive drugs, initial encounter: Secondary | ICD-10-CM | POA: Diagnosis present

## 2021-01-18 DIAGNOSIS — Z20822 Contact with and (suspected) exposure to covid-19: Secondary | ICD-10-CM | POA: Diagnosis present

## 2021-01-18 DIAGNOSIS — R509 Fever, unspecified: Secondary | ICD-10-CM | POA: Diagnosis present

## 2021-01-18 DIAGNOSIS — D709 Neutropenia, unspecified: Secondary | ICD-10-CM | POA: Diagnosis present

## 2021-01-18 DIAGNOSIS — E785 Hyperlipidemia, unspecified: Secondary | ICD-10-CM | POA: Diagnosis present

## 2021-01-18 DIAGNOSIS — D6959 Other secondary thrombocytopenia: Secondary | ICD-10-CM | POA: Diagnosis present

## 2021-01-18 DIAGNOSIS — Z87891 Personal history of nicotine dependence: Secondary | ICD-10-CM | POA: Diagnosis not present

## 2021-01-18 DIAGNOSIS — C8120 Mixed cellularity classical Hodgkin lymphoma, unspecified site: Secondary | ICD-10-CM | POA: Diagnosis present

## 2021-01-18 DIAGNOSIS — Z7984 Long term (current) use of oral hypoglycemic drugs: Secondary | ICD-10-CM | POA: Diagnosis not present

## 2021-01-18 DIAGNOSIS — E7800 Pure hypercholesterolemia, unspecified: Secondary | ICD-10-CM | POA: Diagnosis present

## 2021-01-18 DIAGNOSIS — G4733 Obstructive sleep apnea (adult) (pediatric): Secondary | ICD-10-CM | POA: Diagnosis present

## 2021-01-18 DIAGNOSIS — Z823 Family history of stroke: Secondary | ICD-10-CM | POA: Diagnosis not present

## 2021-01-18 DIAGNOSIS — E876 Hypokalemia: Secondary | ICD-10-CM | POA: Diagnosis present

## 2021-01-18 LAB — BASIC METABOLIC PANEL
Anion gap: 10 (ref 5–15)
BUN: 11 mg/dL (ref 6–20)
CO2: 23 mmol/L (ref 22–32)
Calcium: 8.8 mg/dL — ABNORMAL LOW (ref 8.9–10.3)
Chloride: 101 mmol/L (ref 98–111)
Creatinine, Ser: 0.8 mg/dL (ref 0.61–1.24)
GFR, Estimated: >60 mL/min (ref >60)
Glucose, Bld: 119 mg/dL — ABNORMAL HIGH (ref 70–99)
Potassium: 3.4 mmol/L — ABNORMAL LOW (ref 3.5–5.1)
Sodium: 134 mmol/L — ABNORMAL LOW (ref 135–145)

## 2021-01-18 LAB — CBC WITH DIFFERENTIAL/PLATELET
Abs Immature Granulocytes: 0 10*3/uL (ref 0.00–0.07)
Basophils Absolute: 0 10*3/uL (ref 0.0–0.1)
Basophils Relative: 1 %
Eosinophils Absolute: 0.1 10*3/uL (ref 0.0–0.5)
Eosinophils Relative: 5 %
HCT: 37 % — ABNORMAL LOW (ref 39.0–52.0)
Hemoglobin: 12.4 g/dL — ABNORMAL LOW (ref 13.0–17.0)
Immature Granulocytes: 0 %
Lymphocytes Relative: 33 %
Lymphs Abs: 0.7 10*3/uL (ref 0.7–4.0)
MCH: 29 pg (ref 26.0–34.0)
MCHC: 33.5 g/dL (ref 30.0–36.0)
MCV: 86.7 fL (ref 80.0–100.0)
Monocytes Absolute: 0.4 10*3/uL (ref 0.1–1.0)
Monocytes Relative: 18 %
Neutro Abs: 0.9 10*3/uL — ABNORMAL LOW (ref 1.7–7.7)
Neutrophils Relative %: 43 %
Platelets: 81 10*3/uL — ABNORMAL LOW (ref 150–400)
RBC: 4.27 MIL/uL (ref 4.22–5.81)
RDW: 13.4 % (ref 11.5–15.5)
WBC: 2 10*3/uL — ABNORMAL LOW (ref 4.0–10.5)
nRBC: 0 % (ref 0.0–0.2)

## 2021-01-18 LAB — HIV ANTIBODY (ROUTINE TESTING W REFLEX): HIV Screen 4th Generation wRfx: NONREACTIVE

## 2021-01-18 MED ORDER — POTASSIUM CHLORIDE CRYS ER 20 MEQ PO TBCR
20.0000 meq | EXTENDED_RELEASE_TABLET | Freq: Once | ORAL | Status: AC
  Administered 2021-01-18: 20 meq via ORAL
  Filled 2021-01-18: qty 1

## 2021-01-18 MED ORDER — CHLORHEXIDINE GLUCONATE CLOTH 2 % EX PADS
6.0000 | MEDICATED_PAD | Freq: Every day | CUTANEOUS | Status: DC
  Administered 2021-01-18 – 2021-01-19 (×2): 6 via TOPICAL

## 2021-01-18 NOTE — Telephone Encounter (Signed)
No 2/11 los. No changes made to pt's schedule.

## 2021-01-18 NOTE — Progress Notes (Signed)
PROGRESS NOTE    [name]  HCW:[id number] DOB: Nov 07, 1961 DOA: 01/17/2021 PCP: London Pepper, MD   Chief Complaint  Patient presents with  . Fever  Brief Narrative: 37m w/ Hodgkin's lymphoma currently on chemo, prostate cancer, hypertension, hyperlipidemia, OSA presents with fever.  He had his last chemo on 2/11.  He woke up feeling unwell and later in the afternoon began to have chills and rigor headache but no neck pain stiffness. In the ED afebrile, tachycardic, mildly tachypneic, saturating well on room air, blood work showed leukopenia 1500 with ANC 0.7.  Patient was placed on broad-spectrum antibiotics and admitted.  Subjective: Seen and examined this morning.  Denies any cough chest pain dysuria abdominal pain. Overnight T-max 99.1.  Mildly tachycardic blood pressure stable. Lab with potassium 3.4 lactic acid 2.1 has resolved Wbc up  2000 with ANC 0.9  Assessment & Plan:  Neutropenic fever/SIRS W/ leukopenia, fever tachycardia:Chest x-ray no pneumonia UA fairly stable.  Blood culture and culture pending.  Patient has been on broad-spectrum antibiotic with cefepime/vancomycin/Flagyl.  Will request hematology eval.  WBC increasing. Recent Labs  Lab 01/15/21 0754 01/17/21 1811 01/18/21 0709  WBC 2.7* 1.5* 2.0*   Hypokalemia/hyponatremia encourage oral intake, add potassium chloride orally  Hypercholesterolemia: Continue patient's statin.  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  Thrombocytopenia: Platelet downtrending.  Suspect due to chemotherapy. Recent Labs  Lab 01/15/21 0754 01/17/21 1811 01/18/21 0709  PLT 192 99* 81*   HTN: Blood pressure controlled on amlodipine and losartan.  Continue to monitor.  Morbid obesity with BMI 38.4/OSA refused CPAP here, uses at home. PCP follow-up for weight loss.  Nutrition: Diet Order            DIET SOFT Room service appropriate? Yes; Fluid consistency: Thin  Diet  effective now               Pt's Body mass index is 38.47 kg/m. DVT prophylaxis: Lovenox-watch plt Code Status:   Code Status: Full Code  Family Communication: plan of care discussed with patient at bedside. Discussed with oncology this morning  Status is: admitted as Observation Continues to require hospitalization for ongoing management of neutropenic fever, awaiting culture. Dispo: The patient is from: Home              Anticipated d/c is to: Home              Anticipated d/c date is: 2 days              Patient currently is not medically stable to d/c.   Difficult to place patient No  Consultants:see note  Procedures:see note  Unresulted Labs (From admission, onward)          Start     Ordered   01/18/21 0500  Creatinine, serum  Every 48 hours,   R     Comments: While on vancomycin - may discontinue once vanc stopped    01/17/21 2142   01/17/21 2025  HIV Antibody (routine testing w rflx)  (HIV Antibody (Routine testing w reflex) panel)  Once,   STAT        01/17/21 2027   01/17/21 1744  Blood Culture (routine x 2)  (Septic presentation on arrival (screening labs, nursing and treatment orders for obvious sepsis))  BLOOD CULTURE X 2,   STAT      01/17/21 1744   01/17/21 1744  Urine culture  (Septic presentation on arrival (screening labs, nursing and  treatment orders for obvious sepsis))  ONCE - STAT,   STAT        01/17/21 1744          Culture/Microbiology    Component Value Date/Time   SDES STOOL 09/07/2008 1938   SPECREQUEST NONE 09/07/2008 1938   CULT  09/06/2008 1740    NO SALMONELLA, SHIGELLA, CAMPYLOBACTER, OR YERSINIA ISOLATED Note: REDUCED NORMAL FLORA PRESENT   REPTSTATUS 09/09/2008 FINAL 09/07/2008 1938    Other culture-see note  Medications: Scheduled Meds: . amLODipine  5 mg Oral QPM  . enoxaparin (LOVENOX) injection  60 mg Subcutaneous Q24H  . losartan  100 mg Oral QPM  . pantoprazole  40 mg Oral QPM  . rosuvastatin  5 mg Oral QPM    Continuous Infusions: . ceFEPime (MAXIPIME) IV 2 g (01/18/21 0216)  . lactated ringers 150 mL/hr at 01/18/21 0504  . metronidazole 500 mg (01/18/21 0505)  . vancomycin 1,000 mg (01/18/21 0506)    Antimicrobials: Anti-infectives (From admission, onward)   Start     Dose/Rate Route Frequency Ordered Stop   01/18/21 0600  vancomycin (VANCOREADY) IVPB 1000 mg/200 mL        1,000 mg 200 mL/hr over 60 Minutes Intravenous Every 12 hours 01/17/21 2142     01/18/21 0200  ceFEPIme (MAXIPIME) 2 g in sodium chloride 0.9 % 100 mL IVPB        2 g 200 mL/hr over 30 Minutes Intravenous Every 8 hours 01/17/21 2142     01/17/21 2200  metroNIDAZOLE (FLAGYL) IVPB 500 mg        500 mg 100 mL/hr over 60 Minutes Intravenous Every 8 hours 01/17/21 2105     01/17/21 1800  vancomycin (VANCOREADY) IVPB 2000 mg/400 mL        2,000 mg 200 mL/hr over 120 Minutes Intravenous  Once 01/17/21 1752 01/17/21 2208   01/17/21 1745  ceFEPIme (MAXIPIME) 2 g in sodium chloride 0.9 % 100 mL IVPB        2 g 200 mL/hr over 30 Minutes Intravenous  Once 01/17/21 1744 01/17/21 1833   01/17/21 1745  metroNIDAZOLE (FLAGYL) IVPB 500 mg        500 mg 100 mL/hr over 60 Minutes Intravenous  Once 01/17/21 1744 01/17/21 2007   01/17/21 1745  vancomycin (VANCOCIN) IVPB 1000 mg/200 mL premix  Status:  Discontinued        1,000 mg 200 mL/hr over 60 Minutes Intravenous  Once 01/17/21 1744 01/17/21 1750     Objective: Vitals: Today's Vitals   01/17/21 2117 01/17/21 2126 01/18/21 0114 01/18/21 0502  BP: (!) 155/86  (!) 153/76 (!) 145/74  Pulse: 98  84 85  Resp: 18     Temp: 99.6 F (37.6 C)  98 F (36.7 C) 98.5 F (36.9 C)  TempSrc: Oral  Oral Oral  SpO2: 96%  96% 95%  Weight: 121.6 kg     PainSc:  0-No pain      Intake/Output Summary (Last 24 hours) at 01/18/2021 0747 Last data filed at 01/18/2021 0600 Gross per 24 hour  Intake 2271.3 ml  Output 1200 ml  Net 1071.3 ml   Filed Weights   01/17/21 2117  Weight: 121.6  kg   Weight change:   Intake/Output from previous day: 02/13 0701 - 02/14 0700 In: 2271.3 [P.O.:350; I.V.:1463.6; IV Piggyback:457.7] Out: 1200 [Urine:1200] Intake/Output this shift: No intake/output data recorded. Filed Weights   01/17/21 2117  Weight: 121.6 kg    Examination: General exam: AAOx3 ,  NAD, weak appearing. HEENT:Oral mucosa moist, Ear/Nose WNL grossly,dentition normal. Respiratory system: bilaterally clear,no wheezing or crackles,no use of accessory muscle, non tender. Cardiovascular system: S1 & S2 +, regular, No JVD. Gastrointestinal system: Abdomen soft, NT,ND, BS+. Nervous System:Alert, awake, moving extremities and grossly nonfocal Extremities: No edema, distal peripheral pulses palpable.  Skin: No rashes,no icterus. MSK: Normal muscle bulk,tone, power   Data Reviewed: I have personally reviewed following labs and imaging studies CBC: Recent Labs  Lab 01/15/21 0754 01/17/21 1811 01/18/21 0709  WBC 2.7* 1.5* 2.0*  NEUTROABS 0.6* 0.7* PENDING  HGB 13.5 13.4 12.4*  HCT 40.5 40.0 37.0*  MCV 86.7 86.4 86.7  PLT 192 99* 81*   Basic Metabolic Panel: Recent Labs  Lab 01/15/21 0754 01/17/21 1811 01/18/21 0709  NA 136 135 134*  K 4.0 3.5 3.4*  CL 103 102 101  CO2 23 22 23   GLUCOSE 137* 116* 119*  BUN 12 13 11   CREATININE 0.82 0.95 0.80  CALCIUM 9.2 9.1 8.8*   GFR: Estimated Creatinine Clearance: 128.3 mL/min (by C-G formula based on SCr of 0.8 mg/dL). Liver Function Tests: Recent Labs  Lab 01/15/21 0754 01/17/21 1811  AST 43* 36  ALT 53* 48*  ALKPHOS 52 46  BILITOT 0.5 0.7  PROT 7.4 7.0  ALBUMIN 4.0 4.0   No results for input(s): LIPASE, AMYLASE in the last 168 hours. No results for input(s): AMMONIA in the last 168 hours. Coagulation Profile: Recent Labs  Lab 01/17/21 1811  INR 1.1   Cardiac Enzymes: No results for input(s): CKTOTAL, CKMB, CKMBINDEX, TROPONINI in the last 168 hours. BNP (last 3 results) No results for input(s):  PROBNP in the last 8760 hours. HbA1C: No results for input(s): HGBA1C in the last 72 hours. CBG: No results for input(s): GLUCAP in the last 168 hours. Lipid Profile: No results for input(s): CHOL, HDL, LDLCALC, TRIG, CHOLHDL, LDLDIRECT in the last 72 hours. Thyroid Function Tests: No results for input(s): TSH, T4TOTAL, FREET4, T3FREE, THYROIDAB in the last 72 hours. Anemia Panel: No results for input(s): VITAMINB12, FOLATE, FERRITIN, TIBC, IRON, RETICCTPCT in the last 72 hours. Sepsis Labs: Recent Labs  Lab 01/17/21 1811 01/17/21 2042 01/17/21 2304  LATICACIDVEN 2.1* 1.7 1.9    Recent Results (from the past 240 hour(s))  Resp Panel by RT-PCR (Flu A&B, Covid) Nasopharyngeal Swab     Status: None   Collection Time: 01/17/21  5:44 PM   Specimen: Nasopharyngeal Swab; Nasopharyngeal(NP) swabs in vial transport medium  Result Value Ref Range Status   SARS Coronavirus 2 by RT PCR NEGATIVE NEGATIVE Final    Comment: (NOTE) SARS-CoV-2 target nucleic acids are NOT DETECTED.  The SARS-CoV-2 RNA is generally detectable in upper respiratory specimens during the acute phase of infection. The lowest concentration of SARS-CoV-2 viral copies this assay can detect is 138 copies/mL. A negative result does not preclude SARS-Cov-2 infection and should not be used as the sole basis for treatment or other patient management decisions. A negative result may occur with  improper specimen collection/handling, submission of specimen other than nasopharyngeal swab, presence of viral mutation(s) within the areas targeted by this assay, and inadequate number of viral copies(<138 copies/mL). A negative result must be combined with clinical observations, patient history, and epidemiological information. The expected result is Negative.  Fact Sheet for Patients:  EntrepreneurPulse.com.au  Fact Sheet for Healthcare Providers:  IncredibleEmployment.be  This test is no  t yet approved or cleared by the [place] and  has been authorized for  detection and/or diagnosis of SARS-CoV-2 by FDA under an Emergency Use Authorization (EUA). This EUA will remain  in effect (meaning this test can be used) for the duration of the COVID-19 declaration under Section 564(b)(1) of the Act, 21 U.S.C.section 360bbb-3(b)(1), unless the authorization is terminated  or revoked sooner.       Influenza A by PCR NEGATIVE NEGATIVE Final   Influenza B by PCR NEGATIVE NEGATIVE Final    Comment: (NOTE) The Xpert Xpress SARS-CoV-2/FLU/RSV plus assay is intended as an aid in the diagnosis of influenza from Nasopharyngeal swab specimens and should not be used as a sole basis for treatment. Nasal washings and aspirates are unacceptable for Xpert Xpress SARS-CoV-2/FLU/RSV testing.  Fact Sheet for Patients: EntrepreneurPulse.com.au  Fact Sheet for Healthcare Providers: IncredibleEmployment.be  This test is not yet approved or cleared by the [place] FDA and has been authorized for detection and/or diagnosis of SARS-CoV-2 by FDA under an Emergency Use Authorization (EUA). This EUA will remain in effect (meaning this test can be used) for the duration of the COVID-19 declaration under Section 564(b)(1) of the Act, 21 U.S.C. section 360bbb-3(b)(1), unless the authorization is terminated or revoked.  Performed at [facility], [place] [address], [place], [place] [address]      Radiology Studies: DG Chest Port 1 View  Result Date: 01/17/2021 CLINICAL DATA:  Questionable sepsis EXAM: PORTABLE CHEST 1 VIEW COMPARISON:  09/06/2019 FINDINGS: The heart size and mediastinal contours are within normal limits. Right chest port catheter. Both lungs are clear. The visualized skeletal structures are unremarkable. IMPRESSION: No acute abnormality of the lungs in AP portable projection. Electronically Signed   By: Eddie Candle  M.D.   On: 01/17/2021 18:28     LOS: 0 days   Antonieta Pert, MD [facility]  01/18/2021, 7:47 AM

## 2021-01-18 NOTE — Consult Note (Signed)
[place]  [phone] Fax:(336) [place]  Reason for Consultation: Febrile neutropenia  HPI: [name] is a 60 year old male with a past medical history significant for Hodgkin's lymphoma, prostate cancer, hypertension, hyperlipidemia, and OSA.  He presented to the emergency room with a fever and chills.  Patient states that he had a fever up to 100.8 at home with shaking chills.  Work-up showed tachycardia and mild tachypnea but the patient was afebrile.  Labs showed a white count of 1.5 with an ANC of 0.7.  He was pancultured and started on antibiotics.  When seen today, patient reports that he is feeling better.  He is not having any recurrent fevers or chills.  He denies any symptoms of infection such as cough, shortness of breath, dysuria, abdominal pain, nausea, vomiting, diarrhea.  He has no other complaints such as chest pain or shortness of breath today.  The patient is currently receiving chemotherapy with AVD and received his last dose on 01/15/2021.  Medical oncology was asked see the patient make recommendations regarding his febrile neutropenia.   Past Medical History:  Diagnosis Date  . Cancer ([place]) 10/15   recent dx prostate ca-no tx yet  . GERD (gastroesophageal reflux disease)   . Hypertension   . Pneumonia    history  . Teeth decayed   :  Past Surgical History:  Procedure Laterality Date  . IR IMAGING GUIDED PORT INSERTION  12/11/2020  . MASS BIOPSY Left 09/29/2014   Procedure: LEFT LOWER NECK MASS EXCISION;  Surgeon: Izora Gala, MD;  Location: [facility];  Service: ENT;  Laterality: Left;  . PAROTIDECTOMY Left 09/29/2014   Procedure: LEFT PAROTIDECTOMY;  Surgeon: Izora Gala, MD;  Location: [facility];  Service: ENT;  Laterality: Left;  . SEPTOPLASTY  1976  :  Current Facility-Administered Medications  Medication Dose Route Frequency Provider Last Rate Last Admin  .  acetaminophen (TYLENOL) tablet 500 mg  500 mg Oral Q6H PRN Tu, Ching T, DO      . amLODipine (NORVASC) tablet 5 mg  5 mg Oral QPM Tu, Ching T, DO   5 mg at 01/17/21 2147  . ceFEPIme (MAXIPIME) 2 g in sodium chloride 0.9 % 100 mL IVPB  2 g Intravenous Q8H Wofford, Drew A, RPH 200 mL/hr at 01/18/21 0216 2 g at 01/18/21 0216  . Chlorhexidine Gluconate Cloth 2 % PADS 6 each  6 each Topical Daily Kc, Ramesh, MD      . enoxaparin (LOVENOX) injection 60 mg  60 mg Subcutaneous Q24H Tu, Ching T, DO   60 mg at 01/17/21 2148  . lactated ringers infusion   Intravenous Continuous Varney Biles, MD 150 mL/hr at 01/18/21 0504 New Bag at 01/18/21 0504  . losartan (COZAAR) tablet 100 mg  100 mg Oral QPM Tu, Ching T, DO   100 mg at 01/17/21 2147  . metroNIDAZOLE (FLAGYL) IVPB 500 mg  500 mg Intravenous Q8H Tu, Ching T, DO 100 mL/hr at 01/18/21 0505 500 mg at 01/18/21 0505  . ondansetron (ZOFRAN) tablet 8 mg  8 mg Oral Q8H PRN Tu, Ching T, DO      . pantoprazole (PROTONIX) EC tablet 40 mg  40 mg Oral QPM Tu, Ching T, DO   40 mg at 01/17/21 2147  . rosuvastatin (CRESTOR) tablet 5 mg  5 mg Oral QPM Tu, Ching T, DO   5 mg at 01/17/21 2147  . vancomycin (VANCOREADY) IVPB 1000  mg/200 mL  1,000 mg Intravenous Q12H Polly Cobia, RPH 200 mL/hr at 01/18/21 0506 1,000 mg at 01/18/21 0506     No Known Allergies:  Family History  Problem Relation Age of Onset  . Stroke Mother   . Heart disease Father   :  Social History   Socioeconomic History  . Marital status: Widowed    Spouse name: Not on file  . Number of children: Not on file  . Years of education: Not on file  . Highest education level: Not on file  Occupational History  . Not on file  Tobacco Use  . Smoking status: Former Smoker    Packs/day: 3.00    Years: 32.00    Pack years: 96.00    Types: Cigarettes    Quit date: 11/26/2017    Years since quitting: 3.1  . Smokeless tobacco: Never Used  Substance and Sexual Activity  . Alcohol use: Yes     Comment: twice a year  . Drug use: No  . Sexual activity: Not on file  Other Topics Concern  . Not on file  Social History Narrative  . Not on file   Social Determinants of Health   Financial Resource Strain: Not on file  Food Insecurity: Not on file  Transportation Needs: Not on file  Physical Activity: Not on file  Stress: Not on file  Social Connections: Not on file  Intimate Partner Violence: Not on file  :  Review of Systems: A comprehensive 14 point review of systems was negative except as noted in the HPI.  Exam: Patient Vitals for the past 24 hrs:  BP Temp Temp src Pulse Resp SpO2 Weight  01/18/21 0850 (!) 165/101 98 F (36.7 C) Oral 87 18 96 % --  01/18/21 0502 (!) 145/74 98.5 F (36.9 C) Oral 85 -- 95 % --  01/18/21 0114 (!) 153/76 98 F (36.7 C) Oral 84 -- 96 % --  01/17/21 2117 (!) 155/86 99.6 F (37.6 C) Oral 98 18 96 % 121.6 kg  01/17/21 2030 123/71 -- -- (!) 111 18 96 % --  01/17/21 2000 (!) 175/82 -- -- (!) 101 17 95 % --  01/17/21 1930 (!) 157/77 -- -- 97 20 97 % --  01/17/21 1900 (!) 166/74 -- -- 99 (!) 27 93 % --  01/17/21 1830 (!) 155/76 -- -- (!) 106 18 94 % --  01/17/21 1745 (!) 147/69 -- -- (!) 113 -- 93 % --  01/17/21 1735 (!) 159/78 98.5 F (36.9 C) Oral (!) 108 18 94 % --    General:  well-nourished in no acute distress.   Eyes:  no scleral icterus.   ENT:  There were no oropharyngeal lesions.     Respiratory: lungs were clear bilaterally without wheezing or crackles.   Cardiovascular:  Regular rate and rhythm, S1/S2, without murmur, rub or gallop.  There was no pedal edema.   GI:  abdomen was soft, flat, nontender, nondistended, without organomegaly.   Musculoskeletal:  no spinal tenderness of palpation of vertebral spine.   Skin exam was without echymosis, petichae.   Neuro exam was nonfocal. Patient was alert and oriented.  Attention was good.  Language was appropriate.  Mood was normal without depression.  Speech was not pressured.   Thought content was not tangential.     Lab Results  Component Value Date   WBC 2.0 (L) 01/18/2021   HGB 12.4 (L) 01/18/2021   HCT 37.0 (L) 01/18/2021   PLT  81 (L) 01/18/2021   GLUCOSE 119 (H) 01/18/2021   ALT 48 (H) 01/17/2021   AST 36 01/17/2021   NA 134 (L) 01/18/2021   K 3.4 (L) 01/18/2021   CL 101 01/18/2021   CREATININE 0.80 01/18/2021   BUN 11 01/18/2021   CO2 23 01/18/2021    DG Chest Port 1 View  Result Date: 01/17/2021 CLINICAL DATA:  Questionable sepsis EXAM: PORTABLE CHEST 1 VIEW COMPARISON:  09/06/2019 FINDINGS: The heart size and mediastinal contours are within normal limits. Right chest port catheter. Both lungs are clear. The visualized skeletal structures are unremarkable. IMPRESSION: No acute abnormality of the lungs in AP portable projection. Electronically Signed   By: Eddie Candle M.D.   On: 01/17/2021 18:28     DG Chest Port 1 View  Result Date: 01/17/2021 CLINICAL DATA:  Questionable sepsis EXAM: PORTABLE CHEST 1 VIEW COMPARISON:  09/06/2019 FINDINGS: The heart size and mediastinal contours are within normal limits. Right chest port catheter. Both lungs are clear. The visualized skeletal structures are unremarkable. IMPRESSION: No acute abnormality of the lungs in AP portable projection. Electronically Signed   By: Eddie Candle M.D.   On: 01/17/2021 18:28   Assessment and Plan:  [name] 60 y.o. male with medical history significant for Classical Hodgkin's lymphoma Early Stage/Unfavorable risk who presents for a follow up visit.  After review the labs, the records, review of the PFTs, and review the echocardiogram the findings are most consistent with an early stage/unfavorable risk classical Hodgkin's lymphoma.  Given that he meets unfavorable criteria via multiple guidelines I would recommend that we proceed with treatment as recommended in the NCCN guidelines for patients with early stage unfavorable disease (which is the exact same treatment recommended  for stage III-IV disease).  Previously we discussed the AVD chemotherapy regimen.  We discussed the expected side effects including possible hair loss, nausea, vomiting, diarrhea, constipation, cytopenias, neutropenia, cardiac dysfunction, and neurological damage.  We discussed that the patient is an early stage unfavorable risk disease meaning he will require 2 cycles of chemotherapy followed by interval PET CT scan to determine the rest of the course moving forward.  The patient voices understanding of the risks and benefits of this chemotherapy and was willing to proceed with treatment.  The treatment of choice for this patient will be a AVD chemotherapy for 2 cycles followed by a PET CT scan.  If response is noted on this PET CT scan we will proceed with a further 4 cycles of AVD  therapy.  The patient will be treated with every 2 week rounds of chemotherapy using this regimen. AVD chemotherapy consists of doxorubicin 25 mg/m on days 1 and 15, vinblastine 6 mg per metered squared IV on days 1 and 15, and dacarbazine 375 mg/m IV on days 1 and 15.  He developed fever and chills following his last cycle of chemotherapy.  He was found to be neutropenic and was admitted to the hospital for febrile neutropenia.  #Classical Hodgkin's Lymphoma, Mixed Cellularity. 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 There are some smaller lymph nodes in the neck and one near the hepatoduodenal ligament, though would favor diagnosis of early stage --based on these criteria the recommendation would be for AVD x 2 cycles with interval PET CT  scan followed by AVD x 4 cycles (assuming good response on interval PET).  --He received day 1 of cycle 2 of his chemotherapy on 01/15/2021.  Next  scheduled dose is planned for 01/29/2021.  --will avoid bleomycin in this patient due to smoking history, mild abnormalities on PFTs and age --Follow-up at the cancer center as previously scheduled.  #Febrile neutropenia --The patient developed fevers and chills at home.  He was found to be neutropenic. --Follow-up on cultures. --Continue broad-spectrum antibiotics. --From our standpoint, the patient may be discharged home once ANC is 1.0 or higher. --The use of GCSF with this chemotherapy is not recommended as these patients rarely get neutropenic fever despite low ANCs   Mikey Bussing, DNP, AGPCNP-BC, AOCNP

## 2021-01-19 DIAGNOSIS — D709 Neutropenia, unspecified: Secondary | ICD-10-CM | POA: Diagnosis not present

## 2021-01-19 DIAGNOSIS — R5081 Fever presenting with conditions classified elsewhere: Secondary | ICD-10-CM | POA: Diagnosis not present

## 2021-01-19 LAB — DIFFERENTIAL
Abs Immature Granulocytes: 0.01 10*3/uL (ref 0.00–0.07)
Basophils Absolute: 0 10*3/uL (ref 0.0–0.1)
Basophils Relative: 1 %
Eosinophils Absolute: 0.1 10*3/uL (ref 0.0–0.5)
Eosinophils Relative: 4 %
Immature Granulocytes: 0 %
Lymphocytes Relative: 24 %
Lymphs Abs: 0.7 10*3/uL (ref 0.7–4.0)
Monocytes Absolute: 0.3 10*3/uL (ref 0.1–1.0)
Monocytes Relative: 9 %
Neutro Abs: 1.8 10*3/uL (ref 1.7–7.7)
Neutrophils Relative %: 62 %

## 2021-01-19 LAB — BASIC METABOLIC PANEL
Anion gap: 8 (ref 5–15)
BUN: 12 mg/dL (ref 6–20)
CO2: 26 mmol/L (ref 22–32)
Calcium: 9.3 mg/dL (ref 8.9–10.3)
Chloride: 100 mmol/L (ref 98–111)
Creatinine, Ser: 0.78 mg/dL (ref 0.61–1.24)
GFR, Estimated: >60 mL/min (ref >60)
Glucose, Bld: 132 mg/dL — ABNORMAL HIGH (ref 70–99)
Potassium: 4 mmol/L (ref 3.5–5.1)
Sodium: 134 mmol/L — ABNORMAL LOW (ref 135–145)

## 2021-01-19 LAB — URINE CULTURE: Culture: NO GROWTH

## 2021-01-19 LAB — CBC
HCT: 39.1 % (ref 39.0–52.0)
Hemoglobin: 13.6 g/dL (ref 13.0–17.0)
MCH: 29.4 pg (ref 26.0–34.0)
MCHC: 34.8 g/dL (ref 30.0–36.0)
MCV: 84.6 fL (ref 80.0–100.0)
Platelets: 109 10*3/uL — ABNORMAL LOW (ref 150–400)
RBC: 4.62 MIL/uL (ref 4.22–5.81)
RDW: 13.5 % (ref 11.5–15.5)
WBC: 2.8 10*3/uL — ABNORMAL LOW (ref 4.0–10.5)
nRBC: 0 % (ref 0.0–0.2)

## 2021-01-19 MED ORDER — HEPARIN SOD (PORK) LOCK FLUSH 100 UNIT/ML IV SOLN
500.0000 [IU] | INTRAVENOUS | Status: AC | PRN
  Administered 2021-01-19: 500 [IU]
  Filled 2021-01-19: qty 5

## 2021-01-19 NOTE — Discharge Summary (Signed)
Physician Discharge Summary  [name] QIH:[id number] DOB: [birth year] DOA: 01/17/2021  PCP: London Pepper, MD  Admit date: 01/17/2021 Discharge date: 01/19/2021  Admitted From: home Disposition:  home  Recommendations for Outpatient Follow-up:  1. Follow up with PCP in 1-2 weeks Please obtain BMP/CBC in one week  Home Health:no  Equipment/Devices: none  Discharge Condition: Stable Code Status:   Code Status: Full Code Diet recommendation:  Diet Order            Diet - low sodium heart healthy           DIET SOFT Room service appropriate? Yes; Fluid consistency: Thin  Diet effective now                  Brief/Interim Summary:  53m w/ Hodgkin's lymphoma currently on chemo, prostate cancer, hypertension, hyperlipidemia, OSA presents with fever.  He had his last chemo on 2/11.  He woke up feeling unwell and later in the afternoon began to have chills and rigor headache but no neck pain stiffness. In the ED afebrile, tachycardic, mildly tachypneic, saturating well on room air, blood work showed leukopenia 1500 with ANC 0.7.  Patient was placed on broad-spectrum antibiotics and admitted. No fever recurrence, culture blood no growth to day. Discussed w/ Dr Eliberto Ivory and okay to d/c hyome no antibiotic needed.  Discharge Diagnoses:   Neutropenic fever/SIRS W/ leukopenia, fever tachycardia:Chest x-ray no pneumonia UA fairly stable.  Blood culture and culture NGTD- ANC improved to 1800.No fever recurrence, culture blood no growth to day. Discussed w/ Dr Eliberto Ivory and okay to d/c hyome no antibiotic needed. 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 Thrombocytopenia: Platelet improved to 109k.Suspect due to chemotherapy. Recent Labs  Lab 01/17/21 1811 01/18/21 0709 01/19/21 0506  HGB 13.4 12.4* 13.6  HCT 40.0 37.0* 39.1  WBC 1.5* 2.0* 2.8*  PLT 99* 81* 109*  Hypokalemia/hyponatremia  stable Hypercholesterolemia: Continue patient's  statin. HTN: Blood pressure stable on home amlodipine and losartan. Morbid obesity with BMI 38.4/OSA refused CPAP here, uses at home. PCP follow-up for weight loss  Consults:  Hem onc  Subjective: Aaox3, no complaints, no fever, feels ready for home. "Dr Lorenso Courier told me if by counTs are  Above 1000" I can go home- his [place] is 1800 this am  Discharge Exam: Vitals:   01/18/21 2109 01/19/21 0504  BP: (!) 126/92 (!) 146/89  Pulse: 81 100  Resp:  16  Temp: 98.6 F (37 C) 98 F (36.7 C)  SpO2: 96% 98%   General: Pt is alert, awake, not in acute distress Cardiovascular: RRR, S1/S2 +, no rubs, no gallops Respiratory: CTA bilaterally, no wheezing, no rhonchi Abdominal: Soft, NT, ND, bowel sounds + Extremities: no edema, no cyanosis  Discharge Instructions  Discharge Instructions    Diet - low sodium heart healthy   Complete by: As directed    Discharge instructions   Complete by: As directed    Please call call MD or return to ER for similar or worsening recurring problem that brought you to hospital or if any fever,nausea/vomiting,abdominal pain, uncontrolled pain, chest pain,  shortness of breath or any other alarming symptoms.  Please follow-up your doctor as instructed in a week time and call the office for appointment.  Please avoid alcohol, smoking, or any other illicit substance and maintain healthy habits including taking your regular medications as prescribed.  You were cared for by a hospitalist during your hospital stay. If you have any questions  about your discharge medications or the care you received while you were in the hospital after you are discharged, you can call the unit and ask to speak with the hospitalist on call if the hospitalist that took care of you is not available.  Once you are discharged, your primary care physician will handle any further medical issues. Please note that NO REFILLS for any discharge medications will be authorized once you are  discharged, as it is imperative that you return to your primary care physician (or establish a relationship with a primary care physician if you do not have one) for your aftercare needs so that they can reassess your need for medications and monitor your lab values   Increase activity slowly   Complete by: As directed      Allergies as of 01/19/2021   No Known Allergies     Medication List    TAKE these medications   acetaminophen 500 MG tablet Commonly known as: TYLENOL Take 500 mg by mouth every 6 (six) hours as needed for headache.   amLODipine 5 MG tablet Commonly known as: NORVASC Take 5 mg by mouth every evening.   B Complex Formula 1 (w/ FA) Tabs Take 1 tablet by mouth daily.   lidocaine-prilocaine cream Commonly known as: EMLA Apply 1 application topically as needed. What changed: reasons to take this   losartan 100 MG tablet Commonly known as: COZAAR Take 100 mg by mouth every evening.   metFORMIN 500 MG tablet Commonly known as: GLUCOPHAGE Take 1 tablet by mouth 2 (two) times daily.   ondansetron 8 MG tablet Commonly known as: ZOFRAN Take 1 tablet (8 mg total) by mouth every 8 (eight) hours as needed for nausea or vomiting.   pantoprazole 40 MG tablet Commonly known as: PROTONIX Take 1 tablet by mouth every evening.   prochlorperazine 10 MG tablet Commonly known as: COMPAZINE Take 1 tablet (10 mg total) by mouth every 6 (six) hours as needed for nausea or vomiting.   rosuvastatin 5 MG tablet Commonly known as: CRESTOR Take 5 mg by mouth every evening.       Follow-up Information    London Pepper, MD Follow up in 1 week(s).   Specialty: Family Medicine Contact information: [place] [address] [phone]        Orson Slick, MD Follow up.   Specialty: Hematology and Oncology Contact information: [place] [address] [phone]              No Known Allergies  The  results of significant diagnostics from this hospitalization (including imaging, microbiology, ancillary and laboratory) are listed below for reference.    Microbiology: Recent Results (from the past 240 hour(s))  Resp Panel by RT-PCR (Flu A&B, Covid) Nasopharyngeal Swab     Status: None   Collection Time: 01/17/21  5:44 PM   Specimen: Nasopharyngeal Swab; Nasopharyngeal(NP) swabs in vial transport medium  Result Value Ref Range Status   SARS Coronavirus 2 by RT PCR NEGATIVE NEGATIVE Final    Comment: (NOTE) SARS-CoV-2 target nucleic acids are NOT DETECTED.  The SARS-CoV-2 RNA is generally detectable in upper respiratory specimens during the acute phase of infection. The lowest concentration of SARS-CoV-2 viral copies this assay can detect is 138 copies/mL. A negative result does not preclude SARS-Cov-2 infection and should not be used as the sole basis for treatment or other patient management decisions. A negative result may occur with  improper specimen collection/handling, submission of specimen other than nasopharyngeal swab, presence of viral mutation(s) within the areas targeted by this assay, and inadequate number of viral copies(<138 copies/mL). A negative result must be combined with clinical observations, patient history, and epidemiological information. The expected result is Negative.  Fact Sheet for Patients:  EntrepreneurPulse.com.au  Fact Sheet for Healthcare Providers:  IncredibleEmployment.be  This test is no t yet approved or cleared by the [place] FDA and  has been authorized for detection and/or diagnosis of SARS-CoV-2 by FDA under an Emergency Use Authorization (EUA). This EUA will remain  in effect (meaning this test can be used) for the duration of the COVID-19 declaration under Section 564(b)(1) of the Act, 21 U.S.C.section 360bbb-3(b)(1), unless the authorization is terminated  or revoked sooner.        Influenza A by PCR NEGATIVE NEGATIVE Final   Influenza B by PCR NEGATIVE NEGATIVE Final    Comment: (NOTE) The Xpert Xpress SARS-CoV-2/FLU/RSV plus assay is intended as an aid in the diagnosis of influenza from Nasopharyngeal swab specimens and should not be used as a sole basis for treatment. Nasal washings and aspirates are unacceptable for Xpert Xpress SARS-CoV-2/FLU/RSV testing.  Fact Sheet for Patients: EntrepreneurPulse.com.au  Fact Sheet for Healthcare Providers: IncredibleEmployment.be  This test is not yet approved or cleared by the [place] FDA and has been authorized for detection and/or diagnosis of SARS-CoV-2 by FDA under an Emergency Use Authorization (EUA). This EUA will remain in effect (meaning this test can be used) for the duration of the COVID-19 declaration under Section 564(b)(1) of the Act, 21 U.S.C. section 360bbb-3(b)(1), unless the authorization is terminated or revoked.  Performed at [facility], [place] [address], [place], [place] [address]   Urine culture     Status: None   Collection Time: 01/17/21  6:00 PM   Specimen: Urine, Catheterized  Result Value Ref Range Status   Specimen Description   Final    URINE, CATHETERIZED Performed at [facility] [address], [place], [place] [address]    Special Requests   Final    NONE Performed at [facility], [place] [address], [place], [place] [address]    Culture   Final    NO GROWTH Performed at [facility], [place] [address], [place], [place] [address]    Report Status 01/19/2021 FINAL  Final  Blood Culture (routine x 2)     Status: None (Preliminary result)   Collection Time: 01/17/21  6:11 PM   Specimen: BLOOD  Result Value Ref Range Status   Specimen Description   Final    BLOOD LEFT ANTECUBITAL Performed at [facility] [address] [place], [place] [address]     Special Requests   Final    BOTTLES DRAWN AEROBIC AND ANAEROBIC Blood Culture adequate volume Performed at [facility] [address] [place], [place] [address]    Culture   Final    NO GROWTH < 24 HOURS Performed at [facility] [address] [place], [place] [address]    Report Status PENDING  Incomplete  Blood Culture (routine x 2)     Status: None (Preliminary result)   Collection Time: 01/17/21  6:26 PM   Specimen: BLOOD  Result Value Ref Range Status   Specimen Description   Final    BLOOD RIGHT ANTECUBITAL Performed at [facility] [address] [place], [place] [address]  Special Requests   Final    BOTTLES DRAWN AEROBIC AND ANAEROBIC Blood Culture adequate volume Performed at [facility] [address] [place], [place] [address]    Culture   Final    NO GROWTH < 24 HOURS Performed at [facility] [address] [place], [place] [address]    Report Status PENDING  Incomplete    Procedures/Studies: DG Chest Port 1 View  Result Date: 01/17/2021 CLINICAL DATA:  Questionable sepsis EXAM: PORTABLE CHEST 1 VIEW COMPARISON:  09/06/2019 FINDINGS: The heart size and mediastinal contours are within normal limits. Right chest port catheter. Both lungs are clear. The visualized skeletal structures are unremarkable. IMPRESSION: No acute abnormality of the lungs in AP portable projection. Electronically Signed   By: Eddie Candle M.D.   On: 01/17/2021 18:28    Labs: BNP (last 3 results) No results for input(s): BNP in the last 8760 hours. Basic Metabolic Panel: Recent Labs  Lab 01/15/21 0754 01/17/21 1811 01/18/21 0709 01/19/21 0506  NA 136 135 134* 134*  K 4.0 3.5 3.4* 4.0  CL 103 102 101 100  CO2 23 22 23 26   GLUCOSE 137* 116* 119* 132*  BUN 12 13 11 12   CREATININE 0.82 0.95 0.80 0.78  CALCIUM 9.2 9.1 8.8* 9.3   Liver Function Tests: Recent Labs  Lab 01/15/21 0754 01/17/21 1811   AST 43* 36  ALT 53* 48*  ALKPHOS 52 46  BILITOT 0.5 0.7  PROT 7.4 7.0  ALBUMIN 4.0 4.0   No results for input(s): LIPASE, AMYLASE in the last 168 hours. No results for input(s): AMMONIA in the last 168 hours. CBC: Recent Labs  Lab 01/15/21 0754 01/17/21 1811 01/18/21 0709 01/19/21 0506  WBC 2.7* 1.5* 2.0* 2.8*  NEUTROABS 0.6* 0.7* 0.9* 1.8  HGB 13.5 13.4 12.4* 13.6  HCT 40.5 40.0 37.0* 39.1  MCV 86.7 86.4 86.7 84.6  PLT 192 99* 81* 109*   Cardiac Enzymes: No results for input(s): CKTOTAL, CKMB, CKMBINDEX, TROPONINI in the last 168 hours. BNP: Invalid input(s): POCBNP CBG: No results for input(s): GLUCAP in the last 168 hours. D-Dimer No results for input(s): DDIMER in the last 72 hours. Hgb A1c No results for input(s): HGBA1C in the last 72 hours. Lipid Profile No results for input(s): CHOL, HDL, LDLCALC, TRIG, CHOLHDL, LDLDIRECT in the last 72 hours. Thyroid function studies No results for input(s): TSH, T4TOTAL, T3FREE, THYROIDAB in the last 72 hours.  Invalid input(s): FREET3 Anemia work up No results for input(s): VITAMINB12, FOLATE, FERRITIN, TIBC, IRON, RETICCTPCT in the last 72 hours. Urinalysis    Component Value Date/Time   COLORURINE YELLOW 01/17/2021 1800   APPEARANCEUR CLEAR 01/17/2021 1800   LABSPEC 1.016 01/17/2021 1800   PHURINE 5.0 01/17/2021 1800   GLUCOSEU NEGATIVE 01/17/2021 1800   HGBUR NEGATIVE 01/17/2021 1800   BILIRUBINUR NEGATIVE 01/17/2021 1800   KETONESUR NEGATIVE 01/17/2021 1800   PROTEINUR NEGATIVE 01/17/2021 1800   UROBILINOGEN 0.2 09/05/2008 1845   NITRITE NEGATIVE 01/17/2021 1800   LEUKOCYTESUR NEGATIVE 01/17/2021 1800   Sepsis Labs Invalid input(s): PROCALCITONIN,  WBC,  LACTICIDVEN Microbiology Recent Results (from the past 240 hour(s))  Resp Panel by RT-PCR (Flu A&B, Covid) Nasopharyngeal Swab     Status: None   Collection Time: 01/17/21  5:44 PM   Specimen: Nasopharyngeal Swab; Nasopharyngeal(NP) swabs in vial  transport medium  Result Value Ref Range Status   SARS Coronavirus 2 by RT PCR NEGATIVE NEGATIVE Final    Comment: (NOTE) SARS-CoV-2  target nucleic acids are NOT DETECTED.  The SARS-CoV-2 RNA is generally detectable in upper respiratory specimens during the acute phase of infection. The lowest concentration of SARS-CoV-2 viral copies this assay can detect is 138 copies/mL. A negative result does not preclude SARS-Cov-2 infection and should not be used as the sole basis for treatment or other patient management decisions. A negative result may occur with  improper specimen collection/handling, submission of specimen other than nasopharyngeal swab, presence of viral mutation(s) within the areas targeted by this assay, and inadequate number of viral copies(<138 copies/mL). A negative result must be combined with clinical observations, patient history, and epidemiological information. The expected result is Negative.  Fact Sheet for Patients:  EntrepreneurPulse.com.au  Fact Sheet for Healthcare Providers:  IncredibleEmployment.be  This test is no t yet approved or cleared by the [place] FDA and  has been authorized for detection and/or diagnosis of SARS-CoV-2 by FDA under an Emergency Use Authorization (EUA). This EUA will remain  in effect (meaning this test can be used) for the duration of the COVID-19 declaration under Section 564(b)(1) of the Act, 21 U.S.C.section 360bbb-3(b)(1), unless the authorization is terminated  or revoked sooner.       Influenza A by PCR NEGATIVE NEGATIVE Final   Influenza B by PCR NEGATIVE NEGATIVE Final    Comment: (NOTE) The Xpert Xpress SARS-CoV-2/FLU/RSV plus assay is intended as an aid in the diagnosis of influenza from Nasopharyngeal swab specimens and should not be used as a sole basis for treatment. Nasal washings and aspirates are unacceptable for Xpert Xpress SARS-CoV-2/FLU/RSV testing.  Fact  Sheet for Patients: EntrepreneurPulse.com.au  Fact Sheet for Healthcare Providers: IncredibleEmployment.be  This test is not yet approved or cleared by the [place] FDA and has been authorized for detection and/or diagnosis of SARS-CoV-2 by FDA under an Emergency Use Authorization (EUA). This EUA will remain in effect (meaning this test can be used) for the duration of the COVID-19 declaration under Section 564(b)(1) of the Act, 21 U.S.C. section 360bbb-3(b)(1), unless the authorization is terminated or revoked.  Performed at [facility], [place] [address], [place], [place] [address]   Urine culture     Status: None   Collection Time: 01/17/21  6:00 PM   Specimen: Urine, Catheterized  Result Value Ref Range Status   Specimen Description   Final    URINE, CATHETERIZED Performed at [place] [address], [place], [place] [address]    Special Requests   Final    NONE Performed at [facility], [place] [address], [place], [place] [address]    Culture   Final    NO GROWTH Performed at [facility], [place] [address] [place], [place] [address]    Report Status 01/19/2021 FINAL  Final  Blood Culture (routine x 2)     Status: None (Preliminary result)   Collection Time: 01/17/21  6:11 PM   Specimen: BLOOD  Result Value Ref Range Status   Specimen Description   Final    BLOOD LEFT ANTECUBITAL Performed at [facility] [address], [place], [place] [address]    Special Requests   Final    BOTTLES DRAWN AEROBIC AND ANAEROBIC Blood Culture adequate volume Performed at [facility] [address], [place], [place] [address]    Culture   Final    NO GROWTH < 24 HOURS Performed at [facility] [address], [place], [place] [address]  Report Status PENDING  Incomplete  Blood Culture (routine x 2)     Status: None (Preliminary  result)   Collection Time: 01/17/21  6:26 PM   Specimen: BLOOD  Result Value Ref Range Status   Specimen Description   Final    BLOOD RIGHT ANTECUBITAL Performed at [facility] [address], [place], [place] [address]    Special Requests   Final    BOTTLES DRAWN AEROBIC AND ANAEROBIC Blood Culture adequate volume Performed at [facility] [address] [place], [place] [address]    Culture   Final    NO GROWTH < 24 HOURS Performed at [facility] [address], [place], [place] [address]    Report Status PENDING  Incomplete     Time coordinating discharge: 25 minutes  SIGNED: Antonieta Pert, MD  [facility] 01/19/2021, 9:32 AM  If 7PM-7AM, please contact night-coverage [email or web address]

## 2021-01-22 LAB — CULTURE, BLOOD (ROUTINE X 2)
Culture: NO GROWTH
Culture: NO GROWTH
Special Requests: ADEQUATE
Special Requests: ADEQUATE

## 2021-01-28 NOTE — Progress Notes (Signed)
[facility] [phone]   [phone]  PROGRESS NOTE  Patient Care Team: London Pepper, MD as PCP - General (Family Medicine)  Hematological/Oncological History # Classical Hodgkin's Lymphoma, Mixed Cellularity. 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 2) 10/27/2020: resection of neck mass. Pathology showed classical Hodgkin's lymphoma, mixed cellularity subtype 3) 11/13/2020: establish care with Dr. Lorenso Courier  4) 11/26/2020: PET CT scan shows single hypermetabolic unenlarged right level II cervical lymph node identified on today's study. FDG uptake is compatible with [place] 4. Other small bilateral cervical and upper normal to borderline hepatoduodenal ligament lymph nodes show [place] 2 uptake levels 5) 12/18/2020: Cycle 1 Day 1 of AVD chemotherapy  6) 01/15/2021: Cycle 2 Day 1 of AVD chemotherapy   Interval History:  [name] 60 y.o. male with medical history significant for Classical Hodgkin's lymphoma Early Stage/Unfavorable risk who presents for a follow up visit. The patient's last visit was on 01/15/2021. In the interim since the last visit he was admitted for neutropenic fever from 2/13-2/15/2022. [place] rose to >1.0 and abx were discontinued. No clear etiology was found.   On exam today [name] reports he has been well since his d/c from the hospital. He has had no further fevers/chills/sweats or infectious symptoms. He has had a good appetite and is gaining weight after losing some during his hospitalization.  He reports he has had no other major side effects as result of his chemotherapy.  He has not been requiring his nausea medications.  He is optimistic about his upcoming PET CT scan.  He otherwise had no additional questions comments or concerns.  A full 10 point ROS is listed below.  MEDICAL HISTORY:  Past Medical History:  Diagnosis Date  . Cancer  ([place]) 10/15   recent dx prostate ca-no tx yet  . GERD (gastroesophageal reflux disease)   . Hypertension   . Pneumonia    history  . Teeth decayed     SURGICAL HISTORY: Past Surgical History:  Procedure Laterality Date  . IR IMAGING GUIDED PORT INSERTION  12/11/2020  . MASS BIOPSY Left 09/29/2014   Procedure: LEFT LOWER NECK MASS EXCISION;  Surgeon: Izora Gala, MD;  Location: [facility];  Service: ENT;  Laterality: Left;  . PAROTIDECTOMY Left 09/29/2014   Procedure: LEFT PAROTIDECTOMY;  Surgeon: Izora Gala, MD;  Location: [facility];  Service: ENT;  Laterality: Left;  . SEPTOPLASTY  1976    SOCIAL HISTORY: Social History   Socioeconomic History  . Marital status: Widowed    Spouse name: Not on file  . Number of children: Not on file  . Years of education: Not on file  . Highest education level: Not on file  Occupational History  . Not on file  Tobacco Use  . Smoking status: Former Smoker    Packs/day: 3.00    Years: 32.00    Pack years: 96.00    Types: Cigarettes    Quit date: 11/26/2017    Years since quitting: 3.1  . Smokeless tobacco: Never Used  Substance and Sexual Activity  . Alcohol use: Yes    Comment: twice a year  . Drug use: No  . Sexual activity: Not on file  Other Topics Concern  . Not on file  Social History Narrative  . Not on file   Social Determinants of Health   Financial Resource Strain: Not on file  Food Insecurity:  Not on file  Transportation Needs: Not on file  Physical Activity: Not on file  Stress: Not on file  Social Connections: Not on file  Intimate Partner Violence: Not on file    FAMILY HISTORY: Family History  Problem Relation Age of Onset  . Stroke Mother   . Heart disease Father     ALLERGIES:  has No Known Allergies.  MEDICATIONS:  Current Outpatient Medications  Medication Sig Dispense Refill  . acetaminophen (TYLENOL) 500 MG tablet Take 500 mg by mouth every 6 (six) hours as  needed for headache.    Marland Kitchen amLODipine (NORVASC) 5 MG tablet Take 5 mg by mouth every evening.    . B Complex-Folic Acid (B COMPLEX FORMULA 1, W/ FA,) TABS Take 1 tablet by mouth daily. 30 tablet 3  . lidocaine-prilocaine (EMLA) cream Apply 1 application topically as needed. (Patient taking differently: Apply 1 application topically as needed (access port).) 30 g 0  . losartan (COZAAR) 100 MG tablet Take 100 mg by mouth every evening.    . metFORMIN (GLUCOPHAGE) 500 MG tablet Take 1 tablet by mouth 2 (two) times daily.    . ondansetron (ZOFRAN) 8 MG tablet Take 1 tablet (8 mg total) by mouth every 8 (eight) hours as needed for nausea or vomiting. 39 tablet 0  . pantoprazole (PROTONIX) 40 MG tablet Take 1 tablet by mouth every evening.    . prochlorperazine (COMPAZINE) 10 MG tablet Take 1 tablet (10 mg total) by mouth every 6 (six) hours as needed for nausea or vomiting. 30 tablet 0  . rosuvastatin (CRESTOR) 5 MG tablet Take 5 mg by mouth every evening.     No current facility-administered medications for this visit.   Facility-Administered Medications Ordered in Other Visits  Medication Dose Route Frequency Provider Last Rate Last Admin  . 0.9 %  sodium chloride infusion   Intravenous Once PRN Narda Rutherford T IV, MD      . albuterol (PROVENTIL) (2.5 MG/3ML) 0.083% nebulizer solution 2.5 mg  2.5 mg Nebulization Once PRN Ledell Peoples IV, MD      . alteplase (CATHFLO ACTIVASE) injection 2 mg  2 mg Intracatheter Once PRN Orson Slick, MD      . Cold Pack 1 packet  1 packet Topical Once PRN Ledell Peoples IV, MD      . dacarbazine (DTIC) 920 mg in sodium chloride 0.9 % 250 mL chemo infusion  375 mg/m2 (Treatment Plan Recorded) Intravenous Once Orson Slick, MD      . diphenhydrAMINE (BENADRYL) injection 50 mg  50 mg Intravenous Once PRN Ledell Peoples IV, MD      . DOXOrubicin (ADRIAMYCIN) chemo injection 62 mg  25 mg/m2 (Treatment Plan Recorded) Intravenous Once Ledell Peoples IV, MD       . famotidine (PEPCID) IVPB 20 mg premix  20 mg Intravenous Once PRN Narda Rutherford T IV, MD      . heparin lock flush 100 unit/mL  500 Units Intracatheter Once PRN Narda Rutherford T IV, MD      . heparin lock flush 100 unit/mL  250 Units Intracatheter Once PRN Orson Slick, MD      . Hot Pack 1 packet  1 packet Topical Once PRN Ledell Peoples IV, MD      . methylPREDNISolone sodium succinate (SOLU-MEDROL) 125 mg/2 mL injection 125 mg  125 mg Intravenous Once PRN Orson Slick, MD      .  sodium chloride flush (NS) 0.9 % injection 10 mL  10 mL Intracatheter PRN Ledell Peoples IV, MD      . sodium chloride flush (NS) 0.9 % injection 3 mL  3 mL Intracatheter PRN Ledell Peoples IV, MD      . vinBLAStine (VELBAN) 14.6 mg in sodium chloride 0.9 % 50 mL chemo infusion  6 mg/m2 (Treatment Plan Recorded) Intravenous Once Orson Slick, MD        REVIEW OF SYSTEMS:   Constitutional: ( - ) fevers, ( - )  chills , ( - ) night sweats Eyes: ( - ) blurriness of vision, ( - ) double vision, ( - ) watery eyes Ears, nose, mouth, throat, and face: ( - ) mucositis, ( - ) sore throat Respiratory: ( - ) cough, ( - ) dyspnea, ( - ) wheezes Cardiovascular: ( - ) palpitation, ( - ) chest discomfort, ( - ) lower extremity swelling Gastrointestinal:  ( - ) nausea, ( - ) heartburn, ( - ) change in bowel habits Skin: ( - ) abnormal skin rashes Lymphatics: ( - ) new lymphadenopathy, ( - ) easy bruising Neurological: ( - ) numbness, ( - ) tingling, ( - ) new weaknesses Behavioral/Psych: ( - ) mood change, ( - ) new changes  All other systems were reviewed with the patient and are negative.  PHYSICAL EXAMINATION: ECOG PERFORMANCE STATUS: 1 - Symptomatic but completely ambulatory  Vitals:   01/29/21 0800  BP: 129/80  Pulse: 94  Resp: 17  Temp: 97.7 F (36.5 C)  SpO2: 97%   Filed Weights   01/29/21 0800  Weight: 259 lb 12.8 oz (117.8 kg)    GENERAL: well appearing middle aged Caucasian male in NAD   SKIN: skin color, texture, turgor are normal, no rashes or significant lesions EYES: conjunctiva are pink and non-injected, sclera clear NECK: supple, non-tender LYMPH:  Prior surgical scars on right neck, some palpable tissue (unclear if residual node or scar tissue) no palpable lymphadenopathy in the cervical, axillary or supraclavicular lymph nodes.  LUNGS: clear to auscultation and percussion with normal breathing effort HEART: regular rate & rhythm and no murmurs and no lower extremity edema Musculoskeletal: no cyanosis of digits and no clubbing  PSYCH: alert & oriented x 3, fluent speech NEURO: no focal motor/sensory deficits  LABORATORY DATA:  I have reviewed the data as listed CBC Latest Ref Rng & Units 01/29/2021 01/19/2021 01/18/2021  WBC 4.0 - 10.5 K/uL 3.0(L) 2.8(L) 2.0(L)  Hemoglobin 13.0 - 17.0 g/dL 13.8 13.6 12.4(L)  Hematocrit 39.0 - 52.0 % 40.7 39.1 37.0(L)  Platelets 150 - 400 K/uL 228 109(L) 81(L)    CMP Latest Ref Rng & Units 01/29/2021 01/19/2021 01/18/2021  Glucose 70 - 99 mg/dL 133(H) 132(H) 119(H)  BUN 6 - 20 mg/dL _0 Creatinine 0.61 - 1.24 mg/dL 0.90 0.78 0.80  Sodium 135 - 145 mmol/L 136 134(L) 134(L)  Potassium 3.5 - 5.1 mmol/L 3.9 4.0 3.4(L)  Chloride 98 - 111 mmol/L 104 100 101  CO2 22 - 32 mmol/L _1 Calcium 8.9 - 10.3 mg/dL 9.3 9.3 8.8(L)  Total Protein 6.5 - 8.1 g/dL 7.4 - -  Total Bilirubin 0.3 - 1.2 mg/dL 0.3 - -  Alkaline Phos 38 - 126 U/L 56 - -  AST 15 - 41 U/L 48(H) - -  ALT 0 - 44 U/L 64(H) - -    No results found for: MPROTEIN No results found  for: KPAFRELGTCHN, LAMBDASER, KAPLAMBRATIO   RADIOGRAPHIC STUDIES:  DG Chest Port 1 View  Result Date: 01/17/2021 CLINICAL DATA:  Questionable sepsis EXAM: PORTABLE CHEST 1 VIEW COMPARISON:  09/06/2019 FINDINGS: The heart size and mediastinal contours are within normal limits. Right chest port catheter. Both lungs are clear. The visualized skeletal structures are unremarkable.  IMPRESSION: No acute abnormality of the lungs in AP portable projection. Electronically Signed   By: Eddie Candle M.D.   On: 01/17/2021 18:28    ASSESSMENT & PLAN [name] 60 y.o. male with medical history significant for Classical Hodgkin's lymphoma Early Stage/Unfavorable risk who presents for a follow up visit.  After review the labs, the records, review of the PFTs, and review the echocardiogram the findings are most consistent with an early stage/unfavorable risk classical Hodgkin's lymphoma.  Given that he meets unfavorable criteria via multiple guidelines I would recommend that we proceed with treatment as recommended in the NCCN guidelines for patients with early stage unfavorable disease (which is the exact same treatment recommended for stage III-IV disease).  Previously we discussed the AVD chemotherapy regimen.  We discussed the expected side effects including possible hair loss, nausea, vomiting, diarrhea, constipation, cytopenias, neutropenia, cardiac dysfunction, and neurological damage.  We discussed that the patient is an early stage unfavorable risk disease meaning he will require 2 cycles of chemotherapy followed by interval PET CT scan to determine the rest of the course moving forward.  The patient voices understanding of the risks and benefits of this chemotherapy and was willing to proceed with treatment.  The treatment of choice for this patient will be a AVD chemotherapy for 2 cycles followed by a PET CT scan.  If response is noted on this PET CT scan we will proceed with a further 4 cycles of AVD  therapy.  The patient will be treated with every 2 week rounds of chemotherapy using this regimen. AVD chemotherapy consists of doxorubicin 25 mg/m on days 1 and 15, vinblastine 6 mg per metered squared IV on days 1 and 15, and dacarbazine 375 mg/m IV on days 1 and 15.  # Classical Hodgkin's Lymphoma, Mixed Cellularity. 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 There are some smaller lymph nodes in the neck and one near the hepatoduodenal ligament, though would favor diagnosis of early stage --based on these criteria the recommendation would be for AVD x 2 cycles with interval PET CT scan followed by AVD x 4 cycles (assuming good response on interval PET).  --today is Cycle 2 Day 15 of AVD chemotherapy.  --will avoid bleomycin in this patient due to smoking history, mild abnormalities on PFTs and age --RTC for Cycle 3 Day 1 of treatment in 2 weeks with interval PET CT scan.   #Neutropenia, Severe --expected with this chemotherapy regimen. The use of GCSF with this chemotherapy is not routinely recommended as these patients rarely get neutropenic fever despite low ANCs --however, this patient had an episode of febrile neutropenia. Will pursue GCSF therapy following his treatment.   #Supportive Care --chemotherapy education to be scheduled  --zofran 18m q8H PRN and compazine 162mPO q6H for nausea -- EMLA cream for port -- no pain medication required at this time.   No orders of the defined types were placed in this  encounter.  All questions were answered. The patient knows to call the clinic with any problems, questions or concerns.  A total of more than 30 minutes were spent on this encounter and over half of that time was spent on counseling and coordination of care as outlined above.   Ledell Peoples, MD Department of Hematology/Oncology [place] [facility] Phone: [phone] Pager: [phone] Email: [email or web address]  01/29/2021 10:05 AM

## 2021-01-29 ENCOUNTER — Inpatient Hospital Stay (HOSPITAL_BASED_OUTPATIENT_CLINIC_OR_DEPARTMENT_OTHER): Payer: BC Managed Care – PPO | Admitting: Hematology and Oncology

## 2021-01-29 ENCOUNTER — Inpatient Hospital Stay: Payer: BC Managed Care – PPO

## 2021-01-29 ENCOUNTER — Encounter: Payer: Self-pay | Admitting: Hematology and Oncology

## 2021-01-29 ENCOUNTER — Other Ambulatory Visit: Payer: Self-pay

## 2021-01-29 VITALS — BP 129/80 | HR 94 | Temp 97.7°F | Resp 17 | Ht 70.0 in | Wt 259.8 lb

## 2021-01-29 DIAGNOSIS — C61 Malignant neoplasm of prostate: Secondary | ICD-10-CM | POA: Diagnosis not present

## 2021-01-29 DIAGNOSIS — Z95828 Presence of other vascular implants and grafts: Secondary | ICD-10-CM | POA: Diagnosis not present

## 2021-01-29 DIAGNOSIS — Z87891 Personal history of nicotine dependence: Secondary | ICD-10-CM | POA: Diagnosis not present

## 2021-01-29 DIAGNOSIS — Z5111 Encounter for antineoplastic chemotherapy: Secondary | ICD-10-CM | POA: Diagnosis not present

## 2021-01-29 DIAGNOSIS — Z79899 Other long term (current) drug therapy: Secondary | ICD-10-CM | POA: Diagnosis not present

## 2021-01-29 DIAGNOSIS — C8121 Mixed cellularity classical Hodgkin lymphoma, lymph nodes of head, face, and neck: Secondary | ICD-10-CM

## 2021-01-29 DIAGNOSIS — D701 Agranulocytosis secondary to cancer chemotherapy: Secondary | ICD-10-CM | POA: Diagnosis not present

## 2021-01-29 DIAGNOSIS — T451X5A Adverse effect of antineoplastic and immunosuppressive drugs, initial encounter: Secondary | ICD-10-CM | POA: Diagnosis not present

## 2021-01-29 DIAGNOSIS — I10 Essential (primary) hypertension: Secondary | ICD-10-CM | POA: Diagnosis not present

## 2021-01-29 LAB — CBC WITH DIFFERENTIAL (CANCER CENTER ONLY)
Abs Immature Granulocytes: 0.04 10*3/uL (ref 0.00–0.07)
Basophils Absolute: 0.1 10*3/uL (ref 0.0–0.1)
Basophils Relative: 2 %
Eosinophils Absolute: 0.2 10*3/uL (ref 0.0–0.5)
Eosinophils Relative: 6 %
HCT: 40.7 % (ref 39.0–52.0)
Hemoglobin: 13.8 g/dL (ref 13.0–17.0)
Immature Granulocytes: 1 %
Lymphocytes Relative: 44 %
Lymphs Abs: 1.3 10*3/uL (ref 0.7–4.0)
MCH: 29.2 pg (ref 26.0–34.0)
MCHC: 33.9 g/dL (ref 30.0–36.0)
MCV: 86.2 fL (ref 80.0–100.0)
Monocytes Absolute: 0.7 10*3/uL (ref 0.1–1.0)
Monocytes Relative: 23 %
Neutro Abs: 0.7 10*3/uL — ABNORMAL LOW (ref 1.7–7.7)
Neutrophils Relative %: 24 %
Platelet Count: 228 10*3/uL (ref 150–400)
RBC: 4.72 MIL/uL (ref 4.22–5.81)
RDW: 14.4 % (ref 11.5–15.5)
WBC Count: 3 10*3/uL — ABNORMAL LOW (ref 4.0–10.5)
nRBC: 0 % (ref 0.0–0.2)

## 2021-01-29 LAB — CMP (CANCER CENTER ONLY)
ALT: 64 U/L — ABNORMAL HIGH (ref 0–44)
AST: 48 U/L — ABNORMAL HIGH (ref 15–41)
Albumin: 4 g/dL (ref 3.5–5.0)
Alkaline Phosphatase: 56 U/L (ref 38–126)
Anion gap: 9 (ref 5–15)
BUN: 11 mg/dL (ref 6–20)
CO2: 23 mmol/L (ref 22–32)
Calcium: 9.3 mg/dL (ref 8.9–10.3)
Chloride: 104 mmol/L (ref 98–111)
Creatinine: 0.9 mg/dL (ref 0.61–1.24)
GFR, Estimated: >60 mL/min (ref >60)
Glucose, Bld: 133 mg/dL — ABNORMAL HIGH (ref 70–99)
Potassium: 3.9 mmol/L (ref 3.5–5.1)
Sodium: 136 mmol/L (ref 135–145)
Total Bilirubin: 0.3 mg/dL (ref 0.3–1.2)
Total Protein: 7.4 g/dL (ref 6.5–8.1)

## 2021-01-29 LAB — LACTATE DEHYDROGENASE: LDH: 180 U/L (ref 98–192)

## 2021-01-29 MED ORDER — FAMOTIDINE IN NACL 20-0.9 MG/50ML-% IV SOLN: 20.0000 mg | Freq: Once | INTRAVENOUS | Status: DC | PRN

## 2021-01-29 MED ORDER — HOT PACK MISC ONCOLOGY
1.0000 | Freq: Once | Status: DC | PRN
  Filled 2021-01-29: qty 1

## 2021-01-29 MED ORDER — DOXORUBICIN HCL CHEMO IV INJECTION 2 MG/ML
25.0000 mg/m2 | Freq: Once | INTRAVENOUS | Status: AC
  Administered 2021-01-29: 62 mg via INTRAVENOUS
  Filled 2021-01-29: qty 31

## 2021-01-29 MED ORDER — SODIUM CHLORIDE 0.9 % IV SOLN
Freq: Once | INTRAVENOUS | Status: AC
  Filled 2021-01-29: qty 250

## 2021-01-29 MED ORDER — HEPARIN SOD (PORK) LOCK FLUSH 100 UNIT/ML IV SOLN
500.0000 [IU] | Freq: Once | INTRAVENOUS | Status: AC | PRN
  Administered 2021-01-29: 500 [IU]
  Filled 2021-01-29: qty 5

## 2021-01-29 MED ORDER — SODIUM CHLORIDE 0.9% FLUSH
10.0000 mL | INTRAVENOUS | Status: DC | PRN
  Administered 2021-01-29: 10 mL
  Filled 2021-01-29: qty 10

## 2021-01-29 MED ORDER — COLD PACK MISC ONCOLOGY
1.0000 | Freq: Once | Status: DC | PRN
  Filled 2021-01-29: qty 1

## 2021-01-29 MED ORDER — HEPARIN SOD (PORK) LOCK FLUSH 100 UNIT/ML IV SOLN
250.0000 [IU] | Freq: Once | INTRAVENOUS | Status: DC | PRN
  Filled 2021-01-29: qty 5

## 2021-01-29 MED ORDER — PALONOSETRON HCL INJECTION 0.25 MG/5ML
INTRAVENOUS | Status: AC
  Filled 2021-01-29: qty 5

## 2021-01-29 MED ORDER — ALBUTEROL SULFATE (2.5 MG/3ML) 0.083% IN NEBU
2.5000 mg | INHALATION_SOLUTION | Freq: Once | RESPIRATORY_TRACT | Status: DC | PRN
  Filled 2021-01-29: qty 3

## 2021-01-29 MED ORDER — EPINEPHRINE 0.3 MG/0.3ML IJ SOAJ: 0.3000 mg | Freq: Once | INTRAMUSCULAR | Status: DC | PRN

## 2021-01-29 MED ORDER — METHYLPREDNISOLONE SODIUM SUCC 125 MG IJ SOLR: 125.0000 mg | Freq: Once | INTRAMUSCULAR | Status: DC | PRN

## 2021-01-29 MED ORDER — DIPHENHYDRAMINE HCL 50 MG/ML IJ SOLN: 50.0000 mg | Freq: Once | INTRAMUSCULAR | Status: DC | PRN

## 2021-01-29 MED ORDER — SODIUM CHLORIDE 0.9 % IV SOLN
10.0000 mg | Freq: Once | INTRAVENOUS | Status: AC
  Administered 2021-01-29: 10 mg via INTRAVENOUS
  Filled 2021-01-29: qty 10

## 2021-01-29 MED ORDER — VINBLASTINE SULFATE CHEMO INJECTION 1 MG/ML
6.0000 mg/m2 | Freq: Once | INTRAVENOUS | Status: AC
  Administered 2021-01-29: 14.6 mg via INTRAVENOUS
  Filled 2021-01-29: qty 14.6

## 2021-01-29 MED ORDER — SODIUM CHLORIDE 0.9 % IV SOLN
375.0000 mg/m2 | Freq: Once | INTRAVENOUS | Status: AC
  Administered 2021-01-29: 920 mg via INTRAVENOUS
  Filled 2021-01-29: qty 92

## 2021-01-29 MED ORDER — SODIUM CHLORIDE 0.9% FLUSH
3.0000 mL | INTRAVENOUS | Status: DC | PRN
  Filled 2021-01-29: qty 10

## 2021-01-29 MED ORDER — PALONOSETRON HCL INJECTION 0.25 MG/5ML
0.2500 mg | Freq: Once | INTRAVENOUS | Status: AC
  Administered 2021-01-29: 0.25 mg via INTRAVENOUS

## 2021-01-29 MED ORDER — ALTEPLASE 2 MG IJ SOLR
2.0000 mg | Freq: Once | INTRAMUSCULAR | Status: DC | PRN
  Filled 2021-01-29: qty 2

## 2021-01-29 MED ORDER — SODIUM CHLORIDE 0.9 % IV SOLN
150.0000 mg | Freq: Once | INTRAVENOUS | Status: AC
  Administered 2021-01-29: 150 mg via INTRAVENOUS
  Filled 2021-01-29: qty 150

## 2021-01-29 MED ORDER — SODIUM CHLORIDE 0.9% FLUSH
10.0000 mL | INTRAVENOUS | Status: DC | PRN
  Administered 2021-01-29: 10 mL via INTRAVENOUS
  Filled 2021-01-29: qty 10

## 2021-01-29 MED ORDER — SODIUM CHLORIDE 0.9 % IV SOLN
Freq: Once | INTRAVENOUS | Status: DC | PRN
  Filled 2021-01-29: qty 250

## 2021-01-29 NOTE — Progress Notes (Signed)
Patient turned in paperwork from [facility] needing to be completed by provider.  Completed, copy to be scanned on file.  Original provided to the patient.    Per Dr Lorenso Courier 01/29/2021 patient ok to receive treatment with ANC 0.7

## 2021-01-29 NOTE — Patient Instructions (Signed)
Implanted Port Insertion, Care After This sheet gives you information about how to care for yourself after your procedure. Your health care provider may also give you more specific instructions. If you have problems or questions, contact your health care provider. What can I expect after the procedure? After the procedure, it is common to have:  Discomfort at the port insertion site.  Bruising on the skin over the port. This should improve over 3-4 days. Follow these instructions at home: Port care  After your port is placed, you will get a manufacturer's information card. The card has information about your port. Keep this card with you at all times.  Take care of the port as told by your health care provider. Ask your health care provider if you or a family member can get training for taking care of the port at home. A home health care nurse may also take care of the port.  Make sure to remember what type of port you have. Incision care  Follow instructions from your health care provider about how to take care of your port insertion site. Make sure you: ? Wash your hands with soap and water before and after you change your bandage (dressing). If soap and water are not available, use hand sanitizer. ? Change your dressing as told by your health care provider. ? Leave stitches (sutures), skin glue, or adhesive strips in place. These skin closures may need to stay in place for 2 weeks or longer. If adhesive strip edges start to loosen and curl up, you may trim the loose edges. Do not remove adhesive strips completely unless your health care provider tells you to do that.  Check your port insertion site every day for signs of infection. Check for: ? Redness, swelling, or pain. ? Fluid or blood. ? Warmth. ? Pus or a bad smell.      Activity  Return to your normal activities as told by your health care provider. Ask your health care provider what activities are safe for you.  Do not  lift anything that is heavier than 10 lb (4.5 kg), or the limit that you are told, until your health care provider says that it is safe. General instructions  Take over-the-counter and prescription medicines only as told by your health care provider.  Do not take baths, swim, or use a hot tub until your health care provider approves. Ask your health care provider if you may take showers. You may only be allowed to take sponge baths.  Do not drive for 24 hours if you were given a sedative during your procedure.  Wear a medical alert bracelet in case of an emergency. This will tell any health care providers that you have a port.  Keep all follow-up visits as told by your health care provider. This is important. Contact a health care provider if:  You cannot flush your port with saline as directed, or you cannot draw blood from the port.  You have a fever or chills.  You have redness, swelling, or pain around your port insertion site.  You have fluid or blood coming from your port insertion site.  Your port insertion site feels warm to the touch.  You have pus or a bad smell coming from the port insertion site. Get help right away if:  You have chest pain or shortness of breath.  You have bleeding from your port that you cannot control. Summary  Take care of the port as told by your   health care provider. Keep the manufacturer's information card with you at all times.  Change your dressing as told by your health care provider.  Contact a health care provider if you have a fever or chills or if you have redness, swelling, or pain around your port insertion site.  Keep all follow-up visits as told by your health care provider. This information is not intended to replace advice given to you by your health care provider. Make sure you discuss any questions you have with your health care provider. Document Revised: 06/19/2018 Document Reviewed: 06/19/2018 Elsevier Patient Education   2021 Elsevier Inc.  

## 2021-01-29 NOTE — Patient Instructions (Signed)
[facility] Discharge Instructions for Patients Receiving Chemotherapy  Today you received the following chemotherapy agents: Doxorubicin, vinblastine, DTIC  To help prevent nausea and vomiting after your treatment, we encourage you to take your nausea medication as directed.   If you develop nausea and vomiting that is not controlled by your nausea medication, call the clinic.   BELOW ARE SYMPTOMS THAT SHOULD BE REPORTED IMMEDIATELY:  *FEVER GREATER THAN 100.5 F  *CHILLS WITH OR WITHOUT FEVER  NAUSEA AND VOMITING THAT IS NOT CONTROLLED WITH YOUR NAUSEA MEDICATION  *UNUSUAL SHORTNESS OF BREATH  *UNUSUAL BRUISING OR BLEEDING  TENDERNESS IN MOUTH AND THROAT WITH OR WITHOUT PRESENCE OF ULCERS  *URINARY PROBLEMS  *BOWEL PROBLEMS  UNUSUAL RASH Items with * indicate a potential emergency and should be followed up as soon as possible.  Feel free to call the clinic should you have any questions or concerns. The clinic phone number is [phone].  Please show the [facility] at check-in to the Emergency Department and triage nurse.

## 2021-01-29 NOTE — Progress Notes (Signed)
Spoke to Dr. Libby Maw nurse Shelle Iron. Per Dr. Vianne Bulls okay to tx patient with [place] of 700.

## 2021-01-29 NOTE — Progress Notes (Signed)
The following biosimilar Udenyca ([id number]) has been selected for use in this patient.  Kennith Center, Pharm.D., CPP 01/29/2021@11 :22 AM

## 2021-01-30 ENCOUNTER — Inpatient Hospital Stay: Payer: BC Managed Care – PPO

## 2021-01-30 VITALS — BP 148/96 | HR 90 | Temp 97.0°F | Resp 18

## 2021-01-30 DIAGNOSIS — Z87891 Personal history of nicotine dependence: Secondary | ICD-10-CM | POA: Diagnosis not present

## 2021-01-30 DIAGNOSIS — I10 Essential (primary) hypertension: Secondary | ICD-10-CM | POA: Diagnosis not present

## 2021-01-30 DIAGNOSIS — C8121 Mixed cellularity classical Hodgkin lymphoma, lymph nodes of head, face, and neck: Secondary | ICD-10-CM | POA: Diagnosis not present

## 2021-01-30 DIAGNOSIS — D701 Agranulocytosis secondary to cancer chemotherapy: Secondary | ICD-10-CM | POA: Diagnosis not present

## 2021-01-30 DIAGNOSIS — Z5111 Encounter for antineoplastic chemotherapy: Secondary | ICD-10-CM | POA: Diagnosis not present

## 2021-01-30 DIAGNOSIS — T451X5A Adverse effect of antineoplastic and immunosuppressive drugs, initial encounter: Secondary | ICD-10-CM | POA: Diagnosis not present

## 2021-01-30 DIAGNOSIS — C61 Malignant neoplasm of prostate: Secondary | ICD-10-CM | POA: Diagnosis not present

## 2021-01-30 DIAGNOSIS — Z79899 Other long term (current) drug therapy: Secondary | ICD-10-CM | POA: Diagnosis not present

## 2021-01-30 MED ORDER — PEGFILGRASTIM-CBQV 6 MG/0.6ML ~~LOC~~ SOSY
6.0000 mg | PREFILLED_SYRINGE | Freq: Once | SUBCUTANEOUS | Status: AC
  Administered 2021-01-30: 6 mg via SUBCUTANEOUS

## 2021-02-01 ENCOUNTER — Telehealth: Payer: Self-pay | Admitting: *Deleted

## 2021-02-01 NOTE — Telephone Encounter (Signed)
[name] regarding his PET scan. Spoke with patient and advised that I have gotten his PET scan scheduled for  02/12/21 at [facility] with 6:30 am arrival time.  Pt is ok with that date and time. Advised that we will need to move his treatment  Days a little bit to accommodate Dr. Lorenso Courier getting the results of the PET scan. Advised pt to expect a call from scheduling about those appts.  He voiced understanding.  Scheduling message sent.

## 2021-02-04 ENCOUNTER — Telehealth: Payer: Self-pay | Admitting: Hematology and Oncology

## 2021-02-04 NOTE — Telephone Encounter (Signed)
Rescheduled appt per 2/28 sch msg - pt is aware of new appts on 3/14

## 2021-02-12 ENCOUNTER — Ambulatory Visit: Payer: BC Managed Care – PPO

## 2021-02-12 ENCOUNTER — Other Ambulatory Visit: Payer: Self-pay

## 2021-02-12 ENCOUNTER — Other Ambulatory Visit: Payer: BC Managed Care – PPO

## 2021-02-12 ENCOUNTER — Encounter (HOSPITAL_COMMUNITY)
Admission: RE | Admit: 2021-02-12 | Discharge: 2021-02-12 | Disposition: A | Discharge status: Home / Self Care | Payer: BC Managed Care – PPO | Source: Ambulatory Visit | Attending: Hematology and Oncology | Admitting: Hematology and Oncology

## 2021-02-12 ENCOUNTER — Ambulatory Visit: Payer: BC Managed Care – PPO | Admitting: Hematology and Oncology

## 2021-02-12 DIAGNOSIS — C8121 Mixed cellularity classical Hodgkin lymphoma, lymph nodes of head, face, and neck: Secondary | ICD-10-CM | POA: Diagnosis not present

## 2021-02-12 LAB — GLUCOSE, CAPILLARY: Glucose-Capillary: 154 mg/dL — ABNORMAL HIGH (ref 70–99)

## 2021-02-12 MED ORDER — FLUDEOXYGLUCOSE F - 18 (FDG) INJECTION
12.8400 | Freq: Once | INTRAVENOUS | Status: AC | PRN
  Administered 2021-02-12: 12.84 via INTRAVENOUS

## 2021-02-12 NOTE — Progress Notes (Signed)
[place] [phone]   [phone]  PROGRESS NOTE  Patient Care Team: London Pepper, MD as PCP - General (Family Medicine)  Hematological/Oncological History # Classical Hodgkin's Lymphoma, Mixed Cellularity. 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 2) 10/27/2020: resection of neck mass. Pathology showed classical Hodgkin's lymphoma, mixed cellularity subtype 3) 11/13/2020: establish care with Dr. Lorenso Courier  4) 11/26/2020: PET CT scan shows single hypermetabolic unenlarged right level II cervical lymph node identified on today's study. FDG uptake is compatible with [place] 4. Other small bilateral cervical and upper normal to borderline hepatoduodenal ligament lymph nodes show [place] 2 uptake levels 5) 12/18/2020: Cycle 1 Day 1 of AVD chemotherapy  6) 01/15/2021: Cycle 2 Day 1 of AVD chemotherapy  7) 02/12/2021: PET CT scan shows no evidence of residual disease 8) 02/15/2021:  Cycle 3 Day 1 of AVD chemotherapy   Interval History:  [name] 60 y.o. male with medical history significant for Classical Hodgkin's lymphoma Early Stage/Unfavorable risk who presents for a follow up visit. The patient's last visit was on 01/29/2021. In the interim since the last visit he had a PET CT scan which showed complete response to therapy with no residual disease.   On exam today Mr. Daubenspeck reports he is doing well and continues to tolerate the treatment without any significant limitations.  He reports that his energy levels are stable, having fatigue mainly the week of chemotherapy.  His energy level is to return back to baseline and he continues to complete all his ADLs on his own.  He has a good appetite without any noticeable weight loss.  Patient denies any nausea, vomiting or abdominal pain.  He does have daily bowel movements without any hematochezia or melena.  Since his  second chemotherapy treatment, patient does notice some gum soreness.  This does not interfere with his oral intake and he denies any redness or discharge.  Patient denies any fevers, chills, night sweats, shortness of breath, chest pain or cough.  He has no other complaints. A full 10 point ROS is listed below.  MEDICAL HISTORY:  Past Medical History:  Diagnosis Date  . Cancer ([place]) 10/15   recent dx prostate ca-no tx yet  . GERD (gastroesophageal reflux disease)   . Hypertension   . Pneumonia    history  . Teeth decayed     SURGICAL HISTORY: Past Surgical History:  Procedure Laterality Date  . IR IMAGING GUIDED PORT INSERTION  12/11/2020  . MASS BIOPSY Left 09/29/2014   Procedure: LEFT LOWER NECK MASS EXCISION;  Surgeon: Izora Gala, MD;  Location: [place];  Service: ENT;  Laterality: Left;  . PAROTIDECTOMY Left 09/29/2014   Procedure: LEFT PAROTIDECTOMY;  Surgeon: Izora Gala, MD;  Location: [place];  Service: ENT;  Laterality: Left;  . SEPTOPLASTY  1976    SOCIAL HISTORY: Social History   Socioeconomic History  . Marital status: Widowed    Spouse name: Not on file  . Number of children: Not on file  . Years of education: Not on file  . Highest education level: Not on file  Occupational History  . Not on file  Tobacco Use  . Smoking status: Former Smoker    Packs/day: 3.00    Years: 32.00    Pack years: 96.00    Types: Cigarettes    Quit date: 11/26/2017    Years since quitting: 3.2  .  Smokeless tobacco: Never Used  Substance and Sexual Activity  . Alcohol use: Yes    Comment: twice a year  . Drug use: No  . Sexual activity: Not on file  Other Topics Concern  . Not on file  Social History Narrative  . Not on file   Social Determinants of Health   Financial Resource Strain: Not on file  Food Insecurity: Not on file  Transportation Needs: Not on file  Physical Activity: Not on file  Stress: Not on file  Social  Connections: Not on file  Intimate Partner Violence: Not on file    FAMILY HISTORY: Family History  Problem Relation Age of Onset  . Stroke Mother   . Heart disease Father     ALLERGIES:  has No Known Allergies.  MEDICATIONS:  Current Outpatient Medications  Medication Sig Dispense Refill  . acetaminophen (TYLENOL) 500 MG tablet Take 500 mg by mouth every 6 (six) hours as needed for headache.    Marland Kitchen amLODipine (NORVASC) 5 MG tablet Take 5 mg by mouth every evening.    . B Complex-Folic Acid (B COMPLEX FORMULA 1, W/ FA,) TABS Take 1 tablet by mouth daily. 30 tablet 3  . lidocaine-prilocaine (EMLA) cream Apply 1 application topically as needed. (Patient taking differently: Apply 1 application topically as needed (access port).) 30 g 0  . losartan (COZAAR) 100 MG tablet Take 100 mg by mouth every evening.    . metFORMIN (GLUCOPHAGE) 500 MG tablet Take 1 tablet by mouth 2 (two) times daily.    . ondansetron (ZOFRAN) 8 MG tablet Take 1 tablet (8 mg total) by mouth every 8 (eight) hours as needed for nausea or vomiting. 39 tablet 0  . pantoprazole (PROTONIX) 40 MG tablet Take 1 tablet by mouth every evening.    . prochlorperazine (COMPAZINE) 10 MG tablet Take 1 tablet (10 mg total) by mouth every 6 (six) hours as needed for nausea or vomiting. 30 tablet 0  . rosuvastatin (CRESTOR) 5 MG tablet Take 5 mg by mouth every evening.     No current facility-administered medications for this visit.   Facility-Administered Medications Ordered in Other Visits  Medication Dose Route Frequency Provider Last Rate Last Admin  . dacarbazine (DTIC) 920 mg in sodium chloride 0.9 % 250 mL chemo infusion  375 mg/m2 (Treatment Plan Recorded) Intravenous Once Ledell Peoples IV, MD      . heparin lock flush 100 unit/mL  500 Units Intracatheter Once PRN Ledell Peoples IV, MD      . sodium chloride flush (NS) 0.9 % injection 10 mL  10 mL Intracatheter PRN Ledell Peoples IV, MD      . vinBLAStine (VELBAN) 14.6 mg in  sodium chloride 0.9 % 50 mL chemo infusion  6 mg/m2 (Treatment Plan Recorded) Intravenous Once Orson Slick, MD 388 mL/hr at 02/15/21 1403 14.6 mg at 02/15/21 1403    REVIEW OF SYSTEMS:   Constitutional: ( - ) fevers, ( - )  chills , ( - ) night sweats Eyes: ( - ) blurriness of vision, ( - ) double vision, ( - ) watery eyes Ears, nose, mouth, throat, and face: ( - ) mucositis, ( - ) sore throat Respiratory: ( - ) cough, ( - ) dyspnea, ( - ) wheezes Cardiovascular: ( - ) palpitation, ( - ) chest discomfort, ( - ) lower extremity swelling Gastrointestinal:  ( - ) nausea, ( - ) heartburn, ( - ) change in bowel habits Skin: ( - )  abnormal skin rashes Lymphatics: ( - ) new lymphadenopathy, ( - ) easy bruising Neurological: ( - ) numbness, ( - ) tingling, ( - ) new weaknesses Behavioral/Psych: ( - ) mood change, ( - ) new changes  All other systems were reviewed with the patient and are negative.  PHYSICAL EXAMINATION: ECOG PERFORMANCE STATUS: 1 - Symptomatic but completely ambulatory  Vitals:   02/15/21 0907  BP: 132/70  Pulse: 98  Resp: 19  Temp: 98.7 F (37.1 C)  SpO2: 98%   Filed Weights   02/15/21 0907  Weight: 262 lb 9.6 oz (119.1 kg)    GENERAL: well appearing middle aged Caucasian male in NAD ORAL: No erythema or purulent discharge noted.  SKIN: skin color, texture, turgor are normal, no rashes or significant lesions EYES: conjunctiva are pink and non-injected, sclera clear NECK: supple, non-tender LYMPH:  Prior surgical scars on right neck, some palpable tissue (unclear if residual node or scar tissue) no palpable lymphadenopathy in the cervical, axillary or supraclavicular lymph nodes.  LUNGS: clear to auscultation and percussion with normal breathing effort HEART: regular rate & rhythm and no murmurs and no lower extremity edema Musculoskeletal: no cyanosis of digits and no clubbing  PSYCH: alert & oriented x 3, fluent speech NEURO: no focal motor/sensory  deficits  LABORATORY DATA:  I have reviewed the data as listed CBC Latest Ref Rng & Units 02/15/2021 01/29/2021 01/19/2021  WBC 4.0 - 10.5 K/uL 7.5 3.0(L) 2.8(L)  Hemoglobin 13.0 - 17.0 g/dL 12.8(L) 13.8 13.6  Hematocrit 39.0 - 52.0 % 38.4(L) 40.7 39.1  Platelets 150 - 400 K/uL 167 228 109(L)    CMP Latest Ref Rng & Units 02/15/2021 01/29/2021 01/19/2021  Glucose 70 - 99 mg/dL 205(H) 133(H) 132(H)  BUN 6 - 20 mg/dL '11 11 12  ' Creatinine 0.61 - 1.24 mg/dL 0.90 0.90 0.78  Sodium 135 - 145 mmol/L 138 136 134(L)  Potassium 3.5 - 5.1 mmol/L 3.6 3.9 4.0  Chloride 98 - 111 mmol/L 105 104 100  CO2 22 - 32 mmol/L '23 23 26  ' Calcium 8.9 - 10.3 mg/dL 9.1 9.3 9.3  Total Protein 6.5 - 8.1 g/dL 7.2 7.4 -  Total Bilirubin 0.3 - 1.2 mg/dL 0.3 0.3 -  Alkaline Phos 38 - 126 U/L 64 56 -  AST 15 - 41 U/L 41 48(H) -  ALT 0 - 44 U/L 46(H) 64(H) -    No results found for: MPROTEIN No results found for: KPAFRELGTCHN, LAMBDASER, KAPLAMBRATIO   RADIOGRAPHIC STUDIES:  NM PET Image Restag (PS) Skull Base To Thigh  Result Date: 02/12/2021 CLINICAL DATA:  Subsequent treatment strategy for mixed cellularity Hodgkin's lymphoma. EXAM: NUCLEAR MEDICINE PET SKULL BASE TO THIGH TECHNIQUE: 12.8 mCi F-18 FDG was injected intravenously. Full-ring PET imaging was performed from the skull base to thigh after the radiotracer. CT data was obtained and used for attenuation correction and anatomic localization. Fasting blood glucose: 154 mg/dl COMPARISON:  11/26/2020 FINDINGS: Mediastinal blood pool activity: SUV max 2.1 Liver activity: SUV max 3.1 NECK: Hypermetabolic nodule in the RIGHT neck along the inferior margin of the RIGHT parotid gland measures 6 mm short axis and has associated metabolic activity with SUV max equal 3.7. There is misregistration between the PET imaging and the CT imaging. This lesion is not changed in size from comparison CT scan with the lesion had SUV max equal 4.6. More remote scan from 2015 also showed  a similar node/nodule at this level. 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  No hypermetabolic nodes in the neck. Incidental CT findings: none CHEST: No hypermetabolic mediastinal or hilar nodes. No suspicious pulmonary nodules on the CT scan. Incidental CT findings: none ABDOMEN/PELVIS: No significant metabolic activity associated with small periportal upper abdominal lymph nodes. Spleen is normal volume and normal metabolic activity. No abdominopelvic adenopathy. Incidental CT findings: Atherosclerotic calcification of the aorta. SKELETON: There is diffuse mild metabolic activity within the marrow space. Incidental CT findings: none IMPRESSION: 1. No evidence of lymphoma on skull base to thigh FDG PET scan. 2. Persistent metabolic activity within a nodule at the tail of the RIGHT parotid gland. Favor primary parotid neoplasm. 3. Mild uniform activity in the marrow space is favored related to anemia or drug response. Electronically Signed   By: Suzy Bouchard M.D.   On: 02/12/2021 17:19   DG Chest Port 1 View  Result Date: 01/17/2021 CLINICAL DATA:  Questionable sepsis EXAM: PORTABLE CHEST 1 VIEW COMPARISON:  09/06/2019 FINDINGS: The heart size and mediastinal contours are within normal limits. Right chest port catheter. Both lungs are clear. The visualized skeletal structures are unremarkable. IMPRESSION: No acute abnormality of the lungs in AP portable projection. Electronically Signed   By: Eddie Candle M.D.   On: 01/17/2021 18:28    ASSESSMENT & PLAN Qamar Aughenbaugh 60 y.o. male with medical history significant for Classical Hodgkin's lymphoma Early Stage/Unfavorable risk who presents for a follow up visit.  After review the labs, the records, review of the PFTs, and review the echocardiogram the findings are most consistent with an early stage/unfavorable risk classical Hodgkin's lymphoma.  Given that he meets unfavorable criteria via multiple guidelines I would recommend that we proceed  with treatment as recommended in the NCCN guidelines for patients with early stage unfavorable disease (which is the exact same treatment recommended for stage III-IV disease).  Previously we discussed the AVD chemotherapy regimen.  We discussed the expected side effects including possible hair loss, nausea, vomiting, diarrhea, constipation, cytopenias, neutropenia, cardiac dysfunction, and neurological damage.  The patient will be treated with every 2 week rounds of chemotherapy using this regimen. AVD chemotherapy consists of doxorubicin 25 mg/m on days 1 and 15, vinblastine 6 mg per metered squared IV on days 1 and 15, and dacarbazine 375 mg/m IV on days 1 and 15.  After 2 cycles of AVD chemotherapy, PET/CT scan from 02/12/2021 revealed complete response to therapy with no evidence of lymphoma. The recommendation is to proceed with a further 4 cycles of AVD therapy.   # Classical Hodgkin's Lymphoma, Mixed Cellularity. 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 There are some smaller lymph nodes in the neck and one near the hepatoduodenal ligament, though would favor diagnosis of early stage --based on these criteria the recommendation would be for AVD x 2 cycles with interval PET CT scan followed by AVD x 4 cycles.  --interval PET on 02/12/2021 showed complete response to therapy --today is Cycle 3 Day 1 of AVD chemotherapy.  --will avoid bleomycin in this patient due to smoking history, mild abnormalities on PFTs and age --RTC for Cycle 3 Day 15 of treatment in 2 weeks   #Neutropenia, Severe --expected with this chemotherapy regimen. The use of GCSF with this chemotherapy is not routinely recommended as these patients rarely get neutropenic fever despite  low  ANCs --however, this patient had an episode of febrile neutropenia. Will pursue GCSF therapy following his treatment.   #Supportive Care --chemotherapy education to be scheduled  --zofran 59m q8H PRN and compazine 14mPO q6H for nausea -- EMLA cream for port -- no pain medication required at this time.   #Gum soreness: --uncertain etiology but physical exam did no reveal any gross evidence of infection. --Recommended a follow up with dentist for further workup.   #FDG avidity at right parotid gland: --Found to have persistent metabolic activity within a nodule at the tail of the right parotid gland --Will pursue biopsy.   No orders of the defined types were placed in this encounter.  All questions were answered. The patient knows to call the clinic with any problems, questions or concerns.  A total of more than 30 minutes were spent on this encounter and over half of that time was spent on counseling and coordination of care as outlined above.   JoLedell PeoplesMD Department of Hematology/Oncology [facility] WeOur [facility] Hospitalhone: [phone]: [phone]: [email or web address]  02/15/2021 2:13 PM

## 2021-02-15 ENCOUNTER — Inpatient Hospital Stay (HOSPITAL_BASED_OUTPATIENT_CLINIC_OR_DEPARTMENT_OTHER): Payer: BC Managed Care – PPO | Admitting: Hematology and Oncology

## 2021-02-15 ENCOUNTER — Encounter: Payer: Self-pay | Admitting: Hematology and Oncology

## 2021-02-15 ENCOUNTER — Other Ambulatory Visit: Payer: Self-pay

## 2021-02-15 ENCOUNTER — Inpatient Hospital Stay: Payer: BC Managed Care – PPO

## 2021-02-15 ENCOUNTER — Inpatient Hospital Stay: Payer: BC Managed Care – PPO | Attending: Hematology and Oncology

## 2021-02-15 VITALS — BP 132/70 | HR 98 | Temp 98.7°F | Resp 19 | Ht 70.0 in | Wt 262.6 lb

## 2021-02-15 DIAGNOSIS — R5081 Fever presenting with conditions classified elsewhere: Secondary | ICD-10-CM | POA: Insufficient documentation

## 2021-02-15 DIAGNOSIS — Z7189 Other specified counseling: Secondary | ICD-10-CM

## 2021-02-15 DIAGNOSIS — D490 Neoplasm of unspecified behavior of digestive system: Secondary | ICD-10-CM

## 2021-02-15 DIAGNOSIS — Z1501 Genetic susceptibility to malignant neoplasm of breast: Secondary | ICD-10-CM | POA: Insufficient documentation

## 2021-02-15 DIAGNOSIS — T451X5A Adverse effect of antineoplastic and immunosuppressive drugs, initial encounter: Secondary | ICD-10-CM | POA: Insufficient documentation

## 2021-02-15 DIAGNOSIS — Z8546 Personal history of malignant neoplasm of prostate: Secondary | ICD-10-CM | POA: Insufficient documentation

## 2021-02-15 DIAGNOSIS — Z5111 Encounter for antineoplastic chemotherapy: Secondary | ICD-10-CM | POA: Insufficient documentation

## 2021-02-15 DIAGNOSIS — C8121 Mixed cellularity classical Hodgkin lymphoma, lymph nodes of head, face, and neck: Secondary | ICD-10-CM | POA: Diagnosis not present

## 2021-02-15 DIAGNOSIS — I10 Essential (primary) hypertension: Secondary | ICD-10-CM | POA: Insufficient documentation

## 2021-02-15 DIAGNOSIS — Z8249 Family history of ischemic heart disease and other diseases of the circulatory system: Secondary | ICD-10-CM | POA: Diagnosis not present

## 2021-02-15 DIAGNOSIS — Z95828 Presence of other vascular implants and grafts: Secondary | ICD-10-CM

## 2021-02-15 DIAGNOSIS — Z79899 Other long term (current) drug therapy: Secondary | ICD-10-CM | POA: Insufficient documentation

## 2021-02-15 DIAGNOSIS — D701 Agranulocytosis secondary to cancer chemotherapy: Secondary | ICD-10-CM | POA: Diagnosis not present

## 2021-02-15 DIAGNOSIS — Z7984 Long term (current) use of oral hypoglycemic drugs: Secondary | ICD-10-CM | POA: Diagnosis not present

## 2021-02-15 DIAGNOSIS — Z87891 Personal history of nicotine dependence: Secondary | ICD-10-CM | POA: Diagnosis not present

## 2021-02-15 LAB — CBC WITH DIFFERENTIAL (CANCER CENTER ONLY)
Abs Immature Granulocytes: 0.08 10*3/uL — ABNORMAL HIGH (ref 0.00–0.07)
Basophils Absolute: 0.1 10*3/uL (ref 0.0–0.1)
Basophils Relative: 1 %
Eosinophils Absolute: 0.2 10*3/uL (ref 0.0–0.5)
Eosinophils Relative: 2 %
HCT: 38.4 % — ABNORMAL LOW (ref 39.0–52.0)
Hemoglobin: 12.8 g/dL — ABNORMAL LOW (ref 13.0–17.0)
Immature Granulocytes: 1 %
Lymphocytes Relative: 15 %
Lymphs Abs: 1.1 10*3/uL (ref 0.7–4.0)
MCH: 29.4 pg (ref 26.0–34.0)
MCHC: 33.3 g/dL (ref 30.0–36.0)
MCV: 88.1 fL (ref 80.0–100.0)
Monocytes Absolute: 0.6 10*3/uL (ref 0.1–1.0)
Monocytes Relative: 8 %
Neutro Abs: 5.4 10*3/uL (ref 1.7–7.7)
Neutrophils Relative %: 73 %
Platelet Count: 167 10*3/uL (ref 150–400)
RBC: 4.36 MIL/uL (ref 4.22–5.81)
RDW: 15.7 % — ABNORMAL HIGH (ref 11.5–15.5)
WBC Count: 7.5 10*3/uL (ref 4.0–10.5)
nRBC: 0 % (ref 0.0–0.2)

## 2021-02-15 LAB — CMP (CANCER CENTER ONLY)
ALT: 46 U/L — ABNORMAL HIGH (ref 0–44)
AST: 41 U/L (ref 15–41)
Albumin: 3.8 g/dL (ref 3.5–5.0)
Alkaline Phosphatase: 64 U/L (ref 38–126)
Anion gap: 10 (ref 5–15)
BUN: 11 mg/dL (ref 6–20)
CO2: 23 mmol/L (ref 22–32)
Calcium: 9.1 mg/dL (ref 8.9–10.3)
Chloride: 105 mmol/L (ref 98–111)
Creatinine: 0.9 mg/dL (ref 0.61–1.24)
GFR, Estimated: >60 mL/min (ref >60)
Glucose, Bld: 205 mg/dL — ABNORMAL HIGH (ref 70–99)
Potassium: 3.6 mmol/L (ref 3.5–5.1)
Sodium: 138 mmol/L (ref 135–145)
Total Bilirubin: 0.3 mg/dL (ref 0.3–1.2)
Total Protein: 7.2 g/dL (ref 6.5–8.1)

## 2021-02-15 LAB — LACTATE DEHYDROGENASE: LDH: 178 U/L (ref 98–192)

## 2021-02-15 MED ORDER — PALONOSETRON HCL INJECTION 0.25 MG/5ML
INTRAVENOUS | Status: AC
  Filled 2021-02-15: qty 5

## 2021-02-15 MED ORDER — PALONOSETRON HCL INJECTION 0.25 MG/5ML
0.2500 mg | Freq: Once | INTRAVENOUS | Status: AC
  Administered 2021-02-15: 0.25 mg via INTRAVENOUS

## 2021-02-15 MED ORDER — SODIUM CHLORIDE 0.9 % IV SOLN
10.0000 mg | Freq: Once | INTRAVENOUS | Status: AC
  Administered 2021-02-15: 10 mg via INTRAVENOUS
  Filled 2021-02-15: qty 10

## 2021-02-15 MED ORDER — HEPARIN SOD (PORK) LOCK FLUSH 100 UNIT/ML IV SOLN
500.0000 [IU] | Freq: Once | INTRAVENOUS | Status: AC | PRN
  Administered 2021-02-15: 500 [IU]
  Filled 2021-02-15: qty 5

## 2021-02-15 MED ORDER — SODIUM CHLORIDE 0.9% FLUSH
10.0000 mL | INTRAVENOUS | Status: DC | PRN
  Administered 2021-02-15: 10 mL via INTRAVENOUS
  Filled 2021-02-15: qty 10

## 2021-02-15 MED ORDER — SODIUM CHLORIDE 0.9 % IV SOLN
375.0000 mg/m2 | Freq: Once | INTRAVENOUS | Status: AC
  Administered 2021-02-15: 920 mg via INTRAVENOUS
  Filled 2021-02-15: qty 92

## 2021-02-15 MED ORDER — SODIUM CHLORIDE 0.9 % IV SOLN
150.0000 mg | Freq: Once | INTRAVENOUS | Status: AC
  Administered 2021-02-15: 150 mg via INTRAVENOUS
  Filled 2021-02-15: qty 150

## 2021-02-15 MED ORDER — VINBLASTINE SULFATE CHEMO INJECTION 1 MG/ML
6.0000 mg/m2 | Freq: Once | INTRAVENOUS | Status: AC
  Administered 2021-02-15: 14.6 mg via INTRAVENOUS
  Filled 2021-02-15: qty 14.6

## 2021-02-15 MED ORDER — SODIUM CHLORIDE 0.9% FLUSH
10.0000 mL | INTRAVENOUS | Status: DC | PRN
  Administered 2021-02-15: 10 mL
  Filled 2021-02-15: qty 10

## 2021-02-15 MED ORDER — DOXORUBICIN HCL CHEMO IV INJECTION 2 MG/ML
25.0000 mg/m2 | Freq: Once | INTRAVENOUS | Status: AC
  Administered 2021-02-15: 62 mg via INTRAVENOUS
  Filled 2021-02-15: qty 31

## 2021-02-15 MED ORDER — SODIUM CHLORIDE 0.9 % IV SOLN
Freq: Once | INTRAVENOUS | Status: AC
  Filled 2021-02-15: qty 250

## 2021-02-15 NOTE — Patient Instructions (Signed)
[facility] Discharge Instructions for Patients Receiving Chemotherapy  Today you received the following chemotherapy agents: Doxorubicin, vinblastine, DTIC  To help prevent nausea and vomiting after your treatment, we encourage you to take your nausea medication as directed.   If you develop nausea and vomiting that is not controlled by your nausea medication, call the clinic.   BELOW ARE SYMPTOMS THAT SHOULD BE REPORTED IMMEDIATELY:  *FEVER GREATER THAN 100.5 F  *CHILLS WITH OR WITHOUT FEVER  NAUSEA AND VOMITING THAT IS NOT CONTROLLED WITH YOUR NAUSEA MEDICATION  *UNUSUAL SHORTNESS OF BREATH  *UNUSUAL BRUISING OR BLEEDING  TENDERNESS IN MOUTH AND THROAT WITH OR WITHOUT PRESENCE OF ULCERS  *URINARY PROBLEMS  *BOWEL PROBLEMS  UNUSUAL RASH Items with * indicate a potential emergency and should be followed up as soon as possible.  Feel free to call the clinic should you have any questions or concerns. The clinic phone number is [phone].  Please show the [place] at check-in to the Emergency Department and triage nurse.

## 2021-02-15 NOTE — Patient Instructions (Signed)
Implanted Port Insertion, Care After This sheet gives you information about how to care for yourself after your procedure. Your health care provider may also give you more specific instructions. If you have problems or questions, contact your health care provider. What can I expect after the procedure? After the procedure, it is common to have:  Discomfort at the port insertion site.  Bruising on the skin over the port. This should improve over 3-4 days. Follow these instructions at home: Port care  After your port is placed, you will get a manufacturer's information card. The card has information about your port. Keep this card with you at all times.  Take care of the port as told by your health care provider. Ask your health care provider if you or a family member can get training for taking care of the port at home. A home health care nurse may also take care of the port.  Make sure to remember what type of port you have. Incision care  Follow instructions from your health care provider about how to take care of your port insertion site. Make sure you: ? Wash your hands with soap and water before and after you change your bandage (dressing). If soap and water are not available, use hand sanitizer. ? Change your dressing as told by your health care provider. ? Leave stitches (sutures), skin glue, or adhesive strips in place. These skin closures may need to stay in place for 2 weeks or longer. If adhesive strip edges start to loosen and curl up, you may trim the loose edges. Do not remove adhesive strips completely unless your health care provider tells you to do that.  Check your port insertion site every day for signs of infection. Check for: ? Redness, swelling, or pain. ? Fluid or blood. ? Warmth. ? Pus or a bad smell.      Activity  Return to your normal activities as told by your health care provider. Ask your health care provider what activities are safe for you.  Do not  lift anything that is heavier than 10 lb (4.5 kg), or the limit that you are told, until your health care provider says that it is safe. General instructions  Take over-the-counter and prescription medicines only as told by your health care provider.  Do not take baths, swim, or use a hot tub until your health care provider approves. Ask your health care provider if you may take showers. You may only be allowed to take sponge baths.  Do not drive for 24 hours if you were given a sedative during your procedure.  Wear a medical alert bracelet in case of an emergency. This will tell any health care providers that you have a port.  Keep all follow-up visits as told by your health care provider. This is important. Contact a health care provider if:  You cannot flush your port with saline as directed, or you cannot draw blood from the port.  You have a fever or chills.  You have redness, swelling, or pain around your port insertion site.  You have fluid or blood coming from your port insertion site.  Your port insertion site feels warm to the touch.  You have pus or a bad smell coming from the port insertion site. Get help right away if:  You have chest pain or shortness of breath.  You have bleeding from your port that you cannot control. Summary  Take care of the port as told by your   health care provider. Keep the manufacturer's information card with you at all times.  Change your dressing as told by your health care provider.  Contact a health care provider if you have a fever or chills or if you have redness, swelling, or pain around your port insertion site.  Keep all follow-up visits as told by your health care provider. This information is not intended to replace advice given to you by your health care provider. Make sure you discuss any questions you have with your health care provider. Document Revised: 06/19/2018 Document Reviewed: 06/19/2018 Elsevier Patient Education   2021 Elsevier Inc.  

## 2021-02-17 ENCOUNTER — Encounter (HOSPITAL_COMMUNITY): Payer: Self-pay

## 2021-02-17 NOTE — Progress Notes (Signed)
DC    1       Ellwood Steidle Male, 60 y.o., 06-29-1961  MRN:  [id number] Phone:  [phone] Jerilynn Mages)       PCP:  London Pepper, MD Coverage:  Sherre Poot [employer] Ppo  Next Appt With Radiology ([facility]-US 2) 02/22/2021 at 1:00 PM           RE: Biopsy Received: Yesterday  Message Details  Arne Cleveland, MD  Ernestene Mention   [place] FNA R parotid nodule   DDH    Previous Messages  ----- Message -----  From: Lenore Cordia  Sent: 02/16/2021  3:25 PM EDT  To: Ir Procedure Requests  Subject: Biopsy                      Procedure Requested: CT Biopsy    Reason for Procedure: Parotid gland lesion, requesting biopsy    Provider Requesting: Orson Slick  Provider Telephone: [phone]    Other

## 2021-02-18 ENCOUNTER — Telehealth: Payer: Self-pay | Admitting: Hematology and Oncology

## 2021-02-18 ENCOUNTER — Ambulatory Visit: Payer: BC Managed Care – PPO

## 2021-02-18 NOTE — Telephone Encounter (Signed)
Scheduled per los. Called and left msg. Mailed printout  °

## 2021-02-19 ENCOUNTER — Other Ambulatory Visit: Payer: Self-pay | Admitting: Radiology

## 2021-02-22 ENCOUNTER — Encounter (HOSPITAL_COMMUNITY): Payer: Self-pay

## 2021-02-22 ENCOUNTER — Ambulatory Visit (HOSPITAL_COMMUNITY)
Admission: RE | Admit: 2021-02-22 | Discharge: 2021-02-22 | Disposition: A | Discharge status: Home / Self Care | Payer: BC Managed Care – PPO | Source: Ambulatory Visit | Attending: Hematology and Oncology | Admitting: Hematology and Oncology

## 2021-02-22 ENCOUNTER — Other Ambulatory Visit: Payer: Self-pay

## 2021-02-22 ENCOUNTER — Other Ambulatory Visit: Payer: Self-pay | Admitting: Radiology

## 2021-02-22 ENCOUNTER — Other Ambulatory Visit: Payer: Self-pay | Admitting: Hematology and Oncology

## 2021-02-22 DIAGNOSIS — D490 Neoplasm of unspecified behavior of digestive system: Secondary | ICD-10-CM

## 2021-02-22 DIAGNOSIS — E041 Nontoxic single thyroid nodule: Secondary | ICD-10-CM | POA: Diagnosis not present

## 2021-02-22 DIAGNOSIS — K118 Other diseases of salivary glands: Secondary | ICD-10-CM | POA: Diagnosis not present

## 2021-02-22 LAB — CBC WITH DIFFERENTIAL/PLATELET
Abs Immature Granulocytes: 0.05 10*3/uL (ref 0.00–0.07)
Basophils Absolute: 0.1 10*3/uL (ref 0.0–0.1)
Basophils Relative: 2 %
Eosinophils Absolute: 0.1 10*3/uL (ref 0.0–0.5)
Eosinophils Relative: 4 %
HCT: 35.4 % — ABNORMAL LOW (ref 39.0–52.0)
Hemoglobin: 11.9 g/dL — ABNORMAL LOW (ref 13.0–17.0)
Immature Granulocytes: 2 %
Lymphocytes Relative: 25 %
Lymphs Abs: 0.8 10*3/uL (ref 0.7–4.0)
MCH: 29.8 pg (ref 26.0–34.0)
MCHC: 33.6 g/dL (ref 30.0–36.0)
MCV: 88.5 fL (ref 80.0–100.0)
Monocytes Absolute: 0.2 10*3/uL (ref 0.1–1.0)
Monocytes Relative: 5 %
Neutro Abs: 2 10*3/uL (ref 1.7–7.7)
Neutrophils Relative %: 62 %
Platelets: 207 10*3/uL (ref 150–400)
RBC: 4 MIL/uL — ABNORMAL LOW (ref 4.22–5.81)
RDW: 15.1 % (ref 11.5–15.5)
WBC: 3.2 10*3/uL — ABNORMAL LOW (ref 4.0–10.5)
nRBC: 0 % (ref 0.0–0.2)

## 2021-02-22 LAB — GLUCOSE, CAPILLARY: Glucose-Capillary: 113 mg/dL — ABNORMAL HIGH (ref 70–99)

## 2021-02-22 MED ORDER — LIDOCAINE HCL 1 % IJ SOLN
INTRAMUSCULAR | Status: AC
  Filled 2021-02-22: qty 20

## 2021-02-22 MED ORDER — HEPARIN SOD (PORK) LOCK FLUSH 100 UNIT/ML IV SOLN
500.0000 [IU] | Freq: Once | INTRAVENOUS | Status: AC
  Administered 2021-02-22: 500 [IU] via INTRAVENOUS
  Filled 2021-02-22: qty 5

## 2021-02-22 MED ORDER — MIDAZOLAM HCL 2 MG/2ML IJ SOLN
INTRAMUSCULAR | Status: AC
  Filled 2021-02-22: qty 2

## 2021-02-22 MED ORDER — SODIUM CHLORIDE 0.9 % IV SOLN: INTRAVENOUS | Status: DC

## 2021-02-22 MED ORDER — FENTANYL CITRATE (PF) 100 MCG/2ML IJ SOLN
INTRAMUSCULAR | Status: AC | PRN
  Administered 2021-02-22: 50 ug via INTRAVENOUS

## 2021-02-22 MED ORDER — SODIUM CHLORIDE 0.9 % IV SOLN: INTRAVENOUS | Status: AC

## 2021-02-22 MED ORDER — FENTANYL CITRATE (PF) 100 MCG/2ML IJ SOLN
INTRAMUSCULAR | Status: AC
  Filled 2021-02-22: qty 2

## 2021-02-22 NOTE — Procedures (Signed)
Interventional Radiology Note    Findings: US performed of the rt parotid in preparation for possible biopsy of a PET/CT finding.  Ultrasound demonstrates normal-appearing parotid gland with 2 small benign-appearing intraparotid lymph nodes, both less than 1 cm in size, with preserved thin hypoechoic cortex and fatty hila.  These appear to correlate with the PET/CT finding.  No other suspicious parotid lesion demonstrated.  Therefore biopsy not performed.  Findings discussed with the patient.  Recommend continued close follow-up on surveillance imaging.  Tamera Punt, MD

## 2021-02-22 NOTE — H&P (Signed)
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  He has a history of Hodgkin's lymphoma diagnosed in November of last year and recent PET scan revealing persistent metabolic activity within the nodule at the tail of the right parotid gland.  He presents today for image guided biopsy of this nodule for further evaluation.  He also has a history of prostate cancer which is under observation at this time.  He currently denies fever, headache, chest pain, dyspnea, cough, abdominal/back pain, nausea, vomiting or bleeding.  Past Medical History:  Diagnosis Date   Cancer ([place]) 10/15   recent dx prostate ca-no tx yet   GERD (gastroesophageal reflux disease)    Hypertension    Pneumonia    history   Teeth decayed    Past Surgical History:  Procedure Laterality Date   IR IMAGING GUIDED PORT INSERTION  12/11/2020   MASS BIOPSY Left 09/29/2014   Procedure: LEFT LOWER NECK MASS EXCISION;  Surgeon: Izora Gala, MD;  Location: [facility];  Service: ENT;  Laterality: Left;   PAROTIDECTOMY Left 09/29/2014   Procedure: LEFT PAROTIDECTOMY;  Surgeon: Izora Gala, MD;  Location: [facility];  Service: ENT;  Laterality: Left;   SEPTOPLASTY  1976      Allergies: Patient has no known allergies.  Medications: Prior to Admission medications   Medication Sig Start Date End Date Taking? Authorizing Provider  acetaminophen (TYLENOL) 500 MG tablet Take 500 mg by mouth every 6 (six) hours as needed for headache.    [provider]  amLODipine (NORVASC) 5 MG tablet Take 5 mg by mouth every evening.    [provider]  B Complex-Folic Acid (B COMPLEX FORMULA 1, W/ FA,) TABS Take 1 tablet by mouth daily.  01/01/21   Brunetta Genera, MD  lidocaine-prilocaine (EMLA) cream Apply 1 application topically as needed. Patient taking differently: Apply 1 application topically as needed (access port). 12/14/20   Orson Slick, MD  losartan (COZAAR) 100 MG tablet Take 100 mg by mouth every evening. 11/01/20   [provider]  metFORMIN (GLUCOPHAGE) 500 MG tablet Take 1 tablet by mouth 2 (two) times daily. 08/22/19   [provider]  ondansetron (ZOFRAN) 8 MG tablet Take 1 tablet (8 mg total) by mouth every 8 (eight) hours as needed for nausea or vomiting. 12/14/20   Orson Slick, MD  pantoprazole (PROTONIX) 40 MG tablet Take 1 tablet by mouth every evening. 08/14/19   [provider]  prochlorperazine (COMPAZINE) 10 MG tablet Take 1 tablet (10 mg total) by mouth every 6 (six) hours as needed for nausea or vomiting. 12/14/20   Orson Slick, MD  rosuvastatin (CRESTOR) 5 MG tablet Take 5 mg by mouth every evening. 10/28/20   [provider]     Vital Signs:pending    Physical Exam: Awake, alert.  Chest clear to auscultation bilaterally.  Clean, intact right chest wall Port-A-Cath.  Heart with regular rate and rhythm.  Abdomen soft, positive bowel sounds, nontender.  No lower extremity edema.  Surgical scars noted right and left neck regions  Imaging: No results found.  Labs:  CBC: Recent Labs    01/18/21 0709 01/19/21 0506 01/29/21 0753 02/15/21 0858  WBC 2.0* 2.8* 3.0*  7.5  HGB 12.4* 13.6 13.8 12.8*  HCT 37.0* 39.1 40.7 38.4*  PLT 81* 109* 228 167    COAGS: Recent Labs    12/11/20 1003 01/17/21 1811  INR 1.1 1.1  APTT  --  27    BMP: Recent Labs    01/18/21 0709 01/19/21 0506 01/29/21 0753 02/15/21 0858  NA 134* 134* 136 138  K 3.4* 4.0 3.9 3.6  CL 101 100 104 105  CO2 23 26 23 23   GLUCOSE 119* 132* 133* 205*  BUN 11 12 11 11   CALCIUM 8.8* 9.3 9.3 9.1  CREATININE 0.80 0.78 0.90 0.90  GFRNONAA >60 >60 >60 >60    LIVER  FUNCTION TESTS: Recent Labs    01/15/21 0754 01/17/21 1811 01/29/21 0753 02/15/21 0858  BILITOT 0.5 0.7 0.3 0.3  AST 43* 36 48* 41  ALT 53* 48* 64* 46*  ALKPHOS 52 46 56 64  PROT 7.4 7.0 7.4 7.2  ALBUMIN 4.0 4.0 4.0 3.8    Assessment and Plan: Patient familiar to IR service from 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  He has a history of Hodgkin's lymphoma diagnosed in November of last year and recent PET scan revealing persistent metabolic activity within the nodule at the tail of the right parotid gland.  He presents today for image guided biopsy of this nodule for further evaluation.  He also has a history of prostate cancer which is under observation at this time. Risks and benefits of procedure was discussed with the patient  including, but not limited to bleeding, infection, damage to adjacent structures or low yield requiring additional tests.  All of the questions were answered and there is agreement to proceed.  Consent signed and in chart.     Electronically Signed: D. Rowe Robert, PA-C 02/22/2021, 12:01 PM   I spent a total of 25 minutes at the the patient's bedside AND on the patient's hospital floor or unit, greater than 50% of which was counseling/coordinating care for image guided right parotid nodule biopsy

## 2021-02-22 NOTE — Discharge Instructions (Signed)
Fentanyl injection What is this medicine? FENTANYL (FEN ta nil) is a pain reliever. It is used to treat pain before, during, and after surgery. This medicine is also used before, with, and in place of other medicines for sleep during a medical procedure. This medicine may be used for other purposes; ask your health care provider or pharmacist if you have questions. COMMON BRAND NAME(S): Sublimaze What should I tell my health care provider before I take this medicine? They need to know if you have any of these conditions:  brain tumor  breathing problems  drug abuse or addiction  head injury  kidney disease  liver disease  use of a MAOI like Carbex, Eldepryl, Marplan, Nardil, and Parnate in the past 14 days  an unusual or allergic reaction to fentanyl, other opioid analgesics, other medicines, foods, dyes, or preservatives  pregnant or trying to get pregnant  breast-feeding How should I use this medicine? This medicine is for injection into a vein or muscle. It is given by a health care professional in a hospital or clinic setting. Talk to your pediatrician regarding the use of this medicine in children. Special care may be needed. Overdosage: If you think you have taken too much of this medicine contact a poison control center or emergency room at once. NOTE: This medicine is only for you. Do not share this medicine with others. What if I miss a dose? This does not apply. What may interact with this medicine? Do not take this medication with any of the following medicines:  mifepristone This medicine may also interact with the following medications:  alcohol  antihistamines for allergy, cough and cold  antiviral medicines for HIV or AIDS  aprepitant  atropine  certain antibiotics like erythromycin and clarithromycin  certain medicines for anxiety or sleep  certain medicines for bladder problems like oxybutynin, tolterodine  certain medicines for blood pressure,  heart disease, irregular heart beat  certain medicines for depression like amitriptyline, fluoxetine, sertraline  certain medicines for diabetes like pioglitazone, troglitazone  certain medicines for fungal infections like ketoconazole and itraconazole  certain medicines for seizures like phenobarbital, phenytoin, primidone  certain medicines for stomach problems like dicyclomine, hyoscyamine  certain medicines for travel sickness like scopolamine  certain medicines for Parkinson's disease like benztropine, trihexyphenidyl  cimetidine  general anesthetics like halothane, isoflurane, methoxyflurane, propofol  grapefruit juice  ipratropium  local anesthetics like lidocaine, pramoxine, tetracaine  MAOIs like Carbex, Eldepryl, Marplan, Nardil, and Parnate  medicines that relax muscles for surgery  other narcotic medicines for pain or cough  phenothiazines like chlorpromazine, mesoridazine, prochlorperazine, thioridazine  rifampin  St. John's wort  steroid medicines like prednisone or cortisone This list may not describe all possible interactions. Give your health care provider a list of all the medicines, herbs, non-prescription drugs, or dietary supplements you use. Also tell them if you smoke, drink alcohol, or use illegal drugs. Some items may interact with your medicine. What should I watch for while using this medicine? Tell your health care provider if your pain does not go away, if it gets worse, or if you have new or a different type of pain. You may develop tolerance to this drug. Tolerance means that you will need a higher dose of the drug for pain relief. Tolerance is normal and is expected if you take this drug for a long time. Do not suddenly stop taking your drug because you may develop a severe reaction. Your body becomes used to the drug. This does  NOT mean you are addicted. Addiction is a behavior related to getting and using a drug for a nonmedical reason. If  you have pain, you have a medical reason to take pain drug. Your health care provider will tell you how much drug to take. If your health care provider wants you to stop the drug, the dose will be slowly lowered over time to avoid any side effects. If you take other drugs that also cause drowsiness like other narcotic pain drugs, benzodiazepines, or other drugs for sleep, you may have more side effects. Give your health care provider a list of all drugs you use. He or she will tell you how much drug to take. Do not take more drug than directed. Get emergency help right away if you have trouble breathing, unusually tired or sleepy, or not being able to respond or wake up. Talk to your health care provider about naloxone and how to get it. Naloxone is an emergency drug used for an opioid overdose. An overdose can happen if you take too much opioid. It can also happen if an opioid is taken with some other drugs or substances, like alcohol. Know the symptoms of an overdose, like trouble breathing, unusually tired or sleepy, or not being able to respond or wake up. Make sure to tell caregivers and close contacts where it is stored. Make sure they know how to use it. After naloxone is given, you must get emergency help right away. Naloxone is a temporary treatment. Repeat doses may be needed. You may get drowsy or dizzy. Do not drive, use machinery, or do anything that needs mental alertness until you know how this drug affects you. Do not stand up or sit up quickly, especially if you are an older patient. This reduces the risk of dizzy or fainting spells. Alcohol may interfere with the effect of this drug. Avoid alcoholic drinks. This drug will cause constipation. If you do not have a bowel movement for 3 days, call your health care provider. Your mouth may get dry. Chewing sugarless gum or sucking hard candy and drinking plenty of water may help. Contact your health care provider if the problem does not go away or  is severe. What side effects may I notice from receiving this medicine? Side effects that you should report to your doctor or health care professional as soon as possible:  allergic reactions like skin rash, itching or hives, swelling of the face, lips, or tongue  breathing problems  confusion  signs and symptoms of low blood pressure like dizziness; feeling faint or lightheaded, falls; unusually weak or tired  trouble passing urine or change in the amount of urine Side effects that usually do not require medical attention (report to your doctor or health care professional if they continue or are bothersome):  constipation  dry mouth  nausea, vomiting  tiredness This list may not describe all possible side effects. Call your doctor for medical advice about side effects. You may report side effects to FDA at 1-800-FDA-1088. Where should I keep my medicine? This drug is given in a hospital or clinic and will not be stored at home. NOTE: This sheet is a summary. It may not cover all possible information. If you have questions about this medicine, talk to your doctor, pharmacist, or health care provider.  2021 Elsevier/Gold Standard (2019-07-01 11:03:25)

## 2021-02-26 ENCOUNTER — Ambulatory Visit: Payer: BC Managed Care – PPO | Admitting: Hematology and Oncology

## 2021-02-26 ENCOUNTER — Other Ambulatory Visit: Payer: BC Managed Care – PPO

## 2021-02-26 ENCOUNTER — Ambulatory Visit: Payer: BC Managed Care – PPO

## 2021-03-01 ENCOUNTER — Inpatient Hospital Stay: Payer: BC Managed Care – PPO

## 2021-03-01 ENCOUNTER — Inpatient Hospital Stay (HOSPITAL_BASED_OUTPATIENT_CLINIC_OR_DEPARTMENT_OTHER): Payer: BC Managed Care – PPO | Admitting: Hematology and Oncology

## 2021-03-01 ENCOUNTER — Other Ambulatory Visit: Payer: Self-pay

## 2021-03-01 VITALS — BP 133/78 | HR 104 | Temp 97.8°F | Resp 16 | Ht 70.0 in | Wt 256.2 lb

## 2021-03-01 VITALS — HR 99

## 2021-03-01 DIAGNOSIS — C8121 Mixed cellularity classical Hodgkin lymphoma, lymph nodes of head, face, and neck: Secondary | ICD-10-CM

## 2021-03-01 DIAGNOSIS — Z95828 Presence of other vascular implants and grafts: Secondary | ICD-10-CM

## 2021-03-01 DIAGNOSIS — R5081 Fever presenting with conditions classified elsewhere: Secondary | ICD-10-CM | POA: Diagnosis not present

## 2021-03-01 DIAGNOSIS — D701 Agranulocytosis secondary to cancer chemotherapy: Secondary | ICD-10-CM | POA: Diagnosis not present

## 2021-03-01 DIAGNOSIS — I10 Essential (primary) hypertension: Secondary | ICD-10-CM | POA: Diagnosis not present

## 2021-03-01 DIAGNOSIS — Z1501 Genetic susceptibility to malignant neoplasm of breast: Secondary | ICD-10-CM | POA: Diagnosis not present

## 2021-03-01 DIAGNOSIS — Z8249 Family history of ischemic heart disease and other diseases of the circulatory system: Secondary | ICD-10-CM | POA: Diagnosis not present

## 2021-03-01 DIAGNOSIS — Z7984 Long term (current) use of oral hypoglycemic drugs: Secondary | ICD-10-CM | POA: Diagnosis not present

## 2021-03-01 DIAGNOSIS — T451X5A Adverse effect of antineoplastic and immunosuppressive drugs, initial encounter: Secondary | ICD-10-CM | POA: Diagnosis not present

## 2021-03-01 DIAGNOSIS — Z79899 Other long term (current) drug therapy: Secondary | ICD-10-CM | POA: Diagnosis not present

## 2021-03-01 DIAGNOSIS — Z87891 Personal history of nicotine dependence: Secondary | ICD-10-CM | POA: Diagnosis not present

## 2021-03-01 DIAGNOSIS — Z5111 Encounter for antineoplastic chemotherapy: Secondary | ICD-10-CM | POA: Diagnosis not present

## 2021-03-01 DIAGNOSIS — Z8546 Personal history of malignant neoplasm of prostate: Secondary | ICD-10-CM | POA: Diagnosis not present

## 2021-03-01 LAB — CBC WITH DIFFERENTIAL (CANCER CENTER ONLY)
Abs Immature Granulocytes: 0.01 10*3/uL (ref 0.00–0.07)
Basophils Absolute: 0.1 10*3/uL (ref 0.0–0.1)
Basophils Relative: 2 %
Eosinophils Absolute: 0.2 10*3/uL (ref 0.0–0.5)
Eosinophils Relative: 9 %
HCT: 37.8 % — ABNORMAL LOW (ref 39.0–52.0)
Hemoglobin: 12.6 g/dL — ABNORMAL LOW (ref 13.0–17.0)
Immature Granulocytes: 1 %
Lymphocytes Relative: 41 %
Lymphs Abs: 0.9 10*3/uL (ref 0.7–4.0)
MCH: 29.5 pg (ref 26.0–34.0)
MCHC: 33.3 g/dL (ref 30.0–36.0)
MCV: 88.5 fL (ref 80.0–100.0)
Monocytes Absolute: 0.5 10*3/uL (ref 0.1–1.0)
Monocytes Relative: 22 %
Neutro Abs: 0.5 10*3/uL — ABNORMAL LOW (ref 1.7–7.7)
Neutrophils Relative %: 25 %
Platelet Count: 248 10*3/uL (ref 150–400)
RBC: 4.27 MIL/uL (ref 4.22–5.81)
RDW: 15.5 % (ref 11.5–15.5)
WBC Count: 2.1 10*3/uL — ABNORMAL LOW (ref 4.0–10.5)
nRBC: 0 % (ref 0.0–0.2)

## 2021-03-01 LAB — CMP (CANCER CENTER ONLY)
ALT: 57 U/L — ABNORMAL HIGH (ref 0–44)
AST: 40 U/L (ref 15–41)
Albumin: 4.1 g/dL (ref 3.5–5.0)
Alkaline Phosphatase: 58 U/L (ref 38–126)
Anion gap: 12 (ref 5–15)
BUN: 12 mg/dL (ref 6–20)
CO2: 22 mmol/L (ref 22–32)
Calcium: 9.1 mg/dL (ref 8.9–10.3)
Chloride: 104 mmol/L (ref 98–111)
Creatinine: 0.83 mg/dL (ref 0.61–1.24)
GFR, Estimated: >60 mL/min (ref >60)
Glucose, Bld: 181 mg/dL — ABNORMAL HIGH (ref 70–99)
Potassium: 3.8 mmol/L (ref 3.5–5.1)
Sodium: 138 mmol/L (ref 135–145)
Total Bilirubin: 0.3 mg/dL (ref 0.3–1.2)
Total Protein: 7.4 g/dL (ref 6.5–8.1)

## 2021-03-01 LAB — LACTATE DEHYDROGENASE: LDH: 172 U/L (ref 98–192)

## 2021-03-01 MED ORDER — SODIUM CHLORIDE 0.9% FLUSH
10.0000 mL | INTRAVENOUS | Status: DC | PRN
  Administered 2021-03-01: 10 mL via INTRAVENOUS
  Filled 2021-03-01: qty 10

## 2021-03-01 MED ORDER — SODIUM CHLORIDE 0.9% FLUSH
10.0000 mL | INTRAVENOUS | Status: DC | PRN
  Administered 2021-03-01: 10 mL
  Filled 2021-03-01: qty 10

## 2021-03-01 MED ORDER — PALONOSETRON HCL INJECTION 0.25 MG/5ML
INTRAVENOUS | Status: AC
  Filled 2021-03-01: qty 5

## 2021-03-01 MED ORDER — SODIUM CHLORIDE 0.9 % IV SOLN
375.0000 mg/m2 | Freq: Once | INTRAVENOUS | Status: AC
  Administered 2021-03-01: 920 mg via INTRAVENOUS
  Filled 2021-03-01: qty 92

## 2021-03-01 MED ORDER — PALONOSETRON HCL INJECTION 0.25 MG/5ML
0.2500 mg | Freq: Once | INTRAVENOUS | Status: AC
  Administered 2021-03-01: 0.25 mg via INTRAVENOUS

## 2021-03-01 MED ORDER — DOXORUBICIN HCL CHEMO IV INJECTION 2 MG/ML
25.0000 mg/m2 | Freq: Once | INTRAVENOUS | Status: AC
  Administered 2021-03-01: 62 mg via INTRAVENOUS
  Filled 2021-03-01: qty 31

## 2021-03-01 MED ORDER — VINBLASTINE SULFATE CHEMO INJECTION 1 MG/ML
6.0000 mg/m2 | Freq: Once | INTRAVENOUS | Status: AC
  Administered 2021-03-01: 14.6 mg via INTRAVENOUS
  Filled 2021-03-01: qty 14.6

## 2021-03-01 MED ORDER — DEXAMETHASONE SODIUM PHOSPHATE 100 MG/10ML IJ SOLN
10.0000 mg | Freq: Once | INTRAMUSCULAR | Status: AC
  Administered 2021-03-01: 10 mg via INTRAVENOUS
  Filled 2021-03-01: qty 10

## 2021-03-01 MED ORDER — HEPARIN SOD (PORK) LOCK FLUSH 100 UNIT/ML IV SOLN
500.0000 [IU] | Freq: Once | INTRAVENOUS | Status: AC | PRN
Start: 2021-03-01 — End: 2021-03-01
  Administered 2021-03-01: 500 [IU]
  Filled 2021-03-01: qty 5

## 2021-03-01 MED ORDER — SODIUM CHLORIDE 0.9 % IV SOLN
Freq: Once | INTRAVENOUS | Status: AC
  Filled 2021-03-01: qty 250

## 2021-03-01 MED ORDER — SODIUM CHLORIDE 0.9 % IV SOLN
150.0000 mg | Freq: Once | INTRAVENOUS | Status: AC
  Administered 2021-03-01: 150 mg via INTRAVENOUS
  Filled 2021-03-01: qty 150

## 2021-03-01 NOTE — Progress Notes (Signed)
Pt's short term disability form signed per Dr. Lorenso Courier and fax'd to [employer]

## 2021-03-01 NOTE — Patient Instructions (Signed)
[facility] Discharge Instructions for Patients Receiving Chemotherapy  Today you received the following chemotherapy agents: Adriamycin, Vinrelbine, DTIC  To help prevent nausea and vomiting after your treatment, we encourage you to take your nausea medication as directed.   If you develop nausea and vomiting that is not controlled by your nausea medication, call the clinic.   BELOW ARE SYMPTOMS THAT SHOULD BE REPORTED IMMEDIATELY:  *FEVER GREATER THAN 100.5 F  *CHILLS WITH OR WITHOUT FEVER  NAUSEA AND VOMITING THAT IS NOT CONTROLLED WITH YOUR NAUSEA MEDICATION  *UNUSUAL SHORTNESS OF BREATH  *UNUSUAL BRUISING OR BLEEDING  TENDERNESS IN MOUTH AND THROAT WITH OR WITHOUT PRESENCE OF ULCERS  *URINARY PROBLEMS  *BOWEL PROBLEMS  UNUSUAL RASH Items with * indicate a potential emergency and should be followed up as soon as possible.  Feel free to call the clinic should you have any questions or concerns. The clinic phone number is [phone].  Please show the [facility] at check-in to the Emergency Department and triage nurse.

## 2021-03-01 NOTE — Progress Notes (Signed)
[facility] [phone]   [phone]  PROGRESS NOTE  Patient Care Team: London Pepper, MD as PCP - General (Family Medicine)  Hematological/Oncological History # Classical Hodgkin's Lymphoma, Mixed Cellularity. 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 2) 10/27/2020: resection of neck mass. Pathology showed classical Hodgkin's lymphoma, mixed cellularity subtype 3) 11/13/2020: establish care with Dr. Lorenso Courier  4) 11/26/2020: PET CT scan shows single hypermetabolic unenlarged right level II cervical lymph node identified on today's study. FDG uptake is compatible with [place] 4. Other small bilateral cervical and upper normal to borderline hepatoduodenal ligament lymph nodes show Deauville 2 uptake levels 5) 12/18/2020: Cycle 1 Day 1 of AVD chemotherapy  6) 01/15/2021: Cycle 2 Day 1 of AVD chemotherapy  7) 02/12/2021: PET CT scan shows no evidence of residual disease 8) 02/15/2021: Cycle 3 Day 1 of AVD chemotherapy  9) 03/01/2021: Cycle 3 Day 15 of AVD chemotherapy   Interval History:  Korbyn Chopin 60 y.o. male with medical history significant for Classical Hodgkin's lymphoma Early Stage/Unfavorable risk who presents for a follow up visit. The patient's last visit was on 02/15/2021. In the interim since the last visit he has continued to tolerate treatment well.   On exam today Mr. Hijazi reports he continues to tolerate treatment well overall.  He notes that normally he has about 1 week where he has fatigue and muscle aches, however during this last cycle this persisted for the full 2 weeks.  He notes his appetite has been good and he has been "eating like a horse".  He notes that he does tend to eat a late lunch and no supper but does feel like he eats well when he does eat.  He denies having any issues with nausea, vomiting, or diarrhea.  He does continue to have fatigue  and muscle pain.  He does not sleep well, but he knows that this is always been a problem.  Patient denies any fevers, chills, night sweats, shortness of breath, chest pain or cough.  He has no other complaints. A full 10 point ROS is listed below.  MEDICAL HISTORY:  Past Medical History:  Diagnosis Date  . Cancer ([place]) 10/15   recent dx prostate ca-no tx yet  . GERD (gastroesophageal reflux disease)   . Hypertension   . Pneumonia    history  . Teeth decayed     SURGICAL HISTORY: Past Surgical History:  Procedure Laterality Date  . IR IMAGING GUIDED PORT INSERTION  12/11/2020  . MASS BIOPSY Left 09/29/2014   Procedure: LEFT LOWER NECK MASS EXCISION;  Surgeon: Izora Gala, MD;  Location: [facility];  Service: ENT;  Laterality: Left;  . PAROTIDECTOMY Left 09/29/2014   Procedure: LEFT PAROTIDECTOMY;  Surgeon: Izora Gala, MD;  Location: [facility];  Service: ENT;  Laterality: Left;  . SEPTOPLASTY  1976    SOCIAL HISTORY: Social History   Socioeconomic History  . Marital status: Widowed    Spouse name: Not on file  . Number of children: Not on file  . Years of education: Not on file  . Highest education level: Not on file  Occupational History  . Not on file  Tobacco Use  . Smoking status: Former Smoker    Packs/day: 3.00    Years: 32.00    Pack years: 96.00    Types: Cigarettes    Quit date: 11/26/2017    Years since  quitting: 3.2  . Smokeless tobacco: Never Used  Vaping Use  . Vaping Use: Never used  Substance and Sexual Activity  . Alcohol use: Yes    Comment: twice a year  . Drug use: No  . Sexual activity: Not on file  Other Topics Concern  . Not on file  Social History Narrative  . Not on file   Social Determinants of Health   Financial Resource Strain: Not on file  Food Insecurity: Not on file  Transportation Needs: Not on file  Physical Activity: Not on file  Stress: Not on file  Social Connections: Not on file  Intimate  Partner Violence: Not on file    FAMILY HISTORY: Family History  Problem Relation Age of Onset  . Stroke Mother   . Heart disease Father     ALLERGIES:  has No Known Allergies.  MEDICATIONS:  Current Outpatient Medications  Medication Sig Dispense Refill  . acetaminophen (TYLENOL) 500 MG tablet Take 500 mg by mouth every 6 (six) hours as needed for headache.    Marland Kitchen amLODipine (NORVASC) 5 MG tablet Take 5 mg by mouth every evening.    . B Complex-Folic Acid (B COMPLEX FORMULA 1, W/ FA,) TABS Take 1 tablet by mouth daily. 30 tablet 3  . lidocaine-prilocaine (EMLA) cream Apply 1 application topically as needed. (Patient taking differently: Apply 1 application topically as needed (access port).) 30 g 0  . losartan (COZAAR) 100 MG tablet Take 100 mg by mouth every evening.    . metFORMIN (GLUCOPHAGE) 500 MG tablet Take 1 tablet by mouth 2 (two) times daily.    . ondansetron (ZOFRAN) 8 MG tablet Take 1 tablet (8 mg total) by mouth every 8 (eight) hours as needed for nausea or vomiting. 39 tablet 0  . pantoprazole (PROTONIX) 40 MG tablet Take 1 tablet by mouth every evening.    . prochlorperazine (COMPAZINE) 10 MG tablet Take 1 tablet (10 mg total) by mouth every 6 (six) hours as needed for nausea or vomiting. 30 tablet 0  . rosuvastatin (CRESTOR) 5 MG tablet Take 5 mg by mouth every evening.     No current facility-administered medications for this visit.   Facility-Administered Medications Ordered in Other Visits  Medication Dose Route Frequency Provider Last Rate Last Admin  . 0.9 %  sodium chloride infusion   Intravenous Continuous Allred, Darrell K, PA-C        REVIEW OF SYSTEMS:   Constitutional: ( - ) fevers, ( - )  chills , ( - ) night sweats Eyes: ( - ) blurriness of vision, ( - ) double vision, ( - ) watery eyes Ears, nose, mouth, throat, and face: ( - ) mucositis, ( - ) sore throat Respiratory: ( - ) cough, ( - ) dyspnea, ( - ) wheezes Cardiovascular: ( - ) palpitation, ( - )  chest discomfort, ( - ) lower extremity swelling Gastrointestinal:  ( - ) nausea, ( - ) heartburn, ( - ) change in bowel habits Skin: ( - ) abnormal skin rashes Lymphatics: ( - ) new lymphadenopathy, ( - ) easy bruising Neurological: ( - ) numbness, ( - ) tingling, ( - ) new weaknesses Behavioral/Psych: ( - ) mood change, ( - ) new changes  All other systems were reviewed with the patient and are negative.  PHYSICAL EXAMINATION: ECOG PERFORMANCE STATUS: 1 - Symptomatic but completely ambulatory  Vitals:   03/01/21 1102  BP: 133/78  Pulse: (!) 104  Resp: 16  Temp:  97.8 F (36.6 C)  SpO2: 99%   Filed Weights   03/01/21 1102  Weight: 256 lb 3.2 oz (116.2 kg)    GENERAL: well appearing middle aged Caucasian male in NAD ORAL: No erythema or purulent discharge noted.  SKIN: skin color, texture, turgor are normal, no rashes or significant lesions EYES: conjunctiva are pink and non-injected, sclera clear NECK: supple, non-tender LYMPH:  Prior surgical scars on right neck, some palpable tissue (unclear if residual node or scar tissue) no palpable lymphadenopathy in the cervical, axillary or supraclavicular lymph nodes.  LUNGS: clear to auscultation and percussion with normal breathing effort HEART: regular rate & rhythm and no murmurs and no lower extremity edema Musculoskeletal: no cyanosis of digits and no clubbing  PSYCH: alert & oriented x 3, fluent speech NEURO: no focal motor/sensory deficits  LABORATORY DATA:  I have reviewed the data as listed CBC Latest Ref Rng & Units 03/01/2021 02/22/2021 02/15/2021  WBC 4.0 - 10.5 K/uL 2.1(L) 3.2(L) 7.5  Hemoglobin 13.0 - 17.0 g/dL 12.6(L) 11.9(L) 12.8(L)  Hematocrit 39.0 - 52.0 % 37.8(L) 35.4(L) 38.4(L)  Platelets 150 - 400 K/uL 248 207 167    CMP Latest Ref Rng & Units 03/01/2021 02/15/2021 01/29/2021  Glucose 70 - 99 mg/dL 181(H) 205(H) 133(H)  BUN 6 - 20 mg/dL '12 11 11  ' Creatinine 0.61 - 1.24 mg/dL 0.83 0.90 0.90  Sodium 135 - 145  mmol/L 138 138 136  Potassium 3.5 - 5.1 mmol/L 3.8 3.6 3.9  Chloride 98 - 111 mmol/L 104 105 104  CO2 22 - 32 mmol/L '22 23 23  ' Calcium 8.9 - 10.3 mg/dL 9.1 9.1 9.3  Total Protein 6.5 - 8.1 g/dL 7.4 7.2 7.4  Total Bilirubin 0.3 - 1.2 mg/dL 0.3 0.3 0.3  Alkaline Phos 38 - 126 U/L 58 64 56  AST 15 - 41 U/L 40 41 48(H)  ALT 0 - 44 U/L 57(H) 46(H) 64(H)    No results found for: MPROTEIN No results found for: KPAFRELGTCHN, LAMBDASER, KAPLAMBRATIO   RADIOGRAPHIC STUDIES:  US SOFT TISSUE HEAD & NECK (NON-THYROID)  Result Date: 02/22/2021 CLINICAL DATA:  Subcentimeter PET positive right parotid nodule, assess for biopsy EXAM: ULTRASOUND OF HEAD/NECK SOFT TISSUES TECHNIQUE: Ultrasound examination of the head and neck soft tissues was performed in the area of clinical concern. COMPARISON:  02/12/2021 PET-CT FINDINGS: Ultrasound performed of the right parotid gland in preparation for possible biopsy. By ultrasound, the right parotid gland demonstrates 2 adjacent small subcentimeter benign-appearing lymph nodes with thin hypoechoic cortex and fatty hila. These appear to correlate with the PET-CT finding. No other suspicious parotid lesion or abnormality by ultrasound that warrants biopsy. Procedure not performed. IMPRESSION: Two adjacent right parotid subcentimeter benign-appearing lymph nodes appear to correlate with the PET-CT finding. Biopsy not performed. Electronically Signed   By: Jerilynn Mages.  Shick M.D.   On: 02/22/2021 14:02   NM PET Image Restag (PS) Skull Base To Thigh  Result Date: 02/12/2021 CLINICAL DATA:  Subsequent treatment strategy for mixed cellularity Hodgkin's lymphoma. EXAM: NUCLEAR MEDICINE PET SKULL BASE TO THIGH TECHNIQUE: 12.8 mCi F-18 FDG was injected intravenously. Full-ring PET imaging was performed from the skull base to thigh after the radiotracer. CT data was obtained and used for attenuation correction and anatomic localization. Fasting blood glucose: 154 mg/dl COMPARISON:   11/26/2020 FINDINGS: Mediastinal blood pool activity: SUV max 2.1 Liver activity: SUV max 3.1 NECK: Hypermetabolic nodule in the RIGHT neck along the inferior margin of the RIGHT parotid gland measures 6 mm  short axis and has associated metabolic activity with SUV max equal 3.7. There is misregistration between the PET imaging and the CT imaging. This lesion is not changed in size from comparison CT scan with the lesion had SUV max equal 4.6. More remote scan from 2015 also showed a similar node/nodule at this level. 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 No hypermetabolic nodes in the neck. Incidental CT findings: none CHEST: No hypermetabolic mediastinal or hilar nodes. No suspicious pulmonary nodules on the CT scan. Incidental CT findings: none ABDOMEN/PELVIS: No significant metabolic activity associated with small periportal upper abdominal lymph nodes. Spleen is normal volume and normal metabolic activity. No abdominopelvic adenopathy. Incidental CT findings: Atherosclerotic calcification of the aorta. SKELETON: There is diffuse mild metabolic activity within the marrow space. Incidental CT findings: none IMPRESSION: 1. No evidence of lymphoma on skull base to thigh FDG PET scan. 2. Persistent metabolic activity within a nodule at the tail of the RIGHT parotid gland. Favor primary parotid neoplasm. 3. Mild uniform activity in the marrow space is favored related to anemia or drug response. Electronically Signed   By: Suzy Bouchard M.D.   On: 02/12/2021 17:19    ASSESSMENT & PLAN [name] 60 y.o. male with medical history significant for Classical Hodgkin's lymphoma Early Stage/Unfavorable risk who presents for a follow up visit.  After review the labs, the records, review of the PFTs, and review the echocardiogram the findings are most consistent with an early stage/unfavorable risk classical Hodgkin's lymphoma.  Given that he meets unfavorable criteria via multiple guidelines I  recommended that we proceed with treatment as recommended in the NCCN guidelines for patients with early stage unfavorable disease (which is the exact same treatment recommended for stage III-IV disease).  Previously we discussed the AVD chemotherapy regimen.  We discussed the expected side effects including possible hair loss, nausea, vomiting, diarrhea, constipation, cytopenias, neutropenia, cardiac dysfunction, and neurological damage.  The patient will be treated with every 2 week rounds of chemotherapy using this regimen. AVD chemotherapy consists of doxorubicin 25 mg/m on days 1 and 15, vinblastine 6 mg per metered squared IV on days 1 and 15, and dacarbazine 375 mg/m IV on days 1 and 15.  After 2 cycles of AVD chemotherapy, PET/CT scan from 02/12/2021 revealed complete response to therapy with no evidence of lymphoma. The recommendation is to proceed with a further 4 cycles of AVD therapy.   # Classical Hodgkin's Lymphoma, Mixed Cellularity. 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 There are some smaller lymph nodes in the neck and one near the hepatoduodenal ligament, though would favor diagnosis of early stage --based on these criteria the recommendation would be for AVD x 2 cycles with interval PET CT scan followed by AVD x 4 cycles.  --interval PET on 02/12/2021 showed complete response to therapy --today is Cycle 3 Day 15 of AVD chemotherapy.  --will avoid bleomycin in this patient due to smoking history, mild abnormalities on PFTs and age --RTC for Cycle 4 Day 1 of treatment in 2 weeks   #Neutropenia, Severe --expected with this chemotherapy regimen. The use of GCSF with this chemotherapy is not routinely recommended as these patients rarely get neutropenic  fever despite low ANCs --however, this patient had an episode of febrile neutropenia.  Will pursue GCSF therapy following his treatment.   #Supportive Care --chemotherapy education to be scheduled  --zofran 44m q8H PRN and compazine 17mPO q6H for nausea -- EMLA cream for port -- no pain medication required at this time.   #Gum soreness: --uncertain etiology but physical exam did no reveal any gross evidence of infection. --Recommended a follow up with dentist for further workup.   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 No biopsy recommended.  --continue to monitor on future scans.  No orders of the defined types were placed in this encounter.  All questions were answered. The patient knows to call the clinic with any problems, questions or concerns.  A total of more than 30 minutes were spent on this encounter and over half of that time was spent on counseling and coordination of care as outlined above.   JoLedell PeoplesMD Department of Hematology/Oncology [facility] [place]: [phone]: [phone]: [email or web address]  03/02/2021 10:23 AM

## 2021-03-01 NOTE — Patient Instructions (Signed)

## 2021-03-01 NOTE — Progress Notes (Signed)
Okay to treat with ANC 0.5 per Dr. Lorenso Courier

## 2021-03-03 ENCOUNTER — Inpatient Hospital Stay: Payer: BC Managed Care – PPO

## 2021-03-03 ENCOUNTER — Other Ambulatory Visit: Payer: Self-pay

## 2021-03-03 VITALS — BP 139/79 | HR 106 | Resp 18

## 2021-03-03 DIAGNOSIS — Z5111 Encounter for antineoplastic chemotherapy: Secondary | ICD-10-CM | POA: Diagnosis not present

## 2021-03-03 DIAGNOSIS — I10 Essential (primary) hypertension: Secondary | ICD-10-CM | POA: Diagnosis not present

## 2021-03-03 DIAGNOSIS — R5081 Fever presenting with conditions classified elsewhere: Secondary | ICD-10-CM | POA: Diagnosis not present

## 2021-03-03 DIAGNOSIS — C8121 Mixed cellularity classical Hodgkin lymphoma, lymph nodes of head, face, and neck: Secondary | ICD-10-CM

## 2021-03-03 DIAGNOSIS — T451X5A Adverse effect of antineoplastic and immunosuppressive drugs, initial encounter: Secondary | ICD-10-CM | POA: Diagnosis not present

## 2021-03-03 DIAGNOSIS — Z87891 Personal history of nicotine dependence: Secondary | ICD-10-CM | POA: Diagnosis not present

## 2021-03-03 DIAGNOSIS — Z79899 Other long term (current) drug therapy: Secondary | ICD-10-CM | POA: Diagnosis not present

## 2021-03-03 DIAGNOSIS — D701 Agranulocytosis secondary to cancer chemotherapy: Secondary | ICD-10-CM | POA: Diagnosis not present

## 2021-03-03 DIAGNOSIS — Z1501 Genetic susceptibility to malignant neoplasm of breast: Secondary | ICD-10-CM | POA: Diagnosis not present

## 2021-03-03 DIAGNOSIS — Z7984 Long term (current) use of oral hypoglycemic drugs: Secondary | ICD-10-CM | POA: Diagnosis not present

## 2021-03-03 DIAGNOSIS — Z8249 Family history of ischemic heart disease and other diseases of the circulatory system: Secondary | ICD-10-CM | POA: Diagnosis not present

## 2021-03-03 DIAGNOSIS — Z8546 Personal history of malignant neoplasm of prostate: Secondary | ICD-10-CM | POA: Diagnosis not present

## 2021-03-03 MED ORDER — PEGFILGRASTIM-CBQV 6 MG/0.6ML ~~LOC~~ SOSY
6.0000 mg | PREFILLED_SYRINGE | Freq: Once | SUBCUTANEOUS | Status: AC
  Administered 2021-03-03: 6 mg via SUBCUTANEOUS

## 2021-03-03 MED ORDER — PEGFILGRASTIM-CBQV 6 MG/0.6ML ~~LOC~~ SOSY
PREFILLED_SYRINGE | SUBCUTANEOUS | Status: AC
  Filled 2021-03-03: qty 0.6

## 2021-03-03 NOTE — Patient Instructions (Signed)
Pegfilgrastim injection What is this medicine? PEGFILGRASTIM (PEG fil gra stim) is a long-acting granulocyte colony-stimulating factor that stimulates the growth of neutrophils, a type of white blood cell important in the body's fight against infection. It is used to reduce the incidence of fever and infection in patients with certain types of cancer who are receiving chemotherapy that affects the bone marrow, and to increase survival after being exposed to high doses of radiation. This medicine may be used for other purposes; ask your health care provider or pharmacist if you have questions. COMMON BRAND NAME(S): Fulphila, Neulasta, Nyvepria, UDENYCA, Ziextenzo What should I tell my health care provider before I take this medicine? They need to know if you have any of these conditions:  kidney disease  latex allergy  ongoing radiation therapy  sickle cell disease  skin reactions to acrylic adhesives (On-Body Injector only)  an unusual or allergic reaction to pegfilgrastim, filgrastim, other medicines, foods, dyes, or preservatives  pregnant or trying to get pregnant  breast-feeding How should I use this medicine? This medicine is for injection under the skin. If you get this medicine at home, you will be taught how to prepare and give the pre-filled syringe or how to use the On-body Injector. Refer to the patient Instructions for Use for detailed instructions. Use exactly as directed. Tell your healthcare provider immediately if you suspect that the On-body Injector may not have performed as intended or if you suspect the use of the On-body Injector resulted in a missed or partial dose. It is important that you put your used needles and syringes in a special sharps container. Do not put them in a trash can. If you do not have a sharps container, call your pharmacist or healthcare provider to get one. Talk to your pediatrician regarding the use of this medicine in children. While this drug  may be prescribed for selected conditions, precautions do apply. Overdosage: If you think you have taken too much of this medicine contact a poison control center or emergency room at once. NOTE: This medicine is only for you. Do not share this medicine with others. What if I miss a dose? It is important not to miss your dose. Call your doctor or health care professional if you miss your dose. If you miss a dose due to an On-body Injector failure or leakage, a new dose should be administered as soon as possible using a single prefilled syringe for manual use. What may interact with this medicine? Interactions have not been studied. This list may not describe all possible interactions. Give your health care provider a list of all the medicines, herbs, non-prescription drugs, or dietary supplements you use. Also tell them if you smoke, drink alcohol, or use illegal drugs. Some items may interact with your medicine. What should I watch for while using this medicine? Your condition will be monitored carefully while you are receiving this medicine. You may need blood work done while you are taking this medicine. Talk to your health care provider about your risk of cancer. You may be more at risk for certain types of cancer if you take this medicine. If you are going to need a MRI, CT scan, or other procedure, tell your doctor that you are using this medicine (On-Body Injector only). What side effects may I notice from receiving this medicine? Side effects that you should report to your doctor or health care professional as soon as possible:  allergic reactions (skin rash, itching or hives, swelling of   the face, lips, or tongue)  back pain  dizziness  fever  pain, redness, or irritation at site where injected  pinpoint red spots on the skin  red or dark-brown urine  shortness of breath or breathing problems  stomach or side pain, or pain at the shoulder  swelling  tiredness  trouble  passing urine or change in the amount of urine  unusual bruising or bleeding Side effects that usually do not require medical attention (report to your doctor or health care professional if they continue or are bothersome):  bone pain  muscle pain This list may not describe all possible side effects. Call your doctor for medical advice about side effects. You may report side effects to FDA at 1-800-FDA-1088. Where should I keep my medicine? Keep out of the reach of children. If you are using this medicine at home, you will be instructed on how to store it. Throw away any unused medicine after the expiration date on the label. NOTE: This sheet is a summary. It may not cover all possible information. If you have questions about this medicine, talk to your doctor, pharmacist, or health care provider.  2021 Elsevier/Gold Standard (2019-12-13 13:20:51)  

## 2021-03-15 ENCOUNTER — Encounter: Payer: Self-pay | Admitting: Hematology and Oncology

## 2021-03-15 ENCOUNTER — Other Ambulatory Visit: Payer: Self-pay

## 2021-03-15 ENCOUNTER — Inpatient Hospital Stay: Payer: BC Managed Care – PPO

## 2021-03-15 ENCOUNTER — Inpatient Hospital Stay: Payer: BC Managed Care – PPO | Attending: Hematology and Oncology | Admitting: Hematology and Oncology

## 2021-03-15 VITALS — BP 134/73 | HR 101 | Temp 97.7°F | Resp 17 | Ht 70.0 in | Wt 254.8 lb

## 2021-03-15 VITALS — HR 99

## 2021-03-15 DIAGNOSIS — Z95828 Presence of other vascular implants and grafts: Secondary | ICD-10-CM

## 2021-03-15 DIAGNOSIS — Z8249 Family history of ischemic heart disease and other diseases of the circulatory system: Secondary | ICD-10-CM | POA: Diagnosis not present

## 2021-03-15 DIAGNOSIS — Z1501 Genetic susceptibility to malignant neoplasm of breast: Secondary | ICD-10-CM | POA: Insufficient documentation

## 2021-03-15 DIAGNOSIS — C8121 Mixed cellularity classical Hodgkin lymphoma, lymph nodes of head, face, and neck: Secondary | ICD-10-CM

## 2021-03-15 DIAGNOSIS — I10 Essential (primary) hypertension: Secondary | ICD-10-CM | POA: Diagnosis not present

## 2021-03-15 DIAGNOSIS — Z8546 Personal history of malignant neoplasm of prostate: Secondary | ICD-10-CM | POA: Insufficient documentation

## 2021-03-15 DIAGNOSIS — R5081 Fever presenting with conditions classified elsewhere: Secondary | ICD-10-CM | POA: Insufficient documentation

## 2021-03-15 DIAGNOSIS — Z87891 Personal history of nicotine dependence: Secondary | ICD-10-CM | POA: Insufficient documentation

## 2021-03-15 DIAGNOSIS — D701 Agranulocytosis secondary to cancer chemotherapy: Secondary | ICD-10-CM | POA: Diagnosis not present

## 2021-03-15 DIAGNOSIS — Z7984 Long term (current) use of oral hypoglycemic drugs: Secondary | ICD-10-CM | POA: Diagnosis not present

## 2021-03-15 DIAGNOSIS — Z5111 Encounter for antineoplastic chemotherapy: Secondary | ICD-10-CM | POA: Insufficient documentation

## 2021-03-15 DIAGNOSIS — Z79899 Other long term (current) drug therapy: Secondary | ICD-10-CM | POA: Insufficient documentation

## 2021-03-15 DIAGNOSIS — T451X5D Adverse effect of antineoplastic and immunosuppressive drugs, subsequent encounter: Secondary | ICD-10-CM | POA: Insufficient documentation

## 2021-03-15 LAB — CMP (CANCER CENTER ONLY)
ALT: 45 U/L — ABNORMAL HIGH (ref 0–44)
AST: 37 U/L (ref 15–41)
Albumin: 3.9 g/dL (ref 3.5–5.0)
Alkaline Phosphatase: 79 U/L (ref 38–126)
Anion gap: 12 (ref 5–15)
BUN: 9 mg/dL (ref 6–20)
CO2: 23 mmol/L (ref 22–32)
Calcium: 9.3 mg/dL (ref 8.9–10.3)
Chloride: 104 mmol/L (ref 98–111)
Creatinine: 0.85 mg/dL (ref 0.61–1.24)
GFR, Estimated: >60 mL/min (ref >60)
Glucose, Bld: 194 mg/dL — ABNORMAL HIGH (ref 70–99)
Potassium: 3.7 mmol/L (ref 3.5–5.1)
Sodium: 139 mmol/L (ref 135–145)
Total Bilirubin: 0.3 mg/dL (ref 0.3–1.2)
Total Protein: 7.3 g/dL (ref 6.5–8.1)

## 2021-03-15 LAB — CBC WITH DIFFERENTIAL (CANCER CENTER ONLY)
Abs Immature Granulocytes: 0.43 10*3/uL — ABNORMAL HIGH (ref 0.00–0.07)
Basophils Absolute: 0.1 10*3/uL (ref 0.0–0.1)
Basophils Relative: 1 %
Eosinophils Absolute: 0.1 10*3/uL (ref 0.0–0.5)
Eosinophils Relative: 1 %
HCT: 38.6 % — ABNORMAL LOW (ref 39.0–52.0)
Hemoglobin: 12.9 g/dL — ABNORMAL LOW (ref 13.0–17.0)
Immature Granulocytes: 5 %
Lymphocytes Relative: 14 %
Lymphs Abs: 1.3 10*3/uL (ref 0.7–4.0)
MCH: 29.7 pg (ref 26.0–34.0)
MCHC: 33.4 g/dL (ref 30.0–36.0)
MCV: 88.7 fL (ref 80.0–100.0)
Monocytes Absolute: 0.7 10*3/uL (ref 0.1–1.0)
Monocytes Relative: 7 %
Neutro Abs: 7 10*3/uL (ref 1.7–7.7)
Neutrophils Relative %: 72 %
Platelet Count: 134 10*3/uL — ABNORMAL LOW (ref 150–400)
RBC: 4.35 MIL/uL (ref 4.22–5.81)
RDW: 15.9 % — ABNORMAL HIGH (ref 11.5–15.5)
WBC Count: 9.6 10*3/uL (ref 4.0–10.5)
nRBC: 0 % (ref 0.0–0.2)

## 2021-03-15 LAB — LACTATE DEHYDROGENASE: LDH: 211 U/L — ABNORMAL HIGH (ref 98–192)

## 2021-03-15 MED ORDER — SODIUM CHLORIDE 0.9 % IV SOLN
375.0000 mg/m2 | Freq: Once | INTRAVENOUS | Status: AC
  Administered 2021-03-15: 920 mg via INTRAVENOUS
  Filled 2021-03-15: qty 92

## 2021-03-15 MED ORDER — PALONOSETRON HCL INJECTION 0.25 MG/5ML
INTRAVENOUS | Status: AC
  Filled 2021-03-15: qty 5

## 2021-03-15 MED ORDER — DOXORUBICIN HCL CHEMO IV INJECTION 2 MG/ML
25.0000 mg/m2 | Freq: Once | INTRAVENOUS | Status: AC
  Administered 2021-03-15: 62 mg via INTRAVENOUS
  Filled 2021-03-15: qty 31

## 2021-03-15 MED ORDER — HEPARIN SOD (PORK) LOCK FLUSH 100 UNIT/ML IV SOLN
500.0000 [IU] | Freq: Once | INTRAVENOUS | Status: AC | PRN
Start: 2021-03-15 — End: 2021-03-15
  Administered 2021-03-15: 500 [IU]
  Filled 2021-03-15: qty 5

## 2021-03-15 MED ORDER — VINBLASTINE SULFATE CHEMO INJECTION 1 MG/ML
6.0000 mg/m2 | Freq: Once | INTRAVENOUS | Status: AC
  Administered 2021-03-15: 14.6 mg via INTRAVENOUS
  Filled 2021-03-15: qty 14.6

## 2021-03-15 MED ORDER — PALONOSETRON HCL INJECTION 0.25 MG/5ML
0.2500 mg | Freq: Once | INTRAVENOUS | Status: AC
Start: 2021-03-15 — End: 2021-03-15
  Administered 2021-03-15: 0.25 mg via INTRAVENOUS

## 2021-03-15 MED ORDER — SODIUM CHLORIDE 0.9% FLUSH
10.0000 mL | INTRAVENOUS | Status: DC | PRN
  Administered 2021-03-15: 10 mL via INTRAVENOUS
  Filled 2021-03-15: qty 10

## 2021-03-15 MED ORDER — SODIUM CHLORIDE 0.9% FLUSH
10.0000 mL | INTRAVENOUS | Status: DC | PRN
  Administered 2021-03-15: 10 mL
  Filled 2021-03-15: qty 10

## 2021-03-15 MED ORDER — SODIUM CHLORIDE 0.9 % IV SOLN
Freq: Once | INTRAVENOUS | Status: AC
  Filled 2021-03-15: qty 250

## 2021-03-15 MED ORDER — SODIUM CHLORIDE 0.9 % IV SOLN
150.0000 mg | Freq: Once | INTRAVENOUS | Status: AC
  Administered 2021-03-15: 150 mg via INTRAVENOUS
  Filled 2021-03-15: qty 150

## 2021-03-15 MED ORDER — SODIUM CHLORIDE 0.9 % IV SOLN
10.0000 mg | Freq: Once | INTRAVENOUS | Status: AC
  Administered 2021-03-15: 10 mg via INTRAVENOUS
  Filled 2021-03-15: qty 10

## 2021-03-15 NOTE — Patient Instructions (Signed)
[facility] Discharge Instructions for Patients Receiving Chemotherapy  Today you received the following chemotherapy agents: Adriamycin, Vinrelbine, DTIC  To help prevent nausea and vomiting after your treatment, we encourage you to take your nausea medication as directed.   If you develop nausea and vomiting that is not controlled by your nausea medication, call the clinic.   BELOW ARE SYMPTOMS THAT SHOULD BE REPORTED IMMEDIATELY:  *FEVER GREATER THAN 100.5 F  *CHILLS WITH OR WITHOUT FEVER  NAUSEA AND VOMITING THAT IS NOT CONTROLLED WITH YOUR NAUSEA MEDICATION  *UNUSUAL SHORTNESS OF BREATH  *UNUSUAL BRUISING OR BLEEDING  TENDERNESS IN MOUTH AND THROAT WITH OR WITHOUT PRESENCE OF ULCERS  *URINARY PROBLEMS  *BOWEL PROBLEMS  UNUSUAL RASH Items with * indicate a potential emergency and should be followed up as soon as possible.  Feel free to call the clinic should you have any questions or concerns. The clinic phone number is [phone].  Please show the [facility] at check-in to the Emergency Department and triage nurse.

## 2021-03-15 NOTE — Patient Instructions (Signed)
Implanted Port Insertion, Care After This sheet gives you information about how to care for yourself after your procedure. Your health care provider may also give you more specific instructions. If you have problems or questions, contact your health care provider. What can I expect after the procedure? After the procedure, it is common to have:  Discomfort at the port insertion site.  Bruising on the skin over the port. This should improve over 3-4 days. Follow these instructions at home: Port care  After your port is placed, you will get a manufacturer's information card. The card has information about your port. Keep this card with you at all times.  Take care of the port as told by your health care provider. Ask your health care provider if you or a family member can get training for taking care of the port at home. A home health care nurse may also take care of the port.  Make sure to remember what type of port you have. Incision care  Follow instructions from your health care provider about how to take care of your port insertion site. Make sure you: ? Wash your hands with soap and water before and after you change your bandage (dressing). If soap and water are not available, use hand sanitizer. ? Change your dressing as told by your health care provider. ? Leave stitches (sutures), skin glue, or adhesive strips in place. These skin closures may need to stay in place for 2 weeks or longer. If adhesive strip edges start to loosen and curl up, you may trim the loose edges. Do not remove adhesive strips completely unless your health care provider tells you to do that.  Check your port insertion site every day for signs of infection. Check for: ? Redness, swelling, or pain. ? Fluid or blood. ? Warmth. ? Pus or a bad smell.      Activity  Return to your normal activities as told by your health care provider. Ask your health care provider what activities are safe for you.  Do not  lift anything that is heavier than 10 lb (4.5 kg), or the limit that you are told, until your health care provider says that it is safe. General instructions  Take over-the-counter and prescription medicines only as told by your health care provider.  Do not take baths, swim, or use a hot tub until your health care provider approves. Ask your health care provider if you may take showers. You may only be allowed to take sponge baths.  Do not drive for 24 hours if you were given a sedative during your procedure.  Wear a medical alert bracelet in case of an emergency. This will tell any health care providers that you have a port.  Keep all follow-up visits as told by your health care provider. This is important. Contact a health care provider if:  You cannot flush your port with saline as directed, or you cannot draw blood from the port.  You have a fever or chills.  You have redness, swelling, or pain around your port insertion site.  You have fluid or blood coming from your port insertion site.  Your port insertion site feels warm to the touch.  You have pus or a bad smell coming from the port insertion site. Get help right away if:  You have chest pain or shortness of breath.  You have bleeding from your port that you cannot control. Summary  Take care of the port as told by your   health care provider. Keep the manufacturer's information card with you at all times.  Change your dressing as told by your health care provider.  Contact a health care provider if you have a fever or chills or if you have redness, swelling, or pain around your port insertion site.  Keep all follow-up visits as told by your health care provider. This information is not intended to replace advice given to you by your health care provider. Make sure you discuss any questions you have with your health care provider. Document Revised: 06/19/2018 Document Reviewed: 06/19/2018 Elsevier Patient Education   2021 Elsevier Inc.  

## 2021-03-15 NOTE — Progress Notes (Signed)
[name]   [phone]  PROGRESS NOTE  Patient Care Team: London Pepper, MD as PCP - General (Family Medicine)  Hematological/Oncological History # Classical Hodgkin's Lymphoma, Mixed Cellularity. 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 2) 10/27/2020: resection of neck mass. Pathology showed classical Hodgkin's lymphoma, mixed cellularity subtype 3) 11/13/2020: establish care with Dr. Lorenso Courier  4) 11/26/2020: PET CT scan shows single hypermetabolic unenlarged right level II cervical lymph node identified on today's study. FDG uptake is compatible with Deauville 4. Other small bilateral cervical and upper normal to borderline hepatoduodenal ligament lymph nodes show Deauville 2 uptake levels 5) 12/18/2020: Cycle 1 Day 1 of AVD chemotherapy  6) 01/15/2021: Cycle 2 Day 1 of AVD chemotherapy  7) 02/12/2021: PET CT scan shows no evidence of residual disease 8) 02/15/2021: Cycle 3 Day 1 of AVD chemotherapy  9) 03/01/2021: Cycle 3 Day 15 of AVD chemotherapy  10) 03/15/2021: Cycle 4 Day 1 of AVD chemotherapy   Interval History:  [name] 60 y.o. male with medical history significant for Classical Hodgkin's lymphoma Early Stage/Unfavorable risk who presents for a follow up visit. The patient's last visit was on 03/01/2021. In the interim since the last visit he has continued to tolerate treatment well.   On exam today Mr. Friedhoff reports the chemotherapy has been taking has been getting progressively more difficult.  He reports that his energy levels have declined and that he is "tired all the time".  He also reports that he is having more difficulty with the CPAP machine and notes that he wore it for the last 1.5 years without difficulty and now he wakes up several times of the night feeling like he "cannot breathe".  He notes he is predominantly stopped taking his  CPAP and feels like his OSA symptoms are now coming back.  He also reports that he has a desire to eat but when he eats he is not able to eat as much as he used to.  He is currently down 8 pounds from March.  He does not sleep well, but he knows that this is always been a problem.  Patient denies any fevers, chills, night sweats, shortness of breath, chest pain or cough.  He has no other complaints. A full 10 point ROS is listed below.  MEDICAL HISTORY:  Past Medical History:  Diagnosis Date  . Cancer ([place]) 10/15   recent dx prostate ca-no tx yet  . GERD (gastroesophageal reflux disease)   . Hypertension   . Pneumonia    history  . Teeth decayed     SURGICAL HISTORY: Past Surgical History:  Procedure Laterality Date  . IR IMAGING GUIDED PORT INSERTION  12/11/2020  . MASS BIOPSY Left 09/29/2014   Procedure: LEFT LOWER NECK MASS EXCISION;  Surgeon: Izora Gala, MD;  Location: [facility];  Service: ENT;  Laterality: Left;  . PAROTIDECTOMY Left 09/29/2014   Procedure: LEFT PAROTIDECTOMY;  Surgeon: Izora Gala, MD;  Location: [facility];  Service: ENT;  Laterality: Left;  . SEPTOPLASTY  1976    SOCIAL HISTORY: Social History   Socioeconomic History  . Marital status: Widowed    Spouse name: Not on file  . Number of children: Not on file  . Years of education: Not on file  . Highest education level: Not on file  Occupational History  . Not on file  Tobacco Use  .  Smoking status: Former Smoker    Packs/day: 3.00    Years: 32.00    Pack years: 96.00    Types: Cigarettes    Quit date: 11/26/2017    Years since quitting: 3.3  . Smokeless tobacco: Never Used  Vaping Use  . Vaping Use: Never used  Substance and Sexual Activity  . Alcohol use: Yes    Comment: twice a year  . Drug use: No  . Sexual activity: Not on file  Other Topics Concern  . Not on file  Social History Narrative  . Not on file   Social Determinants of Health   Financial  Resource Strain: Not on file  Food Insecurity: Not on file  Transportation Needs: Not on file  Physical Activity: Not on file  Stress: Not on file  Social Connections: Not on file  Intimate Partner Violence: Not on file    FAMILY HISTORY: Family History  Problem Relation Age of Onset  . Stroke Mother   . Heart disease Father     ALLERGIES:  has No Known Allergies.  MEDICATIONS:  Current Outpatient Medications  Medication Sig Dispense Refill  . acetaminophen (TYLENOL) 500 MG tablet Take 500 mg by mouth every 6 (six) hours as needed for headache.    Marland Kitchen amLODipine (NORVASC) 5 MG tablet Take 5 mg by mouth every evening.    . B Complex-Folic Acid (B COMPLEX FORMULA 1, W/ FA,) TABS Take 1 tablet by mouth daily. 30 tablet 3  . lidocaine-prilocaine (EMLA) cream Apply 1 application topically as needed. (Patient taking differently: Apply 1 application topically as needed (access port).) 30 g 0  . losartan (COZAAR) 100 MG tablet Take 100 mg by mouth every evening.    . metFORMIN (GLUCOPHAGE) 500 MG tablet Take 1 tablet by mouth 2 (two) times daily.    . ondansetron (ZOFRAN) 8 MG tablet Take 1 tablet (8 mg total) by mouth every 8 (eight) hours as needed for nausea or vomiting. 39 tablet 0  . pantoprazole (PROTONIX) 40 MG tablet Take 1 tablet by mouth every evening.    . prochlorperazine (COMPAZINE) 10 MG tablet Take 1 tablet (10 mg total) by mouth every 6 (six) hours as needed for nausea or vomiting. 30 tablet 0  . rosuvastatin (CRESTOR) 5 MG tablet Take 5 mg by mouth every evening.     No current facility-administered medications for this visit.   Facility-Administered Medications Ordered in Other Visits  Medication Dose Route Frequency Provider Last Rate Last Admin  . 0.9 %  sodium chloride infusion   Intravenous Continuous Allred, Darrell K, PA-C      . dacarbazine (DTIC) 920 mg in sodium chloride 0.9 % 250 mL chemo infusion  375 mg/m2 (Treatment Plan Recorded) Intravenous Once Orson Slick, MD      . dexamethasone (DECADRON) 10 mg in sodium chloride 0.9 % 50 mL IVPB  10 mg Intravenous Once Ledell Peoples IV, MD      . DOXOrubicin (ADRIAMYCIN) chemo injection 62 mg  25 mg/m2 (Treatment Plan Recorded) Intravenous Once Ledell Peoples IV, MD      . fosaprepitant (EMEND) 150 mg in sodium chloride 0.9 % 145 mL IVPB  150 mg Intravenous Once Narda Rutherford T IV, MD      . heparin lock flush 100 unit/mL  500 Units Intracatheter Once PRN Orson Slick, MD      . palonosetron (ALOXI) injection 0.25 mg  0.25 mg Intravenous Once Narda Rutherford T IV,  MD      . sodium chloride flush (NS) 0.9 % injection 10 mL  10 mL Intracatheter PRN Ledell Peoples IV, MD      . vinBLAStine (VELBAN) 14.6 mg in sodium chloride 0.9 % 50 mL chemo infusion  6 mg/m2 (Treatment Plan Recorded) Intravenous Once Orson Slick, MD        REVIEW OF SYSTEMS:   Constitutional: ( - ) fevers, ( - )  chills , ( - ) night sweats Eyes: ( - ) blurriness of vision, ( - ) double vision, ( - ) watery eyes Ears, nose, mouth, throat, and face: ( - ) mucositis, ( - ) sore throat Respiratory: ( - ) cough, ( - ) dyspnea, ( - ) wheezes Cardiovascular: ( - ) palpitation, ( - ) chest discomfort, ( - ) lower extremity swelling Gastrointestinal:  ( - ) nausea, ( - ) heartburn, ( - ) change in bowel habits Skin: ( - ) abnormal skin rashes Lymphatics: ( - ) new lymphadenopathy, ( - ) easy bruising Neurological: ( - ) numbness, ( - ) tingling, ( - ) new weaknesses Behavioral/Psych: ( - ) mood change, ( - ) new changes  All other systems were reviewed with the patient and are negative.  PHYSICAL EXAMINATION: ECOG PERFORMANCE STATUS: 1 - Symptomatic but completely ambulatory  Vitals:   03/15/21 1216  BP: 134/73  Pulse: (!) 101  Resp: 17  Temp: 97.7 F (36.5 C)  SpO2: 97%   Filed Weights   03/15/21 1216  Weight: 254 lb 12.8 oz (115.6 kg)    GENERAL: well appearing middle aged Caucasian male in NAD ORAL: No erythema  or purulent discharge noted.  SKIN: skin color, texture, turgor are normal, no rashes or significant lesions EYES: conjunctiva are pink and non-injected, sclera clear NECK: supple, non-tender LYMPH:  Prior surgical scars on right neck, some palpable tissue (unclear if residual node or scar tissue) no palpable lymphadenopathy in the cervical, axillary or supraclavicular lymph nodes.  LUNGS: clear to auscultation and percussion with normal breathing effort HEART: regular rate & rhythm and no murmurs and no lower extremity edema Musculoskeletal: no cyanosis of digits and no clubbing  PSYCH: alert & oriented x 3, fluent speech NEURO: no focal motor/sensory deficits  LABORATORY DATA:  I have reviewed the data as listed CBC Latest Ref Rng & Units 03/15/2021 03/01/2021 02/22/2021  WBC 4.0 - 10.5 K/uL 9.6 2.1(L) 3.2(L)  Hemoglobin 13.0 - 17.0 g/dL 12.9(L) 12.6(L) 11.9(L)  Hematocrit 39.0 - 52.0 % 38.6(L) 37.8(L) 35.4(L)  Platelets 150 - 400 K/uL 134(L) 248 207    CMP Latest Ref Rng & Units 03/15/2021 03/01/2021 02/15/2021  Glucose 70 - 99 mg/dL 194(H) 181(H) 205(H)  BUN 6 - 20 mg/dL '9 12 11  ' Creatinine 0.61 - 1.24 mg/dL 0.85 0.83 0.90  Sodium 135 - 145 mmol/L 139 138 138  Potassium 3.5 - 5.1 mmol/L 3.7 3.8 3.6  Chloride 98 - 111 mmol/L 104 104 105  CO2 22 - 32 mmol/L '23 22 23  ' Calcium 8.9 - 10.3 mg/dL 9.3 9.1 9.1  Total Protein 6.5 - 8.1 g/dL 7.3 7.4 7.2  Total Bilirubin 0.3 - 1.2 mg/dL 0.3 0.3 0.3  Alkaline Phos 38 - 126 U/L 79 58 64  AST 15 - 41 U/L 37 40 41  ALT 0 - 44 U/L 45(H) 57(H) 46(H)    No results found for: MPROTEIN No results found for: KPAFRELGTCHN, LAMBDASER, KAPLAMBRATIO   RADIOGRAPHIC STUDIES:  US  SOFT TISSUE HEAD & NECK (NON-THYROID)  Result Date: 02/22/2021 CLINICAL DATA:  Subcentimeter PET positive right parotid nodule, assess for biopsy EXAM: ULTRASOUND OF HEAD/NECK SOFT TISSUES TECHNIQUE: Ultrasound examination of the head and neck soft tissues was performed in the  area of clinical concern. COMPARISON:  02/12/2021 PET-CT FINDINGS: Ultrasound performed of the right parotid gland in preparation for possible biopsy. By ultrasound, the right parotid gland demonstrates 2 adjacent small subcentimeter benign-appearing lymph nodes with thin hypoechoic cortex and fatty hila. These appear to correlate with the PET-CT finding. No other suspicious parotid lesion or abnormality by ultrasound that warrants biopsy. Procedure not performed. IMPRESSION: Two adjacent right parotid subcentimeter benign-appearing lymph nodes appear to correlate with the PET-CT finding. Biopsy not performed. Electronically Signed   By: Jerilynn Mages.  Shick M.D.   On: 02/22/2021 14:02    ASSESSMENT & PLAN Tarrell Debes 60 y.o. male with medical history significant for Classical Hodgkin's lymphoma Early Stage/Unfavorable risk who presents for a follow up visit.  After review the labs, the records, review of the PFTs, and review the echocardiogram the findings are most consistent with an early stage/unfavorable risk classical Hodgkin's lymphoma.  Given that he meets unfavorable criteria via multiple guidelines I recommended that we proceed with treatment as recommended in the NCCN guidelines for patients with early stage unfavorable disease (which is the exact same treatment recommended for stage III-IV disease).  Previously we discussed the AVD chemotherapy regimen.  We discussed the expected side effects including possible hair loss, nausea, vomiting, diarrhea, constipation, cytopenias, neutropenia, cardiac dysfunction, and neurological damage.  The patient will be treated with every 2 week rounds of chemotherapy using this regimen. AVD chemotherapy consists of doxorubicin 25 mg/m on days 1 and 15, vinblastine 6 mg per metered squared IV on days 1 and 15, and dacarbazine 375 mg/m IV on days 1 and 15.  After 2 cycles of AVD chemotherapy, PET/CT scan from 02/12/2021 revealed complete response to therapy with no evidence  of lymphoma. The recommendation is to proceed with a further 4 cycles of AVD therapy.   # Classical Hodgkin's Lymphoma, Mixed Cellularity. 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 There are some smaller lymph nodes in the neck and one near the hepatoduodenal ligament, though would favor diagnosis of early stage --based on these criteria the recommendation would be for AVD x 2 cycles with interval PET CT scan followed by AVD x 4 cycles.  --interval PET on 02/12/2021 showed complete response to therapy --today is Cycle 4 Day 1 of AVD chemotherapy.  --will avoid bleomycin in this patient due to smoking history, mild abnormalities on PFTs and age --RTC for Cycle 4 Day 15 of treatment in 2 weeks   #Neutropenia, Severe --expected with this chemotherapy regimen. The use of GCSF with this chemotherapy is not routinely recommended as these patients rarely get neutropenic fever despite low ANCs --however, this patient had an episode of febrile neutropenia. Will pursue GCSF therapy following his treatment.   #Supportive Care --chemotherapy education to be scheduled  --zofran 33m q8H PRN and compazine 162mPO q6H for nausea -- EMLA cream for port -- no pain medication required at this time.   #Gum soreness: --uncertain etiology but physical exam did no reveal any gross evidence of infection. --Recommended a follow up  with dentist for further workup.   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 No biopsy recommended.  --continue to monitor on future scans.  No orders of the defined types were placed  in this encounter.  All questions were answered. The patient knows to call the clinic with any problems, questions or concerns.  A total of more than 30 minutes were spent on this encounter and over half of that time was spent on counseling and coordination of care as outlined above.   Ledell Peoples, MD Department of Hematology/Oncology [place] [facility] Phone: [phone] Pager: [phone] Email: [email or web address]  03/15/2021 1:07 PM

## 2021-03-17 ENCOUNTER — Inpatient Hospital Stay: Payer: BC Managed Care – PPO

## 2021-03-17 ENCOUNTER — Other Ambulatory Visit: Payer: Self-pay

## 2021-03-17 VITALS — BP 138/78 | HR 100 | Temp 97.8°F | Resp 18

## 2021-03-17 DIAGNOSIS — Z8546 Personal history of malignant neoplasm of prostate: Secondary | ICD-10-CM | POA: Diagnosis not present

## 2021-03-17 DIAGNOSIS — Z79899 Other long term (current) drug therapy: Secondary | ICD-10-CM | POA: Diagnosis not present

## 2021-03-17 DIAGNOSIS — C8121 Mixed cellularity classical Hodgkin lymphoma, lymph nodes of head, face, and neck: Secondary | ICD-10-CM

## 2021-03-17 DIAGNOSIS — Z7984 Long term (current) use of oral hypoglycemic drugs: Secondary | ICD-10-CM | POA: Diagnosis not present

## 2021-03-17 DIAGNOSIS — Z87891 Personal history of nicotine dependence: Secondary | ICD-10-CM | POA: Diagnosis not present

## 2021-03-17 DIAGNOSIS — T451X5D Adverse effect of antineoplastic and immunosuppressive drugs, subsequent encounter: Secondary | ICD-10-CM | POA: Diagnosis not present

## 2021-03-17 DIAGNOSIS — D701 Agranulocytosis secondary to cancer chemotherapy: Secondary | ICD-10-CM | POA: Diagnosis not present

## 2021-03-17 DIAGNOSIS — R5081 Fever presenting with conditions classified elsewhere: Secondary | ICD-10-CM | POA: Diagnosis not present

## 2021-03-17 DIAGNOSIS — I10 Essential (primary) hypertension: Secondary | ICD-10-CM | POA: Diagnosis not present

## 2021-03-17 DIAGNOSIS — Z8249 Family history of ischemic heart disease and other diseases of the circulatory system: Secondary | ICD-10-CM | POA: Diagnosis not present

## 2021-03-17 DIAGNOSIS — Z5111 Encounter for antineoplastic chemotherapy: Secondary | ICD-10-CM | POA: Diagnosis not present

## 2021-03-17 DIAGNOSIS — Z1501 Genetic susceptibility to malignant neoplasm of breast: Secondary | ICD-10-CM | POA: Diagnosis not present

## 2021-03-17 MED ORDER — PEGFILGRASTIM-CBQV 6 MG/0.6ML ~~LOC~~ SOSY
6.0000 mg | PREFILLED_SYRINGE | Freq: Once | SUBCUTANEOUS | Status: AC
Start: 2021-03-17 — End: 2021-03-17
  Administered 2021-03-17: 6 mg via SUBCUTANEOUS

## 2021-03-17 MED ORDER — PEGFILGRASTIM-CBQV 6 MG/0.6ML ~~LOC~~ SOSY
PREFILLED_SYRINGE | SUBCUTANEOUS | Status: AC
  Filled 2021-03-17: qty 0.6

## 2021-03-17 NOTE — Patient Instructions (Signed)
Pegfilgrastim injection What is this medicine? PEGFILGRASTIM (PEG fil gra stim) is a long-acting granulocyte colony-stimulating factor that stimulates the growth of neutrophils, a type of white blood cell important in the body's fight against infection. It is used to reduce the incidence of fever and infection in patients with certain types of cancer who are receiving chemotherapy that affects the bone marrow, and to increase survival after being exposed to high doses of radiation. This medicine may be used for other purposes; ask your health care provider or pharmacist if you have questions. COMMON BRAND NAME(S): Fulphila, Neulasta, Nyvepria, UDENYCA, Ziextenzo What should I tell my health care provider before I take this medicine? They need to know if you have any of these conditions:  kidney disease  latex allergy  ongoing radiation therapy  sickle cell disease  skin reactions to acrylic adhesives (On-Body Injector only)  an unusual or allergic reaction to pegfilgrastim, filgrastim, other medicines, foods, dyes, or preservatives  pregnant or trying to get pregnant  breast-feeding How should I use this medicine? This medicine is for injection under the skin. If you get this medicine at home, you will be taught how to prepare and give the pre-filled syringe or how to use the On-body Injector. Refer to the patient Instructions for Use for detailed instructions. Use exactly as directed. Tell your healthcare provider immediately if you suspect that the On-body Injector may not have performed as intended or if you suspect the use of the On-body Injector resulted in a missed or partial dose. It is important that you put your used needles and syringes in a special sharps container. Do not put them in a trash can. If you do not have a sharps container, call your pharmacist or healthcare provider to get one. Talk to your pediatrician regarding the use of this medicine in children. While this drug  may be prescribed for selected conditions, precautions do apply. Overdosage: If you think you have taken too much of this medicine contact a poison control center or emergency room at once. NOTE: This medicine is only for you. Do not share this medicine with others. What if I miss a dose? It is important not to miss your dose. Call your doctor or health care professional if you miss your dose. If you miss a dose due to an On-body Injector failure or leakage, a new dose should be administered as soon as possible using a single prefilled syringe for manual use. What may interact with this medicine? Interactions have not been studied. This list may not describe all possible interactions. Give your health care provider a list of all the medicines, herbs, non-prescription drugs, or dietary supplements you use. Also tell them if you smoke, drink alcohol, or use illegal drugs. Some items may interact with your medicine. What should I watch for while using this medicine? Your condition will be monitored carefully while you are receiving this medicine. You may need blood work done while you are taking this medicine. Talk to your health care provider about your risk of cancer. You may be more at risk for certain types of cancer if you take this medicine. If you are going to need a MRI, CT scan, or other procedure, tell your doctor that you are using this medicine (On-Body Injector only). What side effects may I notice from receiving this medicine? Side effects that you should report to your doctor or health care professional as soon as possible:  allergic reactions (skin rash, itching or hives, swelling of   the face, lips, or tongue)  back pain  dizziness  fever  pain, redness, or irritation at site where injected  pinpoint red spots on the skin  red or dark-brown urine  shortness of breath or breathing problems  stomach or side pain, or pain at the shoulder  swelling  tiredness  trouble  passing urine or change in the amount of urine  unusual bruising or bleeding Side effects that usually do not require medical attention (report to your doctor or health care professional if they continue or are bothersome):  bone pain  muscle pain This list may not describe all possible side effects. Call your doctor for medical advice about side effects. You may report side effects to FDA at 1-800-FDA-1088. Where should I keep my medicine? Keep out of the reach of children. If you are using this medicine at home, you will be instructed on how to store it. Throw away any unused medicine after the expiration date on the label. NOTE: This sheet is a summary. It may not cover all possible information. If you have questions about this medicine, talk to your doctor, pharmacist, or health care provider.  2021 Elsevier/Gold Standard (2019-12-13 13:20:51)  

## 2021-03-28 NOTE — Progress Notes (Signed)
[place] [phone]   [phone]  PROGRESS NOTE  Patient Care Team: London Pepper, MD as PCP - General (Family Medicine)  Hematological/Oncological History # Classical Hodgkin's Lymphoma, Mixed Cellularity. 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 2) 10/27/2020: resection of neck mass. Pathology showed classical Hodgkin's lymphoma, mixed cellularity subtype 3) 11/13/2020: establish care with Dr. Lorenso Courier  4) 11/26/2020: PET CT scan shows single hypermetabolic unenlarged right level II cervical lymph node identified on today's study. FDG uptake is compatible with [place] 4. Other small bilateral cervical and upper normal to borderline hepatoduodenal ligament lymph nodes show Deauville 2 uptake levels 5) 12/18/2020: Cycle 1 Day 1 of AVD chemotherapy  6) 01/15/2021: Cycle 2 Day 1 of AVD chemotherapy  7) 02/12/2021: PET CT scan shows no evidence of residual disease 8) 02/15/2021: Cycle 3 Day 1 of AVD chemotherapy  9) 03/01/2021: Cycle 3 Day 15 of AVD chemotherapy  10) 03/15/2021: Cycle 4 Day 1 of AVD chemotherapy  11) 03/29/2021: Cycle 4 Day 15 of AVD chemotherapy   Interval History:  Cristan Scherzer 60 y.o. male with medical history significant for Classical Hodgkin's lymphoma Early Stage/Unfavorable risk who presents for a follow up visit. The patient's last visit was on 03/15/2021. In the interim since the last visit he has continued to tolerate treatment well.   On exam today Mr. Murdaugh reports that the last cycle of chemotherapy "beat him up".  He notes that his body gets quite tired and sore more and more each chemotherapy cycle.  He reports that it used to take a few days to recover now takes him at least a full week.  He reports that he is also having difficulty with his CPAP machine and difficulty breathing while he has been on chemotherapy.  Otherwise his appetite has  been quite good and he has been having no issues with nausea, vomiting, or diarrhea.  His energy is low and his body does hurt, but he is otherwise willing and able to proceed with chemotherapy at this time.  Patient denies any fevers, chills, night sweats, shortness of breath, chest pain or cough.  He has no other complaints. A full 10 point ROS is listed below.  MEDICAL HISTORY:  Past Medical History:  Diagnosis Date  . Cancer ([facility]) 10/15   recent dx prostate ca-no tx yet  . GERD (gastroesophageal reflux disease)   . Hypertension   . Pneumonia    history  . Teeth decayed     SURGICAL HISTORY: Past Surgical History:  Procedure Laterality Date  . IR IMAGING GUIDED PORT INSERTION  12/11/2020  . MASS BIOPSY Left 09/29/2014   Procedure: LEFT LOWER NECK MASS EXCISION;  Surgeon: Izora Gala, MD;  Location: [facility];  Service: ENT;  Laterality: Left;  . PAROTIDECTOMY Left 09/29/2014   Procedure: LEFT PAROTIDECTOMY;  Surgeon: Izora Gala, MD;  Location: [facility];  Service: ENT;  Laterality: Left;  . SEPTOPLASTY  1976    SOCIAL HISTORY: Social History   Socioeconomic History  . Marital status: Widowed    Spouse name: Not on file  . Number of children: Not on file  . Years of education: Not on file  . Highest education level: Not on file  Occupational History  . Not on file  Tobacco Use  . Smoking status: Former Smoker    Packs/day: 3.00    Years: 32.00    Pack  years: 96.00    Types: Cigarettes    Quit date: 11/26/2017    Years since quitting: 3.3  . Smokeless tobacco: Never Used  Vaping Use  . Vaping Use: Never used  Substance and Sexual Activity  . Alcohol use: Yes    Comment: twice a year  . Drug use: No  . Sexual activity: Not on file  Other Topics Concern  . Not on file  Social History Narrative  . Not on file   Social Determinants of Health   Financial Resource Strain: Not on file  Food Insecurity: Not on file  Transportation  Needs: Not on file  Physical Activity: Not on file  Stress: Not on file  Social Connections: Not on file  Intimate Partner Violence: Not on file    FAMILY HISTORY: Family History  Problem Relation Age of Onset  . Stroke Mother   . Heart disease Father     ALLERGIES:  has No Known Allergies.  MEDICATIONS:  Current Outpatient Medications  Medication Sig Dispense Refill  . acetaminophen (TYLENOL) 500 MG tablet Take 500 mg by mouth every 6 (six) hours as needed for headache.    Marland Kitchen amLODipine (NORVASC) 5 MG tablet Take 5 mg by mouth every evening.    . B Complex-Folic Acid (B COMPLEX FORMULA 1, W/ FA,) TABS Take 1 tablet by mouth daily. 30 tablet 3  . lidocaine-prilocaine (EMLA) cream Apply 1 application topically as needed. (Patient taking differently: Apply 1 application topically as needed (access port).) 30 g 0  . losartan (COZAAR) 100 MG tablet Take 100 mg by mouth every evening.    . metFORMIN (GLUCOPHAGE) 500 MG tablet Take 1 tablet by mouth 2 (two) times daily.    . ondansetron (ZOFRAN) 8 MG tablet Take 1 tablet (8 mg total) by mouth every 8 (eight) hours as needed for nausea or vomiting. 39 tablet 0  . pantoprazole (PROTONIX) 40 MG tablet Take 1 tablet by mouth every evening.    . prochlorperazine (COMPAZINE) 10 MG tablet Take 1 tablet (10 mg total) by mouth every 6 (six) hours as needed for nausea or vomiting. 30 tablet 0  . rosuvastatin (CRESTOR) 5 MG tablet Take 5 mg by mouth every evening.     No current facility-administered medications for this visit.   Facility-Administered Medications Ordered in Other Visits  Medication Dose Route Frequency Provider Last Rate Last Admin  . 0.9 %  sodium chloride infusion   Intravenous Continuous Allred, Darrell K, PA-C        REVIEW OF SYSTEMS:   Constitutional: ( - ) fevers, ( - )  chills , ( - ) night sweats Eyes: ( - ) blurriness of vision, ( - ) double vision, ( - ) watery eyes Ears, nose, mouth, throat, and face: ( - )  mucositis, ( - ) sore throat Respiratory: ( - ) cough, ( - ) dyspnea, ( - ) wheezes Cardiovascular: ( - ) palpitation, ( - ) chest discomfort, ( - ) lower extremity swelling Gastrointestinal:  ( - ) nausea, ( - ) heartburn, ( - ) change in bowel habits Skin: ( - ) abnormal skin rashes Lymphatics: ( - ) new lymphadenopathy, ( - ) easy bruising Neurological: ( - ) numbness, ( - ) tingling, ( - ) new weaknesses Behavioral/Psych: ( - ) mood change, ( - ) new changes  All other systems were reviewed with the patient and are negative.  PHYSICAL EXAMINATION: ECOG PERFORMANCE STATUS: 1 - Symptomatic but completely ambulatory  Vitals:   03/29/21 1059  BP: (!) 143/77  Pulse: 97  Resp: 18  Temp: 98.8 F (37.1 C)  SpO2: 98%   Filed Weights   03/29/21 1059  Weight: 256 lb 4.8 oz (116.3 kg)    GENERAL: well appearing middle aged Caucasian male in NAD SKIN: skin color, texture, turgor are normal, no rashes or significant lesions EYES: conjunctiva are pink and non-injected, sclera clear LUNGS: clear to auscultation and percussion with normal breathing effort HEART: regular rate & rhythm and no murmurs and no lower extremity edema Musculoskeletal: no cyanosis of digits and no clubbing  PSYCH: alert & oriented x 3, fluent speech NEURO: no focal motor/sensory deficits  LABORATORY DATA:  I have reviewed the data as listed CBC Latest Ref Rng & Units 03/29/2021 03/15/2021 03/01/2021  WBC 4.0 - 10.5 K/uL 12.1(H) 9.6 2.1(L)  Hemoglobin 13.0 - 17.0 g/dL 12.2(L) 12.9(L) 12.6(L)  Hematocrit 39.0 - 52.0 % 37.0(L) 38.6(L) 37.8(L)  Platelets 150 - 400 K/uL 176 134(L) 248    CMP Latest Ref Rng & Units 03/29/2021 03/15/2021 03/01/2021  Glucose 70 - 99 mg/dL 254(H) 194(H) 181(H)  BUN 6 - 20 mg/dL '9 9 12  ' Creatinine 0.61 - 1.24 mg/dL 0.93 0.85 0.83  Sodium 135 - 145 mmol/L 139 139 138  Potassium 3.5 - 5.1 mmol/L 3.8 3.7 3.8  Chloride 98 - 111 mmol/L 104 104 104  CO2 22 - 32 mmol/L '23 23 22  ' Calcium 8.9  - 10.3 mg/dL 8.9 9.3 9.1  Total Protein 6.5 - 8.1 g/dL 7.1 7.3 7.4  Total Bilirubin 0.3 - 1.2 mg/dL 0.4 0.3 0.3  Alkaline Phos 38 - 126 U/L 81 79 58  AST 15 - 41 U/L 41 37 40  ALT 0 - 44 U/L 48(H) 45(H) 57(H)    No results found for: MPROTEIN No results found for: KPAFRELGTCHN, LAMBDASER, KAPLAMBRATIO   RADIOGRAPHIC STUDIES:  No results found.  ASSESSMENT & PLAN Lemario Chaikin 60 y.o. male with medical history significant for Classical Hodgkin's lymphoma Early Stage/Unfavorable risk who presents for a follow up visit.  After review the labs, the records, review of the PFTs, and review the echocardiogram the findings are most consistent with an early stage/unfavorable risk classical Hodgkin's lymphoma.  Given that he meets unfavorable criteria via multiple guidelines I recommended that we proceed with treatment as recommended in the NCCN guidelines for patients with early stage unfavorable disease (which is the exact same treatment recommended for stage III-IV disease).  Previously we discussed the AVD chemotherapy regimen.  We discussed the expected side effects including possible hair loss, nausea, vomiting, diarrhea, constipation, cytopenias, neutropenia, cardiac dysfunction, and neurological damage.  The patient will be treated with every 2 week rounds of chemotherapy using this regimen. AVD chemotherapy consists of doxorubicin 25 mg/m on days 1 and 15, vinblastine 6 mg per metered squared IV on days 1 and 15, and dacarbazine 375 mg/m IV on days 1 and 15.  After 2 cycles of AVD chemotherapy, PET/CT scan from 02/12/2021 revealed complete response to therapy with no evidence of lymphoma. The recommendation is to proceed with a further 4 cycles of AVD therapy.   # Classical Hodgkin's Lymphoma, Mixed Cellularity. 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 There are some smaller lymph nodes in the  neck and one near the hepatoduodenal ligament, though would favor diagnosis of early stage --based on these criteria the recommendation would be for AVD x 2 cycles with interval PET CT scan followed by AVD x 4 cycles.  --interval PET on 02/12/2021 showed complete response to therapy --today is Cycle 4 Day 15 of AVD chemotherapy.  --will avoid bleomycin in this patient due to smoking history, mild abnormalities on PFTs and age --RTC for Cycle 5 Day 11 of treatment in 2 weeks   #Neutropenia, Severe --expected with this chemotherapy regimen. The use of GCSF with this chemotherapy is not routinely recommended as these patients rarely get neutropenic fever despite low ANCs --however, this patient had an episode of febrile neutropenia. Will pursue GCSF therapy following his treatment.   #Supportive Care --chemotherapy education to be scheduled  --zofran 75m q8H PRN and compazine 136mPO q6H for nausea -- EMLA cream for port -- no pain medication required at this time.   #Gum soreness: --uncertain etiology but physical exam did no reveal any gross evidence of infection. --Recommended a follow up with dentist for further workup.   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 No biopsy recommended.  --continue to monitor on future scans.  No orders of the defined types were placed in this encounter.  All questions were answered. The patient knows to call the clinic with any problems, questions or concerns.  A total of more than 30 minutes were spent on this encounter and over half of that time was spent on counseling and coordination of care as outlined above.   JoLedell PeoplesMD Department  of Hematology/Oncology CoFort Shawt WeSierra Amb[facility] Surgery Center A Medical Corporationhone: [phone]: [phone]: [email or web address]  03/30/2021 12:20 PM

## 2021-03-29 ENCOUNTER — Inpatient Hospital Stay: Payer: BC Managed Care – PPO

## 2021-03-29 ENCOUNTER — Inpatient Hospital Stay (HOSPITAL_BASED_OUTPATIENT_CLINIC_OR_DEPARTMENT_OTHER): Payer: BC Managed Care – PPO | Admitting: Hematology and Oncology

## 2021-03-29 ENCOUNTER — Other Ambulatory Visit: Payer: Self-pay

## 2021-03-29 VITALS — BP 143/77 | HR 97 | Temp 98.8°F | Resp 18 | Wt 256.3 lb

## 2021-03-29 DIAGNOSIS — C8121 Mixed cellularity classical Hodgkin lymphoma, lymph nodes of head, face, and neck: Secondary | ICD-10-CM | POA: Diagnosis not present

## 2021-03-29 DIAGNOSIS — Z87891 Personal history of nicotine dependence: Secondary | ICD-10-CM | POA: Diagnosis not present

## 2021-03-29 DIAGNOSIS — Z5111 Encounter for antineoplastic chemotherapy: Secondary | ICD-10-CM | POA: Diagnosis not present

## 2021-03-29 DIAGNOSIS — R5081 Fever presenting with conditions classified elsewhere: Secondary | ICD-10-CM | POA: Diagnosis not present

## 2021-03-29 DIAGNOSIS — T451X5D Adverse effect of antineoplastic and immunosuppressive drugs, subsequent encounter: Secondary | ICD-10-CM | POA: Diagnosis not present

## 2021-03-29 DIAGNOSIS — Z1501 Genetic susceptibility to malignant neoplasm of breast: Secondary | ICD-10-CM | POA: Diagnosis not present

## 2021-03-29 DIAGNOSIS — I10 Essential (primary) hypertension: Secondary | ICD-10-CM | POA: Diagnosis not present

## 2021-03-29 DIAGNOSIS — D701 Agranulocytosis secondary to cancer chemotherapy: Secondary | ICD-10-CM | POA: Diagnosis not present

## 2021-03-29 DIAGNOSIS — Z8249 Family history of ischemic heart disease and other diseases of the circulatory system: Secondary | ICD-10-CM | POA: Diagnosis not present

## 2021-03-29 DIAGNOSIS — Z8546 Personal history of malignant neoplasm of prostate: Secondary | ICD-10-CM | POA: Diagnosis not present

## 2021-03-29 DIAGNOSIS — Z79899 Other long term (current) drug therapy: Secondary | ICD-10-CM | POA: Diagnosis not present

## 2021-03-29 DIAGNOSIS — Z7984 Long term (current) use of oral hypoglycemic drugs: Secondary | ICD-10-CM | POA: Diagnosis not present

## 2021-03-29 DIAGNOSIS — Z95828 Presence of other vascular implants and grafts: Secondary | ICD-10-CM | POA: Diagnosis not present

## 2021-03-29 LAB — CBC WITH DIFFERENTIAL (CANCER CENTER ONLY)
Abs Immature Granulocytes: 0.9 10*3/uL — ABNORMAL HIGH (ref 0.00–0.07)
Basophils Absolute: 0.1 10*3/uL (ref 0.0–0.1)
Basophils Relative: 1 %
Eosinophils Absolute: 0.2 10*3/uL (ref 0.0–0.5)
Eosinophils Relative: 2 %
HCT: 37 % — ABNORMAL LOW (ref 39.0–52.0)
Hemoglobin: 12.2 g/dL — ABNORMAL LOW (ref 13.0–17.0)
Immature Granulocytes: 7 %
Lymphocytes Relative: 11 %
Lymphs Abs: 1.4 10*3/uL (ref 0.7–4.0)
MCH: 30 pg (ref 26.0–34.0)
MCHC: 33 g/dL (ref 30.0–36.0)
MCV: 90.9 fL (ref 80.0–100.0)
Monocytes Absolute: 0.8 10*3/uL (ref 0.1–1.0)
Monocytes Relative: 7 %
Neutro Abs: 8.7 10*3/uL — ABNORMAL HIGH (ref 1.7–7.7)
Neutrophils Relative %: 72 %
Platelet Count: 176 10*3/uL (ref 150–400)
RBC: 4.07 MIL/uL — ABNORMAL LOW (ref 4.22–5.81)
RDW: 17 % — ABNORMAL HIGH (ref 11.5–15.5)
WBC Count: 12.1 10*3/uL — ABNORMAL HIGH (ref 4.0–10.5)
nRBC: 0 % (ref 0.0–0.2)

## 2021-03-29 LAB — CMP (CANCER CENTER ONLY)
ALT: 48 U/L — ABNORMAL HIGH (ref 0–44)
AST: 41 U/L (ref 15–41)
Albumin: 3.9 g/dL (ref 3.5–5.0)
Alkaline Phosphatase: 81 U/L (ref 38–126)
Anion gap: 12 (ref 5–15)
BUN: 9 mg/dL (ref 6–20)
CO2: 23 mmol/L (ref 22–32)
Calcium: 8.9 mg/dL (ref 8.9–10.3)
Chloride: 104 mmol/L (ref 98–111)
Creatinine: 0.93 mg/dL (ref 0.61–1.24)
GFR, Estimated: >60 mL/min (ref >60)
Glucose, Bld: 254 mg/dL — ABNORMAL HIGH (ref 70–99)
Potassium: 3.8 mmol/L (ref 3.5–5.1)
Sodium: 139 mmol/L (ref 135–145)
Total Bilirubin: 0.4 mg/dL (ref 0.3–1.2)
Total Protein: 7.1 g/dL (ref 6.5–8.1)

## 2021-03-29 LAB — LACTATE DEHYDROGENASE: LDH: 221 U/L — ABNORMAL HIGH (ref 98–192)

## 2021-03-29 MED ORDER — VINBLASTINE SULFATE CHEMO INJECTION 1 MG/ML
6.0000 mg/m2 | Freq: Once | INTRAVENOUS | Status: AC
  Administered 2021-03-29: 14.6 mg via INTRAVENOUS
  Filled 2021-03-29: qty 14.6

## 2021-03-29 MED ORDER — SODIUM CHLORIDE 0.9 % IV SOLN
10.0000 mg | Freq: Once | INTRAVENOUS | Status: AC
  Administered 2021-03-29: 10 mg via INTRAVENOUS
  Filled 2021-03-29: qty 10

## 2021-03-29 MED ORDER — HEPARIN SOD (PORK) LOCK FLUSH 100 UNIT/ML IV SOLN
500.0000 [IU] | Freq: Once | INTRAVENOUS | Status: AC | PRN
Start: 2021-03-29 — End: 2021-03-29
  Administered 2021-03-29: 500 [IU]
  Filled 2021-03-29: qty 5

## 2021-03-29 MED ORDER — SODIUM CHLORIDE 0.9% FLUSH
10.0000 mL | INTRAVENOUS | Status: DC | PRN
  Administered 2021-03-29: 10 mL
  Filled 2021-03-29: qty 10

## 2021-03-29 MED ORDER — SODIUM CHLORIDE 0.9 % IV SOLN
Freq: Once | INTRAVENOUS | Status: AC
  Filled 2021-03-29: qty 250

## 2021-03-29 MED ORDER — SODIUM CHLORIDE 0.9 % IV SOLN
375.0000 mg/m2 | Freq: Once | INTRAVENOUS | Status: AC
  Administered 2021-03-29: 920 mg via INTRAVENOUS
  Filled 2021-03-29: qty 92

## 2021-03-29 MED ORDER — SODIUM CHLORIDE 0.9 % IV SOLN
150.0000 mg | Freq: Once | INTRAVENOUS | Status: AC
  Administered 2021-03-29: 150 mg via INTRAVENOUS
  Filled 2021-03-29: qty 150

## 2021-03-29 MED ORDER — DOXORUBICIN HCL CHEMO IV INJECTION 2 MG/ML
25.0000 mg/m2 | Freq: Once | INTRAVENOUS | Status: AC
  Administered 2021-03-29: 62 mg via INTRAVENOUS
  Filled 2021-03-29: qty 31

## 2021-03-29 MED ORDER — PALONOSETRON HCL INJECTION 0.25 MG/5ML
0.2500 mg | Freq: Once | INTRAVENOUS | Status: AC
  Administered 2021-03-29: 0.25 mg via INTRAVENOUS

## 2021-03-29 MED ORDER — PALONOSETRON HCL INJECTION 0.25 MG/5ML
INTRAVENOUS | Status: AC
  Filled 2021-03-29: qty 5

## 2021-03-29 NOTE — Patient Instructions (Signed)
[facility]  Discharge Instructions: Thank you for choosing [facility] to provide your oncology and hematology care.   If you have a lab appointment with the Cancer Center, please go directly to the Cancer Center and check in at the registration area.   Wear comfortable clothing and clothing appropriate for easy access to any Portacath or PICC line.   We strive to give you quality time with your provider. You may need to reschedule your appointment if you arrive late (15 or more minutes).  Arriving late affects you and other patients whose appointments are after yours.  Also, if you miss three or more appointments without notifying the office, you may be dismissed from the clinic at the provider's discretion.      For prescription refill requests, have your pharmacy contact our office and allow 72 hours for refills to be completed.    Today you received the following chemotherapy and/or immunotherapy agents: Adriamycin, Vinblastine, DTIC    To help prevent nausea and vomiting after your treatment, we encourage you to take your nausea medication as directed.  BELOW ARE SYMPTOMS THAT SHOULD BE REPORTED IMMEDIATELY: . *FEVER GREATER THAN 100.4 F (38 C) OR HIGHER . *CHILLS OR SWEATING . *NAUSEA AND VOMITING THAT IS NOT CONTROLLED WITH YOUR NAUSEA MEDICATION . *UNUSUAL SHORTNESS OF BREATH . *UNUSUAL BRUISING OR BLEEDING . *URINARY PROBLEMS (pain or burning when urinating, or frequent urination) . *BOWEL PROBLEMS (unusual diarrhea, constipation, pain near the anus) . TENDERNESS IN MOUTH AND THROAT WITH OR WITHOUT PRESENCE OF ULCERS (sore throat, sores in mouth, or a toothache) . UNUSUAL RASH, SWELLING OR PAIN  . UNUSUAL VAGINAL DISCHARGE OR ITCHING   Items with * indicate a potential emergency and should be followed up as soon as possible or go to the Emergency Department if any problems should occur.  Please show the CHEMOTHERAPY ALERT CARD or  IMMUNOTHERAPY ALERT CARD at check-in to the Emergency Department and triage nurse.  Should you have questions after your visit or need to cancel or reschedule your appointment, please contact [facility]  Dept: [phone]  and follow the prompts.  Office hours are 8:00 a.m. to 4:30 p.m. Monday - Friday. Please note that voicemails left after 4:00 p.m. may not be returned until the following business day.  We are closed weekends and major holidays. You have access to a nurse at all times for urgent questions. Please call the main number to the clinic Dept: [phone] and follow the prompts.   For any non-urgent questions, you may also contact your provider using MyChart. We now offer e-Visits for anyone 18 and older to request care online for non-urgent symptoms. For details visit [email or web address].   Also download the MyChart app! Go to the app store, search "MyChart", open the app, select [facility], and log in with your MyChart username and password.  Due to Covid, a mask is required upon entering the hospital/clinic. If you do not have a mask, one will be given to you upon arrival. For doctor visits, patients may have 1 support person aged 18 or older with them. For treatment visits, patients cannot have anyone with them due to current Covid guidelines and our immunocompromised population.   

## 2021-03-30 ENCOUNTER — Encounter: Payer: Self-pay | Admitting: Hematology and Oncology

## 2021-03-31 ENCOUNTER — Other Ambulatory Visit: Payer: Self-pay

## 2021-03-31 ENCOUNTER — Inpatient Hospital Stay: Payer: BC Managed Care – PPO

## 2021-03-31 VITALS — BP 138/72 | HR 97 | Temp 98.2°F | Resp 18

## 2021-03-31 DIAGNOSIS — D701 Agranulocytosis secondary to cancer chemotherapy: Secondary | ICD-10-CM | POA: Diagnosis not present

## 2021-03-31 DIAGNOSIS — Z7984 Long term (current) use of oral hypoglycemic drugs: Secondary | ICD-10-CM | POA: Diagnosis not present

## 2021-03-31 DIAGNOSIS — Z8546 Personal history of malignant neoplasm of prostate: Secondary | ICD-10-CM | POA: Diagnosis not present

## 2021-03-31 DIAGNOSIS — Z87891 Personal history of nicotine dependence: Secondary | ICD-10-CM | POA: Diagnosis not present

## 2021-03-31 DIAGNOSIS — Z8249 Family history of ischemic heart disease and other diseases of the circulatory system: Secondary | ICD-10-CM | POA: Diagnosis not present

## 2021-03-31 DIAGNOSIS — C8121 Mixed cellularity classical Hodgkin lymphoma, lymph nodes of head, face, and neck: Secondary | ICD-10-CM

## 2021-03-31 DIAGNOSIS — Z1501 Genetic susceptibility to malignant neoplasm of breast: Secondary | ICD-10-CM | POA: Diagnosis not present

## 2021-03-31 DIAGNOSIS — T451X5D Adverse effect of antineoplastic and immunosuppressive drugs, subsequent encounter: Secondary | ICD-10-CM | POA: Diagnosis not present

## 2021-03-31 DIAGNOSIS — Z5111 Encounter for antineoplastic chemotherapy: Secondary | ICD-10-CM | POA: Diagnosis not present

## 2021-03-31 DIAGNOSIS — Z79899 Other long term (current) drug therapy: Secondary | ICD-10-CM | POA: Diagnosis not present

## 2021-03-31 DIAGNOSIS — I10 Essential (primary) hypertension: Secondary | ICD-10-CM | POA: Diagnosis not present

## 2021-03-31 DIAGNOSIS — R5081 Fever presenting with conditions classified elsewhere: Secondary | ICD-10-CM | POA: Diagnosis not present

## 2021-03-31 MED ORDER — PEGFILGRASTIM-CBQV 6 MG/0.6ML ~~LOC~~ SOSY
PREFILLED_SYRINGE | SUBCUTANEOUS | Status: AC
  Filled 2021-03-31: qty 0.6

## 2021-03-31 MED ORDER — PEGFILGRASTIM-CBQV 6 MG/0.6ML ~~LOC~~ SOSY
6.0000 mg | PREFILLED_SYRINGE | Freq: Once | SUBCUTANEOUS | Status: AC
Start: 2021-03-31 — End: 2021-03-31
  Administered 2021-03-31: 6 mg via SUBCUTANEOUS

## 2021-03-31 NOTE — Patient Instructions (Signed)
Pegfilgrastim injection What is this medicine? PEGFILGRASTIM (PEG fil gra stim) is a long-acting granulocyte colony-stimulating factor that stimulates the growth of neutrophils, a type of white blood cell important in the body's fight against infection. It is used to reduce the incidence of fever and infection in patients with certain types of cancer who are receiving chemotherapy that affects the bone marrow, and to increase survival after being exposed to high doses of radiation. This medicine may be used for other purposes; ask your health care provider or pharmacist if you have questions. COMMON BRAND NAME(S): Fulphila, Neulasta, Nyvepria, UDENYCA, Ziextenzo What should I tell my health care provider before I take this medicine? They need to know if you have any of these conditions:  kidney disease  latex allergy  ongoing radiation therapy  sickle cell disease  skin reactions to acrylic adhesives (On-Body Injector only)  an unusual or allergic reaction to pegfilgrastim, filgrastim, other medicines, foods, dyes, or preservatives  pregnant or trying to get pregnant  breast-feeding How should I use this medicine? This medicine is for injection under the skin. If you get this medicine at home, you will be taught how to prepare and give the pre-filled syringe or how to use the On-body Injector. Refer to the patient Instructions for Use for detailed instructions. Use exactly as directed. Tell your healthcare provider immediately if you suspect that the On-body Injector may not have performed as intended or if you suspect the use of the On-body Injector resulted in a missed or partial dose. It is important that you put your used needles and syringes in a special sharps container. Do not put them in a trash can. If you do not have a sharps container, call your pharmacist or healthcare provider to get one. Talk to your pediatrician regarding the use of this medicine in children. While this drug  may be prescribed for selected conditions, precautions do apply. Overdosage: If you think you have taken too much of this medicine contact a poison control center or emergency room at once. NOTE: This medicine is only for you. Do not share this medicine with others. What if I miss a dose? It is important not to miss your dose. Call your doctor or health care professional if you miss your dose. If you miss a dose due to an On-body Injector failure or leakage, a new dose should be administered as soon as possible using a single prefilled syringe for manual use. What may interact with this medicine? Interactions have not been studied. This list may not describe all possible interactions. Give your health care provider a list of all the medicines, herbs, non-prescription drugs, or dietary supplements you use. Also tell them if you smoke, drink alcohol, or use illegal drugs. Some items may interact with your medicine. What should I watch for while using this medicine? Your condition will be monitored carefully while you are receiving this medicine. You may need blood work done while you are taking this medicine. Talk to your health care provider about your risk of cancer. You may be more at risk for certain types of cancer if you take this medicine. If you are going to need a MRI, CT scan, or other procedure, tell your doctor that you are using this medicine (On-Body Injector only). What side effects may I notice from receiving this medicine? Side effects that you should report to your doctor or health care professional as soon as possible:  allergic reactions (skin rash, itching or hives, swelling of   the face, lips, or tongue)  back pain  dizziness  fever  pain, redness, or irritation at site where injected  pinpoint red spots on the skin  red or dark-brown urine  shortness of breath or breathing problems  stomach or side pain, or pain at the shoulder  swelling  tiredness  trouble  passing urine or change in the amount of urine  unusual bruising or bleeding Side effects that usually do not require medical attention (report to your doctor or health care professional if they continue or are bothersome):  bone pain  muscle pain This list may not describe all possible side effects. Call your doctor for medical advice about side effects. You may report side effects to FDA at 1-800-FDA-1088. Where should I keep my medicine? Keep out of the reach of children. If you are using this medicine at home, you will be instructed on how to store it. Throw away any unused medicine after the expiration date on the label. NOTE: This sheet is a summary. It may not cover all possible information. If you have questions about this medicine, talk to your doctor, pharmacist, or health care provider.  2021 Elsevier/Gold Standard (2019-12-13 13:20:51)  

## 2021-04-12 ENCOUNTER — Encounter: Payer: Self-pay | Admitting: Hematology and Oncology

## 2021-04-12 ENCOUNTER — Inpatient Hospital Stay: Payer: BC Managed Care – PPO | Attending: Hematology and Oncology | Admitting: Hematology and Oncology

## 2021-04-12 ENCOUNTER — Inpatient Hospital Stay: Payer: BC Managed Care – PPO

## 2021-04-12 ENCOUNTER — Other Ambulatory Visit: Payer: BC Managed Care – PPO

## 2021-04-12 ENCOUNTER — Other Ambulatory Visit: Payer: Self-pay

## 2021-04-12 VITALS — BP 146/73 | HR 94 | Temp 98.7°F | Resp 13 | Ht 70.0 in | Wt 254.8 lb

## 2021-04-12 DIAGNOSIS — T451X5D Adverse effect of antineoplastic and immunosuppressive drugs, subsequent encounter: Secondary | ICD-10-CM | POA: Diagnosis not present

## 2021-04-12 DIAGNOSIS — Z8546 Personal history of malignant neoplasm of prostate: Secondary | ICD-10-CM | POA: Diagnosis not present

## 2021-04-12 DIAGNOSIS — G4700 Insomnia, unspecified: Secondary | ICD-10-CM | POA: Diagnosis not present

## 2021-04-12 DIAGNOSIS — Z7984 Long term (current) use of oral hypoglycemic drugs: Secondary | ICD-10-CM | POA: Insufficient documentation

## 2021-04-12 DIAGNOSIS — Z87891 Personal history of nicotine dependence: Secondary | ICD-10-CM | POA: Insufficient documentation

## 2021-04-12 DIAGNOSIS — D709 Neutropenia, unspecified: Secondary | ICD-10-CM

## 2021-04-12 DIAGNOSIS — Z79899 Other long term (current) drug therapy: Secondary | ICD-10-CM | POA: Insufficient documentation

## 2021-04-12 DIAGNOSIS — I10 Essential (primary) hypertension: Secondary | ICD-10-CM | POA: Diagnosis not present

## 2021-04-12 DIAGNOSIS — Z5111 Encounter for antineoplastic chemotherapy: Secondary | ICD-10-CM | POA: Insufficient documentation

## 2021-04-12 DIAGNOSIS — C8121 Mixed cellularity classical Hodgkin lymphoma, lymph nodes of head, face, and neck: Secondary | ICD-10-CM

## 2021-04-12 DIAGNOSIS — Z95828 Presence of other vascular implants and grafts: Secondary | ICD-10-CM | POA: Insufficient documentation

## 2021-04-12 DIAGNOSIS — Z8249 Family history of ischemic heart disease and other diseases of the circulatory system: Secondary | ICD-10-CM | POA: Insufficient documentation

## 2021-04-12 DIAGNOSIS — Z5189 Encounter for other specified aftercare: Secondary | ICD-10-CM | POA: Diagnosis not present

## 2021-04-12 DIAGNOSIS — D701 Agranulocytosis secondary to cancer chemotherapy: Secondary | ICD-10-CM | POA: Diagnosis not present

## 2021-04-12 DIAGNOSIS — Z1501 Genetic susceptibility to malignant neoplasm of breast: Secondary | ICD-10-CM | POA: Insufficient documentation

## 2021-04-12 DIAGNOSIS — R5081 Fever presenting with conditions classified elsewhere: Secondary | ICD-10-CM

## 2021-04-12 LAB — CBC WITH DIFFERENTIAL (CANCER CENTER ONLY)
Abs Immature Granulocytes: 0.74 10*3/uL — ABNORMAL HIGH (ref 0.00–0.07)
Basophils Absolute: 0.1 10*3/uL (ref 0.0–0.1)
Basophils Relative: 1 %
Eosinophils Absolute: 0.2 10*3/uL (ref 0.0–0.5)
Eosinophils Relative: 2 %
HCT: 34.9 % — ABNORMAL LOW (ref 39.0–52.0)
Hemoglobin: 11.5 g/dL — ABNORMAL LOW (ref 13.0–17.0)
Immature Granulocytes: 6 %
Lymphocytes Relative: 10 %
Lymphs Abs: 1.2 10*3/uL (ref 0.7–4.0)
MCH: 30 pg (ref 26.0–34.0)
MCHC: 33 g/dL (ref 30.0–36.0)
MCV: 91.1 fL (ref 80.0–100.0)
Monocytes Absolute: 0.6 10*3/uL (ref 0.1–1.0)
Monocytes Relative: 5 %
Neutro Abs: 9.3 10*3/uL — ABNORMAL HIGH (ref 1.7–7.7)
Neutrophils Relative %: 76 %
Platelet Count: 158 10*3/uL (ref 150–400)
RBC: 3.83 MIL/uL — ABNORMAL LOW (ref 4.22–5.81)
RDW: 16.7 % — ABNORMAL HIGH (ref 11.5–15.5)
WBC Count: 12.2 10*3/uL — ABNORMAL HIGH (ref 4.0–10.5)
nRBC: 0.2 % (ref 0.0–0.2)

## 2021-04-12 LAB — CMP (CANCER CENTER ONLY)
ALT: 41 U/L (ref 0–44)
AST: 37 U/L (ref 15–41)
Albumin: 3.8 g/dL (ref 3.5–5.0)
Alkaline Phosphatase: 82 U/L (ref 38–126)
Anion gap: 13 (ref 5–15)
BUN: 8 mg/dL (ref 6–20)
CO2: 22 mmol/L (ref 22–32)
Calcium: 9.2 mg/dL (ref 8.9–10.3)
Chloride: 105 mmol/L (ref 98–111)
Creatinine: 0.87 mg/dL (ref 0.61–1.24)
GFR, Estimated: >60 mL/min (ref >60)
Glucose, Bld: 221 mg/dL — ABNORMAL HIGH (ref 70–99)
Potassium: 3.5 mmol/L (ref 3.5–5.1)
Sodium: 140 mmol/L (ref 135–145)
Total Bilirubin: 0.3 mg/dL (ref 0.3–1.2)
Total Protein: 6.9 g/dL (ref 6.5–8.1)

## 2021-04-12 LAB — LACTATE DEHYDROGENASE: LDH: 196 U/L — ABNORMAL HIGH (ref 98–192)

## 2021-04-12 MED ORDER — SODIUM CHLORIDE 0.9 % IV SOLN
6.0000 mg/m2 | Freq: Once | INTRAVENOUS | Status: AC
  Administered 2021-04-12: 14.6 mg via INTRAVENOUS
  Filled 2021-04-12: qty 14.6

## 2021-04-12 MED ORDER — SODIUM CHLORIDE 0.9% FLUSH
10.0000 mL | Freq: Once | INTRAVENOUS | Status: AC
  Administered 2021-04-12: 10 mL
  Filled 2021-04-12: qty 10

## 2021-04-12 MED ORDER — DOXORUBICIN HCL CHEMO IV INJECTION 2 MG/ML
25.0000 mg/m2 | Freq: Once | INTRAVENOUS | Status: AC
  Administered 2021-04-12: 62 mg via INTRAVENOUS
  Filled 2021-04-12: qty 31

## 2021-04-12 MED ORDER — PALONOSETRON HCL INJECTION 0.25 MG/5ML
INTRAVENOUS | Status: AC
  Filled 2021-04-12: qty 5

## 2021-04-12 MED ORDER — ZOLPIDEM TARTRATE 5 MG PO TABS: 5.0000 mg | ORAL_TABLET | Freq: Every evening | ORAL | 0 refills | Status: DC | PRN

## 2021-04-12 MED ORDER — SODIUM CHLORIDE 0.9% FLUSH
10.0000 mL | INTRAVENOUS | Status: DC | PRN
  Administered 2021-04-12: 10 mL
  Filled 2021-04-12: qty 10

## 2021-04-12 MED ORDER — PALONOSETRON HCL INJECTION 0.25 MG/5ML
0.2500 mg | Freq: Once | INTRAVENOUS | Status: AC
Start: 2021-04-12 — End: 2021-04-12
  Administered 2021-04-12: 0.25 mg via INTRAVENOUS

## 2021-04-12 MED ORDER — HEPARIN SOD (PORK) LOCK FLUSH 100 UNIT/ML IV SOLN
500.0000 [IU] | Freq: Once | INTRAVENOUS | Status: AC | PRN
  Administered 2021-04-12: 500 [IU]
  Filled 2021-04-12: qty 5

## 2021-04-12 MED ORDER — SODIUM CHLORIDE 0.9 % IV SOLN
10.0000 mg | Freq: Once | INTRAVENOUS | Status: AC
  Administered 2021-04-12: 10 mg via INTRAVENOUS
  Filled 2021-04-12: qty 10

## 2021-04-12 MED ORDER — SODIUM CHLORIDE 0.9 % IV SOLN
150.0000 mg | Freq: Once | INTRAVENOUS | Status: AC
  Administered 2021-04-12: 150 mg via INTRAVENOUS
  Filled 2021-04-12: qty 150

## 2021-04-12 MED ORDER — SODIUM CHLORIDE 0.9 % IV SOLN
Freq: Once | INTRAVENOUS | Status: AC
  Filled 2021-04-12: qty 250

## 2021-04-12 MED ORDER — SODIUM CHLORIDE 0.9 % IV SOLN
375.0000 mg/m2 | Freq: Once | INTRAVENOUS | Status: AC
  Administered 2021-04-12: 920 mg via INTRAVENOUS
  Filled 2021-04-12: qty 92

## 2021-04-12 NOTE — Progress Notes (Signed)
Infusion 04/12/2021.  Per Dr Lorenso Courier ok to use December 2021 Echo for todays treatment.  No need to schedule another unless the patient becomes symptomatic.

## 2021-04-12 NOTE — Patient Instructions (Signed)
[facility]  Discharge Instructions: Thank you for choosing [facility] to provide your oncology and hematology care.   If you have a lab appointment with the [facility], please go directly to the [facility] and check in at the registration area.   Wear comfortable clothing and clothing appropriate for easy access to any Portacath or PICC line.   We strive to give you quality time with your provider. You may need to reschedule your appointment if you arrive late (15 or more minutes).  Arriving late affects you and other patients whose appointments are after yours.  Also, if you miss three or more appointments without notifying the office, you may be dismissed from the clinic at the provider's discretion.      For prescription refill requests, have your pharmacy contact our office and allow 72 hours for refills to be completed.    Today you received the following chemotherapy and/or immunotherapy agents: Adriamycin, Vinblastine, DTIC    To help prevent nausea and vomiting after your treatment, we encourage you to take your nausea medication as directed.  BELOW ARE SYMPTOMS THAT SHOULD BE REPORTED IMMEDIATELY: . *FEVER GREATER THAN 100.4 F (38 C) OR HIGHER . *CHILLS OR SWEATING . *NAUSEA AND VOMITING THAT IS NOT CONTROLLED WITH YOUR NAUSEA MEDICATION . *UNUSUAL SHORTNESS OF BREATH . *UNUSUAL BRUISING OR BLEEDING . *URINARY PROBLEMS (pain or burning when urinating, or frequent urination) . *BOWEL PROBLEMS (unusual diarrhea, constipation, pain near the anus) . TENDERNESS IN MOUTH AND THROAT WITH OR WITHOUT PRESENCE OF ULCERS (sore throat, sores in mouth, or a toothache) . UNUSUAL RASH, SWELLING OR PAIN  . UNUSUAL VAGINAL DISCHARGE OR ITCHING   Items with * indicate a potential emergency and should be followed up as soon as possible or go to the Emergency Department if any problems should occur.  Please show the CHEMOTHERAPY ALERT CARD or  IMMUNOTHERAPY ALERT CARD at check-in to the Emergency Department and triage nurse.  Should you have questions after your visit or need to cancel or reschedule your appointment, please contact [facility]  Dept: [phone]  and follow the prompts.  Office hours are 8:00 a.m. to 4:30 p.m. Monday - Friday. Please note that voicemails left after 4:00 p.m. may not be returned until the following business day.  We are closed weekends and major holidays. You have access to a nurse at all times for urgent questions. Please call the main number to the clinic Dept: [phone] and follow the prompts.   For any non-urgent questions, you may also contact your provider using MyChart. We now offer e-Visits for anyone 46 and older to request care online for non-urgent symptoms. For details visit [email or web address].   Also download the MyChart app! Go to the app store, search "MyChart", open the app, select [facility], and log in with your MyChart username and password.  Due to Covid, a mask is required upon entering the hospital/clinic. If you do not have a mask, one will be given to you upon arrival. For doctor visits, patients may have 1 support person aged 9 or older with them. For treatment visits, patients cannot have anyone with them due to current Covid guidelines and our immunocompromised population.

## 2021-04-12 NOTE — Progress Notes (Signed)
[place] [phone]   [phone]  PROGRESS NOTE  Patient Care Team: London Pepper, MD as PCP - General (Family Medicine)  Hematological/Oncological History # Classical Hodgkin's Lymphoma, Mixed Cellularity. 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 2) 10/27/2020: resection of neck mass. Pathology showed classical Hodgkin's lymphoma, mixed cellularity subtype 3) 11/13/2020: establish care with Dr. Lorenso Courier  4) 11/26/2020: PET CT scan shows single hypermetabolic unenlarged right level II cervical lymph node identified on today's study. FDG uptake is compatible with [place] 4. Other small bilateral cervical and upper normal to borderline hepatoduodenal ligament lymph nodes show De[place]ville 2 uptake levels 5) 12/18/2020: Cycle 1 Day 1 of AVD chemotherapy  6) 01/15/2021: Cycle 2 Day 1 of AVD chemotherapy  7) 02/12/2021: PET CT scan shows no evidence of residual disease 8) 02/15/2021: Cycle 3 Day 1 of AVD chemotherapy  9) 03/01/2021: Cycle 3 Day 15 of AVD chemotherapy  10) 03/15/2021: Cycle 4 Day 1 of AVD chemotherapy  11) 03/29/2021: Cycle 4 Day 15 of AVD chemotherapy 12) 04/12/2021: Cycle 5 Day 1 of AVD chemotherapy  Interval History:  [name] 60 y.o. male with medical history significant for Classical Hodgkin's lymphoma Early Stage/Unfavorable risk who presents for a follow up visit. The patient's last visit was on 03/29/2021. In the interim since the last visit he has continued to tolerate treatment well.   On exam today [name] reports he has been well overall in the interim since her last visit.  He notes that he continues his trend of having a full week to recover after his chemotherapy.  He notes that more recently has been having more more difficulty with sleeping at night that first week after chemotherapy.  He notes that he feels like he is "well and uptight"  and spends most the night sitting up watching movies.  He is desiring something to sleep.  He has he has been taking Tylenol PM's but this has not been doing the trick.  His weight has been steady and his appetite has been good.  He is willing and able to proceed with chemotherapy at this time.  Patient denies any fevers, chills, night sweats, shortness of breath, chest pain or cough.  He has no other complaints. A full 10 point ROS is listed below.  MEDICAL HISTORY:  Past Medical History:  Diagnosis Date  . Cancer ([facility]) 10/15   recent dx prostate ca-no tx yet  . GERD (gastroesophageal reflux disease)   . Hypertension   . Pneumonia    history  . Teeth decayed     SURGICAL HISTORY: Past Surgical History:  Procedure Laterality Date  . IR IMAGING GUIDED PORT INSERTION  12/11/2020  . MASS BIOPSY Left 09/29/2014   Procedure: LEFT LOWER NECK MASS EXCISION;  Surgeon: Izora Gala, MD;  Location: [facility];  Service: ENT;  Laterality: Left;  . PAROTIDECTOMY Left 09/29/2014   Procedure: LEFT PAROTIDECTOMY;  Surgeon: Izora Gala, MD;  Location: [facility];  Service: ENT;  Laterality: Left;  . SEPTOPLASTY  1976    SOCIAL HISTORY: Social History   Socioeconomic History  . Marital status: Widowed    Spouse name: Not on file  . Number of children: Not on file  . Years of education: Not on file  . Highest education level: Not on file  Occupational History  . Not on file  Tobacco Use  . Smoking status:  Former Smoker    Packs/day: 3.00    Years: 32.00    Pack years: 96.00    Types: Cigarettes    Quit date: 11/26/2017    Years since quitting: 3.3  . Smokeless tobacco: Never Used  Vaping Use  . Vaping Use: Never used  Substance and Sexual Activity  . Alcohol use: Yes    Comment: twice a year  . Drug use: No  . Sexual activity: Not on file  Other Topics Concern  . Not on file  Social History Narrative  . Not on file   Social Determinants of Health    Financial Resource Strain: Not on file  Food Insecurity: Not on file  Transportation Needs: Not on file  Physical Activity: Not on file  Stress: Not on file  Social Connections: Not on file  Intimate Partner Violence: Not on file    FAMILY HISTORY: Family History  Problem Relation Age of Onset  . Stroke Mother   . Heart disease Father     ALLERGIES:  has No Known Allergies.  MEDICATIONS:  Current Outpatient Medications  Medication Sig Dispense Refill  . acetaminophen (TYLENOL) 500 MG tablet Take 500 mg by mouth every 6 (six) hours as needed for headache.    Marland Kitchen amLODipine (NORVASC) 5 MG tablet Take 5 mg by mouth every evening.    . B Complex-Folic Acid (B COMPLEX FORMULA 1, W/ FA,) TABS Take 1 tablet by mouth daily. 30 tablet 3  . lidocaine-prilocaine (EMLA) cream Apply 1 application topically as needed. (Patient taking differently: Apply 1 application topically as needed (access port).) 30 g 0  . losartan (COZAAR) 100 MG tablet Take 100 mg by mouth every evening.    . metFORMIN (GLUCOPHAGE) 500 MG tablet Take 1 tablet by mouth 2 (two) times daily.    . ondansetron (ZOFRAN) 8 MG tablet Take 1 tablet (8 mg total) by mouth every 8 (eight) hours as needed for nausea or vomiting. 39 tablet 0  . pantoprazole (PROTONIX) 40 MG tablet Take 1 tablet by mouth every evening.    . prochlorperazine (COMPAZINE) 10 MG tablet Take 1 tablet (10 mg total) by mouth every 6 (six) hours as needed for nausea or vomiting. 30 tablet 0  . rosuvastatin (CRESTOR) 5 MG tablet Take 5 mg by mouth every evening.    . zolpidem (AMBIEN) 5 MG tablet Take 1 tablet (5 mg total) by mouth at bedtime as needed for sleep. 15 tablet 0   No current facility-administered medications for this visit.   Facility-Administered Medications Ordered in Other Visits  Medication Dose Route Frequency Provider Last Rate Last Admin  . 0.9 %  sodium chloride infusion   Intravenous Continuous Allred, Darrell K, PA-C        REVIEW  OF SYSTEMS:   Constitutional: ( - ) fevers, ( - )  chills , ( - ) night sweats Eyes: ( - ) blurriness of vision, ( - ) double vision, ( - ) watery eyes Ears, nose, mouth, throat, and face: ( - ) mucositis, ( - ) sore throat Respiratory: ( - ) cough, ( - ) dyspnea, ( - ) wheezes Cardiovascular: ( - ) palpitation, ( - ) chest discomfort, ( - ) lower extremity swelling Gastrointestinal:  ( - ) nausea, ( - ) heartburn, ( - ) change in bowel habits Skin: ( - ) abnormal skin rashes Lymphatics: ( - ) new lymphadenopathy, ( - ) easy bruising Neurological: ( - ) numbness, ( - ) tingling, ( - )  new weaknesses Behavioral/Psych: ( - ) mood change, ( - ) new changes  All other systems were reviewed with the patient and are negative.  PHYSICAL EXAMINATION: ECOG PERFORMANCE STATUS: 1 - Symptomatic but completely ambulatory  Vitals:   04/12/21 1116  BP: (!) 146/73  Pulse: 94  Resp: 13  Temp: 98.7 F (37.1 C)  SpO2: 98%   Filed Weights   04/12/21 1116  Weight: 254 lb 12.8 oz (115.6 kg)    GENERAL: well appearing middle aged Caucasian male in NAD SKIN: skin color, texture, turgor are normal, no rashes or significant lesions EYES: conjunctiva are pink and non-injected, sclera clear LUNGS: clear to auscultation and percussion with normal breathing effort HEART: regular rate & rhythm and no murmurs and no lower extremity edema Musculoskeletal: no cyanosis of digits and no clubbing  PSYCH: alert & oriented x 3, fluent speech NEURO: no focal motor/sensory deficits  LABORATORY DATA:  I have reviewed the data as listed CBC Latest Ref Rng & Units 04/12/2021 03/29/2021 03/15/2021  WBC 4.0 - 10.5 K/uL 12.2(H) 12.1(H) 9.6  Hemoglobin 13.0 - 17.0 g/dL 11.5(L) 12.2(L) 12.9(L)  Hematocrit 39.0 - 52.0 % 34.9(L) 37.0(L) 38.6(L)  Platelets 150 - 400 K/uL 158 176 134(L)    CMP Latest Ref Rng & Units 04/12/2021 03/29/2021 03/15/2021  Glucose 70 - 99 mg/dL 221(H) 254(H) 194(H)  BUN 6 - 20 mg/dL _0 Creatinine 0.61 - 1.24 mg/dL 0.87 0.93 0.85  Sodium 135 - 145 mmol/L 140 139 139  Potassium 3.5 - 5.1 mmol/L 3.5 3.8 3.7  Chloride 98 - 111 mmol/L 105 104 104  CO2 22 - 32 mmol/L _1 Calcium 8.9 - 10.3 mg/dL 9.2 8.9 9.3  Total Protein 6.5 - 8.1 g/dL 6.9 7.1 7.3  Total Bilirubin 0.3 - 1.2 mg/dL 0.3 0.4 0.3  Alkaline Phos 38 - 126 U/L 82 81 79  AST 15 - 41 U/L 37 41 37  ALT 0 - 44 U/L 41 48(H) 45(H)    No results found for: MPROTEIN No results found for: KPAFRELGTCHN, LAMBDASER, KAPLAMBRATIO   RADIOGRAPHIC STUDIES:  No results found.  ASSESSMENT & PLAN [name] 60 y.o. male with medical history significant for Classical Hodgkin's lymphoma Early Stage/Unfavorable risk who presents for a follow up visit.  After review the labs, the records, review of the PFTs, and review the echocardiogram the findings are most consistent with an early stage/unfavorable risk classical Hodgkin's lymphoma.  Given that he meets unfavorable criteria via multiple guidelines I recommended that we proceed with treatment as recommended in the NCCN guidelines for patients with early stage unfavorable disease (which is the exact same treatment recommended for stage III-IV disease).  Previously we discussed the AVD chemotherapy regimen.  We discussed the expected side effects including possible hair loss, nausea, vomiting, diarrhea, constipation, cytopenias, neutropenia, cardiac dysfunction, and neurological damage.  The patient will be treated with every 2 week rounds of chemotherapy using this regimen. AVD chemotherapy consists of doxorubicin 25 mg/m on days 1 and 15, vinblastine 6 mg per metered squared IV on days 1 and 15, and dacarbazine 375 mg/m IV on days 1 and 15.  After 2 cycles of AVD chemotherapy, PET/CT scan from 02/12/2021 revealed complete response to therapy with no evidence of lymphoma. The recommendation is to proceed with a further 4 cycles of AVD therapy.   # Classical Hodgkin's Lymphoma,  Mixed Cellularity. Early Stage, Unfavorable Risk  --findings are consistent with an Early stage unfavorable risk  disease (due to age, ESR elevation, and mixed cellularity based on EORTC, GHSG, and ECGO criteria) --his disease appears early stage with one clear site of involvement in the neck. There are some smaller lymph nodes in the neck and one near the hepatoduodenal ligament, though would favor diagnosis of early stage --based on these criteria the recommendation would be for AVD x 2 cycles with interval PET CT scan followed by AVD x 4 cycles.  --interval PET on 02/12/2021 showed complete response to therapy --today is Cycle 5 Day 1 of AVD chemotherapy.  --will avoid bleomycin in this patient due to smoking history, mild abnormalities on PFTs and age --RTC for Cycle 5 Day 15 of treatment in 2 weeks   #Insomnia -- Occurs predominantly during his first week of chemotherapy. -- Patient is been trying Tylenol PM without relief. -- We will prescribe Ambien 5 mg p.o. daily with a short 2-week supply -- Continue to monitor.  #Neutropenia, Severe --expected with this chemotherapy regimen. The use of GCSF with this chemotherapy is not routinely recommended as these patients rarely get neutropenic fever despite low ANCs --however, this patient had an episode of febrile neutropenia. Will pursue GCSF therapy following his treatment.   #Supportive Care --chemotherapy education to be scheduled  --zofran 32m q8H PRN and compazine 132mPO q6H for nausea -- EMLA cream for port -- no pain medication required at this time.   #Gum soreness: --uncertain etiology but physical exam did no reveal any gross evidence of infection. --Recommended a follow up with dentist for further workup.   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 No  biopsy recommended.  --continue to monitor on future scans.  No orders of the defined types were placed in this encounter.  All questions were answered. The patient knows to call the clinic with any problems, questions or concerns.  A total of more than 30 minutes were spent on this encounter and over half of that time was spent on counseling and coordination of care as outlined above.   JoLedell PeoplesMD Department of Hematology/Oncology [facility] [place]: [phone]: [phone]: [email or web address]  04/12/2021 11:42 AM

## 2021-04-14 ENCOUNTER — Other Ambulatory Visit: Payer: Self-pay

## 2021-04-14 ENCOUNTER — Inpatient Hospital Stay: Payer: BC Managed Care – PPO

## 2021-04-14 VITALS — BP 106/83 | HR 101 | Resp 20

## 2021-04-14 DIAGNOSIS — D701 Agranulocytosis secondary to cancer chemotherapy: Secondary | ICD-10-CM | POA: Diagnosis not present

## 2021-04-14 DIAGNOSIS — Z5111 Encounter for antineoplastic chemotherapy: Secondary | ICD-10-CM | POA: Diagnosis not present

## 2021-04-14 DIAGNOSIS — Z79899 Other long term (current) drug therapy: Secondary | ICD-10-CM | POA: Diagnosis not present

## 2021-04-14 DIAGNOSIS — C8121 Mixed cellularity classical Hodgkin lymphoma, lymph nodes of head, face, and neck: Secondary | ICD-10-CM

## 2021-04-14 DIAGNOSIS — Z8249 Family history of ischemic heart disease and other diseases of the circulatory system: Secondary | ICD-10-CM | POA: Diagnosis not present

## 2021-04-14 DIAGNOSIS — Z5189 Encounter for other specified aftercare: Secondary | ICD-10-CM | POA: Diagnosis not present

## 2021-04-14 DIAGNOSIS — D709 Neutropenia, unspecified: Secondary | ICD-10-CM

## 2021-04-14 DIAGNOSIS — Z8546 Personal history of malignant neoplasm of prostate: Secondary | ICD-10-CM | POA: Diagnosis not present

## 2021-04-14 DIAGNOSIS — Z95828 Presence of other vascular implants and grafts: Secondary | ICD-10-CM

## 2021-04-14 DIAGNOSIS — I10 Essential (primary) hypertension: Secondary | ICD-10-CM | POA: Diagnosis not present

## 2021-04-14 DIAGNOSIS — T451X5D Adverse effect of antineoplastic and immunosuppressive drugs, subsequent encounter: Secondary | ICD-10-CM | POA: Diagnosis not present

## 2021-04-14 DIAGNOSIS — Z87891 Personal history of nicotine dependence: Secondary | ICD-10-CM | POA: Diagnosis not present

## 2021-04-14 DIAGNOSIS — Z7984 Long term (current) use of oral hypoglycemic drugs: Secondary | ICD-10-CM | POA: Diagnosis not present

## 2021-04-14 DIAGNOSIS — Z1501 Genetic susceptibility to malignant neoplasm of breast: Secondary | ICD-10-CM | POA: Diagnosis not present

## 2021-04-14 DIAGNOSIS — G4700 Insomnia, unspecified: Secondary | ICD-10-CM | POA: Diagnosis not present

## 2021-04-14 MED ORDER — PEGFILGRASTIM-CBQV 6 MG/0.6ML ~~LOC~~ SOSY
PREFILLED_SYRINGE | SUBCUTANEOUS | Status: AC
  Filled 2021-04-14: qty 0.6

## 2021-04-14 MED ORDER — PEGFILGRASTIM-CBQV 6 MG/0.6ML ~~LOC~~ SOSY
6.0000 mg | PREFILLED_SYRINGE | Freq: Once | SUBCUTANEOUS | Status: AC
  Administered 2021-04-14: 6 mg via SUBCUTANEOUS

## 2021-04-14 MED ORDER — SODIUM CHLORIDE 0.9% FLUSH
10.0000 mL | Freq: Once | INTRAVENOUS | Status: DC
  Filled 2021-04-14: qty 10

## 2021-04-14 NOTE — Patient Instructions (Signed)
Pegfilgrastim injection What is this medicine? PEGFILGRASTIM (PEG fil gra stim) is a long-acting granulocyte colony-stimulating factor that stimulates the growth of neutrophils, a type of white blood cell important in the body's fight against infection. It is used to reduce the incidence of fever and infection in patients with certain types of cancer who are receiving chemotherapy that affects the bone marrow, and to increase survival after being exposed to high doses of radiation. This medicine may be used for other purposes; ask your health care provider or pharmacist if you have questions. COMMON BRAND NAME(S): Fulphila, Neulasta, Nyvepria, UDENYCA, Ziextenzo What should I tell my health care provider before I take this medicine? They need to know if you have any of these conditions:  kidney disease  latex allergy  ongoing radiation therapy  sickle cell disease  skin reactions to acrylic adhesives (On-Body Injector only)  an unusual or allergic reaction to pegfilgrastim, filgrastim, other medicines, foods, dyes, or preservatives  pregnant or trying to get pregnant  breast-feeding How should I use this medicine? This medicine is for injection under the skin. If you get this medicine at home, you will be taught how to prepare and give the pre-filled syringe or how to use the On-body Injector. Refer to the patient Instructions for Use for detailed instructions. Use exactly as directed. Tell your healthcare provider immediately if you suspect that the On-body Injector may not have performed as intended or if you suspect the use of the On-body Injector resulted in a missed or partial dose. It is important that you put your used needles and syringes in a special sharps container. Do not put them in a trash can. If you do not have a sharps container, call your pharmacist or healthcare provider to get one. Talk to your pediatrician regarding the use of this medicine in children. While this drug  may be prescribed for selected conditions, precautions do apply. Overdosage: If you think you have taken too much of this medicine contact a poison control center or emergency room at once. NOTE: This medicine is only for you. Do not share this medicine with others. What if I miss a dose? It is important not to miss your dose. Call your doctor or health care professional if you miss your dose. If you miss a dose due to an On-body Injector failure or leakage, a new dose should be administered as soon as possible using a single prefilled syringe for manual use. What may interact with this medicine? Interactions have not been studied. This list may not describe all possible interactions. Give your health care provider a list of all the medicines, herbs, non-prescription drugs, or dietary supplements you use. Also tell them if you smoke, drink alcohol, or use illegal drugs. Some items may interact with your medicine. What should I watch for while using this medicine? Your condition will be monitored carefully while you are receiving this medicine. You may need blood work done while you are taking this medicine. Talk to your health care provider about your risk of cancer. You may be more at risk for certain types of cancer if you take this medicine. If you are going to need a MRI, CT scan, or other procedure, tell your doctor that you are using this medicine (On-Body Injector only). What side effects may I notice from receiving this medicine? Side effects that you should report to your doctor or health care professional as soon as possible:  allergic reactions (skin rash, itching or hives, swelling of   the face, lips, or tongue)  back pain  dizziness  fever  pain, redness, or irritation at site where injected  pinpoint red spots on the skin  red or dark-brown urine  shortness of breath or breathing problems  stomach or side pain, or pain at the shoulder  swelling  tiredness  trouble  passing urine or change in the amount of urine  unusual bruising or bleeding Side effects that usually do not require medical attention (report to your doctor or health care professional if they continue or are bothersome):  bone pain  muscle pain This list may not describe all possible side effects. Call your doctor for medical advice about side effects. You may report side effects to FDA at 1-800-FDA-1088. Where should I keep my medicine? Keep out of the reach of children. If you are using this medicine at home, you will be instructed on how to store it. Throw away any unused medicine after the expiration date on the label. NOTE: This sheet is a summary. It may not cover all possible information. If you have questions about this medicine, talk to your doctor, pharmacist, or health care provider.  2021 Elsevier/Gold Standard (2019-12-13 13:20:51)  

## 2021-04-26 ENCOUNTER — Inpatient Hospital Stay: Payer: BC Managed Care – PPO

## 2021-04-26 ENCOUNTER — Other Ambulatory Visit: Payer: Self-pay

## 2021-04-26 ENCOUNTER — Inpatient Hospital Stay (HOSPITAL_BASED_OUTPATIENT_CLINIC_OR_DEPARTMENT_OTHER): Payer: BC Managed Care – PPO | Admitting: Physician Assistant

## 2021-04-26 ENCOUNTER — Other Ambulatory Visit: Payer: BC Managed Care – PPO

## 2021-04-26 VITALS — BP 110/68 | HR 99 | Temp 98.9°F | Resp 20 | Ht 70.0 in | Wt 252.5 lb

## 2021-04-26 DIAGNOSIS — G4700 Insomnia, unspecified: Secondary | ICD-10-CM

## 2021-04-26 DIAGNOSIS — Z87891 Personal history of nicotine dependence: Secondary | ICD-10-CM | POA: Diagnosis not present

## 2021-04-26 DIAGNOSIS — Z5189 Encounter for other specified aftercare: Secondary | ICD-10-CM | POA: Diagnosis not present

## 2021-04-26 DIAGNOSIS — Z5111 Encounter for antineoplastic chemotherapy: Secondary | ICD-10-CM | POA: Insufficient documentation

## 2021-04-26 DIAGNOSIS — K118 Other diseases of salivary glands: Secondary | ICD-10-CM

## 2021-04-26 DIAGNOSIS — T451X5D Adverse effect of antineoplastic and immunosuppressive drugs, subsequent encounter: Secondary | ICD-10-CM | POA: Diagnosis not present

## 2021-04-26 DIAGNOSIS — C8121 Mixed cellularity classical Hodgkin lymphoma, lymph nodes of head, face, and neck: Secondary | ICD-10-CM

## 2021-04-26 DIAGNOSIS — Z95828 Presence of other vascular implants and grafts: Secondary | ICD-10-CM

## 2021-04-26 DIAGNOSIS — D701 Agranulocytosis secondary to cancer chemotherapy: Secondary | ICD-10-CM | POA: Diagnosis not present

## 2021-04-26 DIAGNOSIS — R5081 Fever presenting with conditions classified elsewhere: Secondary | ICD-10-CM

## 2021-04-26 DIAGNOSIS — D709 Neutropenia, unspecified: Secondary | ICD-10-CM

## 2021-04-26 DIAGNOSIS — Z79899 Other long term (current) drug therapy: Secondary | ICD-10-CM | POA: Diagnosis not present

## 2021-04-26 DIAGNOSIS — Z7984 Long term (current) use of oral hypoglycemic drugs: Secondary | ICD-10-CM | POA: Diagnosis not present

## 2021-04-26 DIAGNOSIS — I10 Essential (primary) hypertension: Secondary | ICD-10-CM | POA: Diagnosis not present

## 2021-04-26 DIAGNOSIS — Z8249 Family history of ischemic heart disease and other diseases of the circulatory system: Secondary | ICD-10-CM | POA: Diagnosis not present

## 2021-04-26 DIAGNOSIS — Z8546 Personal history of malignant neoplasm of prostate: Secondary | ICD-10-CM | POA: Diagnosis not present

## 2021-04-26 DIAGNOSIS — Z1501 Genetic susceptibility to malignant neoplasm of breast: Secondary | ICD-10-CM | POA: Diagnosis not present

## 2021-04-26 LAB — CMP (CANCER CENTER ONLY)
ALT: 45 U/L — ABNORMAL HIGH (ref 0–44)
AST: 40 U/L (ref 15–41)
Albumin: 3.8 g/dL (ref 3.5–5.0)
Alkaline Phosphatase: 92 U/L (ref 38–126)
Anion gap: 8 (ref 5–15)
BUN: 8 mg/dL (ref 6–20)
CO2: 26 mmol/L (ref 22–32)
Calcium: 9.3 mg/dL (ref 8.9–10.3)
Chloride: 106 mmol/L (ref 98–111)
Creatinine: 0.79 mg/dL (ref 0.61–1.24)
GFR, Estimated: >60 mL/min (ref >60)
Glucose, Bld: 171 mg/dL — ABNORMAL HIGH (ref 70–99)
Potassium: 3.8 mmol/L (ref 3.5–5.1)
Sodium: 140 mmol/L (ref 135–145)
Total Bilirubin: 0.3 mg/dL (ref 0.3–1.2)
Total Protein: 6.9 g/dL (ref 6.5–8.1)

## 2021-04-26 LAB — CBC WITH DIFFERENTIAL (CANCER CENTER ONLY)
Abs Immature Granulocytes: 1.01 10*3/uL — ABNORMAL HIGH (ref 0.00–0.07)
Basophils Absolute: 0.1 10*3/uL (ref 0.0–0.1)
Basophils Relative: 1 %
Eosinophils Absolute: 0.2 10*3/uL (ref 0.0–0.5)
Eosinophils Relative: 1 %
HCT: 36.2 % — ABNORMAL LOW (ref 39.0–52.0)
Hemoglobin: 11.7 g/dL — ABNORMAL LOW (ref 13.0–17.0)
Immature Granulocytes: 6 %
Lymphocytes Relative: 9 %
Lymphs Abs: 1.5 10*3/uL (ref 0.7–4.0)
MCH: 30.2 pg (ref 26.0–34.0)
MCHC: 32.3 g/dL (ref 30.0–36.0)
MCV: 93.3 fL (ref 80.0–100.0)
Monocytes Absolute: 1 10*3/uL (ref 0.1–1.0)
Monocytes Relative: 6 %
Neutro Abs: 12.7 10*3/uL — ABNORMAL HIGH (ref 1.7–7.7)
Neutrophils Relative %: 77 %
Platelet Count: 174 10*3/uL (ref 150–400)
RBC: 3.88 MIL/uL — ABNORMAL LOW (ref 4.22–5.81)
RDW: 16.6 % — ABNORMAL HIGH (ref 11.5–15.5)
WBC Count: 16.4 10*3/uL — ABNORMAL HIGH (ref 4.0–10.5)
nRBC: 0 % (ref 0.0–0.2)

## 2021-04-26 LAB — LACTATE DEHYDROGENASE: LDH: 182 U/L (ref 98–192)

## 2021-04-26 MED ORDER — SODIUM CHLORIDE 0.9% FLUSH
10.0000 mL | Freq: Once | INTRAVENOUS | Status: AC
Start: 2021-04-26 — End: 2021-04-26
  Administered 2021-04-26: 10 mL
  Filled 2021-04-26: qty 10

## 2021-04-26 MED ORDER — SODIUM CHLORIDE 0.9 % IV SOLN
Freq: Once | INTRAVENOUS | Status: AC
  Filled 2021-04-26: qty 250

## 2021-04-26 MED ORDER — VINBLASTINE SULFATE CHEMO INJECTION 1 MG/ML
6.0000 mg/m2 | Freq: Once | INTRAVENOUS | Status: AC
  Administered 2021-04-26: 14.6 mg via INTRAVENOUS
  Filled 2021-04-26: qty 14.6

## 2021-04-26 MED ORDER — DOXORUBICIN HCL CHEMO IV INJECTION 2 MG/ML
25.0000 mg/m2 | Freq: Once | INTRAVENOUS | Status: AC
Start: 2021-04-26 — End: 2021-04-26
  Administered 2021-04-26: 62 mg via INTRAVENOUS
  Filled 2021-04-26: qty 31

## 2021-04-26 MED ORDER — PALONOSETRON HCL INJECTION 0.25 MG/5ML
0.2500 mg | Freq: Once | INTRAVENOUS | Status: AC
Start: 2021-04-26 — End: 2021-04-26
  Administered 2021-04-26: 0.25 mg via INTRAVENOUS

## 2021-04-26 MED ORDER — PALONOSETRON HCL INJECTION 0.25 MG/5ML
INTRAVENOUS | Status: AC
  Filled 2021-04-26: qty 5

## 2021-04-26 MED ORDER — SODIUM CHLORIDE 0.9 % IV SOLN
150.0000 mg | Freq: Once | INTRAVENOUS | Status: AC
  Administered 2021-04-26: 150 mg via INTRAVENOUS
  Filled 2021-04-26: qty 150

## 2021-04-26 MED ORDER — SODIUM CHLORIDE 0.9 % IV SOLN
375.0000 mg/m2 | Freq: Once | INTRAVENOUS | Status: AC
  Administered 2021-04-26: 920 mg via INTRAVENOUS
  Filled 2021-04-26: qty 92

## 2021-04-26 MED ORDER — SODIUM CHLORIDE 0.9 % IV SOLN
10.0000 mg | Freq: Once | INTRAVENOUS | Status: AC
  Administered 2021-04-26: 10 mg via INTRAVENOUS
  Filled 2021-04-26: qty 10

## 2021-04-26 MED ORDER — HEPARIN SOD (PORK) LOCK FLUSH 100 UNIT/ML IV SOLN
500.0000 [IU] | Freq: Once | INTRAVENOUS | Status: AC | PRN
  Administered 2021-04-26: 500 [IU]
  Filled 2021-04-26: qty 5

## 2021-04-26 MED ORDER — SODIUM CHLORIDE 0.9% FLUSH
10.0000 mL | INTRAVENOUS | Status: DC | PRN
  Administered 2021-04-26: 10 mL
  Filled 2021-04-26: qty 10

## 2021-04-26 NOTE — Progress Notes (Signed)
[place] Telephone:([phone]) [phone]   Fax:([phone]) [phone]  PROGRESS NOTE  Patient Care Team: London Pepper, MD as PCP - General (Family Medicine)  Hematological/Oncological History # Classical Hodgkin's Lymphoma, Mixed Cellularity. 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 2) 10/27/2020: resection of neck mass. Pathology showed classical Hodgkin's lymphoma, mixed cellularity subtype 3) 11/13/2020: establish care with Dr. Lorenso Courier  4) 11/26/2020: PET CT scan shows single hypermetabolic unenlarged right level II cervical lymph node identified on today's study. FDG uptake is compatible with [place] 4. Other small bilateral cervical and upper normal to borderline hepatoduodenal ligament lymph nodes show Deauville 2 uptake levels 5) 12/18/2020: Cycle 1 Day 1 of AVD chemotherapy  6) 01/15/2021: Cycle 2 Day 1 of AVD chemotherapy  7) 02/12/2021: PET CT scan shows no evidence of residual disease 8) 02/15/2021: Cycle 3 Day 1 of AVD chemotherapy  9) 03/01/2021: Cycle 3 Day 15 of AVD chemotherapy  10) 03/15/2021: Cycle 4 Day 1 of AVD chemotherapy  11) 03/29/2021: Cycle 4 Day 15 of AVD chemotherapy 12) 04/12/2021: Cycle 5 Day 1 of AVD chemotherapy 13) 04/26/2021: Cycle 5 Day 15 of AVD chemotherapy  Interval History:  [name] 60 y.o. male with medical history significant for Classical Hodgkin's lymphoma Early Stage/Unfavorable risk who presents for a follow up visit. The patient's last visit was on 04/12/2021. In the interim since the last visit he has continued to tolerate treatment well.   On exam today Mr. [name] reports he has been well overall in the interim since her last visit. He reports that his energy levels are fairly stable. He notes fatigue and decreased appetite for approximately one week after chemotherapy. He continues to have insomnia the first week after chemotherapy. He was prescribed  Ambien 5 mg which he takes 1-2 tablets before bedtime. He has noticed some improvement with Ambien but continues to wake up 2-3 hours after falling asleep. Patient reports watching TV prior to bedtime and leaves the TV on while asleep. He denies any nausea, vomiting or abdominal pain. His bowel movements are regular without any constipation or diarrhea. Patient denies easy bruising or signs of bleeding. Patient denies any fevers, chills, night sweats, changes in shortness of breath, chest pain or cough.  He has no other complaints. A full 10 point ROS is listed below.  MEDICAL HISTORY:  Past Medical History:  Diagnosis Date  . Cancer ([facility]) 10/15   recent dx prostate ca-no tx yet  . GERD (gastroesophageal reflux disease)   . Hypertension   . Pneumonia    history  . Teeth decayed     SURGICAL HISTORY: Past Surgical History:  Procedure Laterality Date  . IR IMAGING GUIDED PORT INSERTION  12/11/2020  . MASS BIOPSY Left 09/29/2014   Procedure: LEFT LOWER NECK MASS EXCISION;  Surgeon: Izora Gala, MD;  Location: [facility];  Service: ENT;  Laterality: Left;  . PAROTIDECTOMY Left 09/29/2014   Procedure: LEFT PAROTIDECTOMY;  Surgeon: Izora Gala, MD;  Location: [facility];  Service: ENT;  Laterality: Left;  . SEPTOPLASTY  1976    SOCIAL HISTORY: Social History   Socioeconomic History  . Marital status: Widowed    Spouse name: Not on file  . Number of children: Not on file  . Years of education: Not on file  . Highest education level: Not on file  Occupational History  . Not on file  Tobacco Use  .  Smoking status: Former Smoker    Packs/day: 3.00    Years: 32.00    Pack years: 96.00    Types: Cigarettes    Quit date: 11/26/2017    Years since quitting: 3.4  . Smokeless tobacco: Never Used  Vaping Use  . Vaping Use: Never used  Substance and Sexual Activity  . Alcohol use: Yes    Comment: twice a year  . Drug use: No  . Sexual activity: Not on file   Other Topics Concern  . Not on file  Social History Narrative  . Not on file   Social Determinants of Health   Financial Resource Strain: Not on file  Food Insecurity: Not on file  Transportation Needs: Not on file  Physical Activity: Not on file  Stress: Not on file  Social Connections: Not on file  Intimate Partner Violence: Not on file    FAMILY HISTORY: Family History  Problem Relation Age of Onset  . Stroke Mother   . Heart disease Father     ALLERGIES:  has No Known Allergies.  MEDICATIONS:  Current Outpatient Medications  Medication Sig Dispense Refill  . acetaminophen (TYLENOL) 500 MG tablet Take 500 mg by mouth every 6 (six) hours as needed for headache.    Marland Kitchen amLODipine (NORVASC) 5 MG tablet Take 5 mg by mouth every evening.    . B Complex-Folic Acid (B COMPLEX FORMULA 1, W/ FA,) TABS Take 1 tablet by mouth daily. 30 tablet 3  . lidocaine-prilocaine (EMLA) cream Apply 1 application topically as needed. (Patient taking differently: Apply 1 application topically as needed (access port).) 30 g 0  . losartan (COZAAR) 100 MG tablet Take 100 mg by mouth every evening.    . metFORMIN (GLUCOPHAGE) 500 MG tablet Take 1 tablet by mouth 2 (two) times daily.    . ondansetron (ZOFRAN) 8 MG tablet Take 1 tablet (8 mg total) by mouth every 8 (eight) hours as needed for nausea or vomiting. 39 tablet 0  . pantoprazole (PROTONIX) 40 MG tablet Take 1 tablet by mouth every evening.    . prochlorperazine (COMPAZINE) 10 MG tablet Take 1 tablet (10 mg total) by mouth every 6 (six) hours as needed for nausea or vomiting. 30 tablet 0  . rosuvastatin (CRESTOR) 5 MG tablet Take 5 mg by mouth every evening.    . zolpidem (AMBIEN) 5 MG tablet Take 1 tablet (5 mg total) by mouth at bedtime as needed for sleep. 15 tablet 0   No current facility-administered medications for this visit.   Facility-Administered Medications Ordered in Other Visits  Medication Dose Route Frequency Provider Last  Rate Last Admin  . 0.9 %  sodium chloride infusion   Intravenous Continuous Allred, Darrell K, PA-C        REVIEW OF SYSTEMS:   Constitutional: ( - ) fevers, ( - )  chills , ( - ) night sweats Eyes: ( - ) blurriness of vision, ( - ) double vision, ( - ) watery eyes Ears, nose, mouth, throat, and face: ( - ) mucositis, ( - ) sore throat Respiratory: ( - ) cough, ( - ) dyspnea, ( - ) wheezes Cardiovascular: ( - ) palpitation, ( - ) chest discomfort, ( - ) lower extremity swelling Gastrointestinal:  ( - ) nausea, ( - ) heartburn, ( - ) change in bowel habits Skin: ( - ) abnormal skin rashes Lymphatics: ( - ) new lymphadenopathy, ( - ) easy bruising Neurological: ( - ) numbness, ( - )  tingling, ( - ) new weaknesses Behavioral/Psych: ( - ) mood change, ( - ) new changes  All other systems were reviewed with the patient and are negative.  PHYSICAL EXAMINATION: ECOG PERFORMANCE STATUS: 1 - Symptomatic but completely ambulatory  Vitals:   04/26/21 1047  BP: 110/68  Pulse: 99  Resp: 20  Temp: 98.9 F (37.2 C)  SpO2: 98%   Filed Weights   04/26/21 1047  Weight: 252 lb 8 oz (114.5 kg)    GENERAL: well appearing middle aged Caucasian male in NAD SKIN: skin color, texture, turgor are normal, no rashes or significant lesions EYES: conjunctiva are pink and non-injected, sclera clear LUNGS: clear to auscultation and percussion with normal breathing effort HEART: regular rate & rhythm and no murmurs and no lower extremity edema Musculoskeletal: no cyanosis of digits and no clubbing  PSYCH: alert & oriented x 3, fluent speech NEURO: no focal motor/sensory deficits  LABORATORY DATA:  I have reviewed the data as listed CBC Latest Ref Rng & Units 04/26/2021 04/12/2021 03/29/2021  WBC 4.0 - 10.5 K/uL 16.4(H) 12.2(H) 12.1(H)  Hemoglobin 13.0 - 17.0 g/dL 11.7(L) 11.5(L) 12.2(L)  Hematocrit 39.0 - 52.0 % 36.2(L) 34.9(L) 37.0(L)  Platelets 150 - 400 K/uL 174 158 176    CMP Latest Ref Rng &  Units 04/26/2021 04/12/2021 03/29/2021  Glucose 70 - 99 mg/dL 171(H) 221(H) 254(H)  BUN 6 - 20 mg/dL 8 8 9  Creatinine 0.61 - 1.24 mg/dL 0.79 0.87 0.93  Sodium 135 - 145 mmol/L 140 140 139  Potassium 3.5 - 5.1 mmol/L 3.8 3.5 3.8  Chloride 98 - 111 mmol/L 106 105 104  CO2 22 - 32 mmol/L 26 22 23  Calcium 8.9 - 10.3 mg/dL 9.3 9.2 8.9  Total Protein 6.5 - 8.1 g/dL 6.9 6.9 7.1  Total Bilirubin 0.3 - 1.2 mg/dL 0.3 0.3 0.4  Alkaline Phos 38 - 126 U/L 92 82 81  AST 15 - 41 U/L 40 37 41  ALT 0 - 44 U/L 45(H) 41 48(H)    No results found for: MPROTEIN No results found for: KPAFRELGTCHN, LAMBDASER, KAPLAMBRATIO   RADIOGRAPHIC STUDIES:  No results found.  ASSESSMENT & PLAN [name] 59 y.o. male with medical history significant for Classical Hodgkin's lymphoma Early Stage/Unfavorable risk who presents for a follow up visit.  After review the labs, the records, review of the PFTs, and review the echocardiogram the findings are most consistent with an early stage/unfavorable risk classical Hodgkin's lymphoma.  Given that he meets unfavorable criteria via multiple guidelines I recommended that we proceed with treatment as recommended in the NCCN guidelines for patients with early stage unfavorable disease (which is the exact same treatment recommended for stage III-IV disease).  Previously we discussed the AVD chemotherapy regimen.  We discussed the expected side effects including possible hair loss, nausea, vomiting, diarrhea, constipation, cytopenias, neutropenia, cardiac dysfunction, and neurological damage.  The patient will be treated with every 2 week rounds of chemotherapy using this regimen. AVD chemotherapy consists of doxorubicin 25 mg/m on days 1 and 15, vinblastine 6 mg per metered squared IV on days 1 and 15, and dacarbazine 375 mg/m IV on days 1 and 15.  After 2 cycles of AVD chemotherapy, PET/CT scan from 02/12/2021 revealed complete response to therapy with no evidence of lymphoma. The  recommendation is to proceed with a further 4 cycles of AVD therapy.   # Classical Hodgkin's Lymphoma, Mixed Cellularity. 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 There are some smaller lymph nodes in the neck and one near the hepatoduodenal ligament, though would favor diagnosis of early stage --based on these criteria the recommendation would be for AVD x 2 cycles with interval PET CT scan followed by AVD x 4 cycles.  --interval PET on 02/12/2021 showed complete response to therapy --Today is Cycle 5 Day 15 of AVD chemotherapy.  Labs (CBC, CMP, LDH) from today were reviewed without any intervention needed. Patient will proceed with treatment as planned.  --will avoid bleomycin in this patient due to smoking history, mild abnormalities on PFTs and age --RTC for Cycle 6 Day 1 of treatment in 2 weeks   #Insomnia -- Occurs predominantly during his first week of chemotherapy. -- Patient is been trying Tylenol PM without relief. -- Current only Ambien 5 mg p.o. daily that he takes 1-2 tablets as needed with some improvement.  -- Advised to limit screen time 2 hours before bedtime.  -- Continue to monitor.  #Neutropenia, Severe --expected with this chemotherapy regimen. The use of GCSF with this chemotherapy is not routinely recommended as these patients rarely get neutropenic fever despite low ANCs --however, this patient had an episode of febrile neutropenia. Will pursue GCSF therapy following his treatment.   #Supportive Care --chemotherapy education to be scheduled  --zofran 33m q8H PRN and compazine 147mPO q6H for nausea -- EMLA cream for port -- no pain medication required at this time.   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 No biopsy recommended.  --continue to monitor on future scans.  Orders Placed This Encounter  Procedures  . CBC with Differential (Cancer Center Only)    Standing Status:   Future    Standing Expiration Date:   04/26/2022  . CMP ([facility])    Standing Status:   Future    Standing Expiration Date:   04/26/2022  . Lactate dehydrogenase (LDH)    Standing Status:   Future    Standing Expiration Date:   04/26/2022   All questions were answered. The patient knows to call the clinic with any problems, questions or concerns.  I have spent a total of 35 minutes minutes of face-to-face and non-face-to-face time, preparing to see the patient, obtaining and/or reviewing separately obtained history, performing a medically appropriate examination, counseling and educating the patient, ordering tests,documenting clinical information in the electronic health record and care coordination.   IrDede QueryA-C Department of Hematology/Oncology [facility]: [phone] 04/26/2021 11:25 AM

## 2021-04-26 NOTE — Patient Instructions (Signed)
[facility]   Discharge Instructions: Thank you for choosing [facility] to provide your oncology and hematology care.   If you have a lab appointment with the [facility], please go directly to the [facility] and check in at the registration area.   Wear comfortable clothing and clothing appropriate for easy access to any Portacath or PICC line.   We strive to give you quality time with your provider. You may need to reschedule your appointment if you arrive late (15 or more minutes).  Arriving late affects you and other patients whose appointments are after yours.  Also, if you miss three or more appointments without notifying the office, you may be dismissed from the clinic at the provider's discretion.      For prescription refill requests, have your pharmacy contact our office and allow 72 hours for refills to be completed.    Today you received the following chemotherapy and/or immunotherapy agents: Doxorubicin, Vinblastine, and Dacarbazine    To help prevent nausea and vomiting after your treatment, we encourage you to take your nausea medication as directed.  BELOW ARE SYMPTOMS THAT SHOULD BE REPORTED IMMEDIATELY: . *FEVER GREATER THAN 100.4 F (38 C) OR HIGHER . *CHILLS OR SWEATING . *NAUSEA AND VOMITING THAT IS NOT CONTROLLED WITH YOUR NAUSEA MEDICATION . *UNUSUAL SHORTNESS OF BREATH . *UNUSUAL BRUISING OR BLEEDING . *URINARY PROBLEMS (pain or burning when urinating, or frequent urination) . *BOWEL PROBLEMS (unusual diarrhea, constipation, pain near the anus) . TENDERNESS IN MOUTH AND THROAT WITH OR WITHOUT PRESENCE OF ULCERS (sore throat, sores in mouth, or a toothache) . UNUSUAL RASH, SWELLING OR PAIN  . UNUSUAL VAGINAL DISCHARGE OR ITCHING   Items with * indicate a potential emergency and should be followed up as soon as possible or go to the Emergency Department if any problems should occur.  Please show the CHEMOTHERAPY  ALERT CARD or IMMUNOTHERAPY ALERT CARD at check-in to the Emergency Department and triage nurse.  Should you have questions after your visit or need to cancel or reschedule your appointment, please contact [facility]  Dept: [phone]  and follow the prompts.  Office hours are 8:00 a.m. to 4:30 p.m. Monday - Friday. Please note that voicemails left after 4:00 p.m. may not be returned until the following business day.  We are closed weekends and major holidays. You have access to a nurse at all times for urgent questions. Please call the main number to the clinic Dept: [phone] and follow the prompts.   For any non-urgent questions, you may also contact your provider using MyChart. We now offer e-Visits for anyone 73 and older to request care online for non-urgent symptoms. For details visit [email or web address].   Also download the MyChart app! Go to the app store, search "MyChart", open the app, select [facility], and log in with your MyChart username and password.  Due to Covid, a mask is required upon entering the hospital/clinic. If you do not have a mask, one will be given to you upon arrival. For doctor visits, patients may have 1 support person aged 6 or older with them. For treatment visits, patients cannot have anyone with them due to current Covid guidelines and our immunocompromised population.

## 2021-04-26 NOTE — Progress Notes (Signed)
Per Dr. Lorenso Courier - okay to proceed with echo from Dec. 2021

## 2021-04-28 ENCOUNTER — Inpatient Hospital Stay: Payer: BC Managed Care – PPO

## 2021-04-28 ENCOUNTER — Other Ambulatory Visit: Payer: Self-pay

## 2021-04-28 VITALS — BP 112/80 | HR 102 | Temp 99.2°F | Resp 20

## 2021-04-28 DIAGNOSIS — D701 Agranulocytosis secondary to cancer chemotherapy: Secondary | ICD-10-CM | POA: Diagnosis not present

## 2021-04-28 DIAGNOSIS — C8121 Mixed cellularity classical Hodgkin lymphoma, lymph nodes of head, face, and neck: Secondary | ICD-10-CM | POA: Diagnosis not present

## 2021-04-28 DIAGNOSIS — I10 Essential (primary) hypertension: Secondary | ICD-10-CM | POA: Diagnosis not present

## 2021-04-28 DIAGNOSIS — Z5189 Encounter for other specified aftercare: Secondary | ICD-10-CM | POA: Diagnosis not present

## 2021-04-28 DIAGNOSIS — Z7984 Long term (current) use of oral hypoglycemic drugs: Secondary | ICD-10-CM | POA: Diagnosis not present

## 2021-04-28 DIAGNOSIS — Z87891 Personal history of nicotine dependence: Secondary | ICD-10-CM | POA: Diagnosis not present

## 2021-04-28 DIAGNOSIS — Z1501 Genetic susceptibility to malignant neoplasm of breast: Secondary | ICD-10-CM | POA: Diagnosis not present

## 2021-04-28 DIAGNOSIS — Z5111 Encounter for antineoplastic chemotherapy: Secondary | ICD-10-CM | POA: Diagnosis not present

## 2021-04-28 DIAGNOSIS — Z8249 Family history of ischemic heart disease and other diseases of the circulatory system: Secondary | ICD-10-CM | POA: Diagnosis not present

## 2021-04-28 DIAGNOSIS — T451X5D Adverse effect of antineoplastic and immunosuppressive drugs, subsequent encounter: Secondary | ICD-10-CM | POA: Diagnosis not present

## 2021-04-28 DIAGNOSIS — Z79899 Other long term (current) drug therapy: Secondary | ICD-10-CM | POA: Diagnosis not present

## 2021-04-28 DIAGNOSIS — Z8546 Personal history of malignant neoplasm of prostate: Secondary | ICD-10-CM | POA: Diagnosis not present

## 2021-04-28 DIAGNOSIS — G4700 Insomnia, unspecified: Secondary | ICD-10-CM | POA: Diagnosis not present

## 2021-04-28 MED ORDER — PEGFILGRASTIM-CBQV 6 MG/0.6ML ~~LOC~~ SOSY
6.0000 mg | PREFILLED_SYRINGE | Freq: Once | SUBCUTANEOUS | Status: AC
  Administered 2021-04-28: 6 mg via SUBCUTANEOUS

## 2021-04-28 MED ORDER — PEGFILGRASTIM-CBQV 6 MG/0.6ML ~~LOC~~ SOSY
PREFILLED_SYRINGE | SUBCUTANEOUS | Status: AC
  Filled 2021-04-28: qty 0.6

## 2021-04-28 NOTE — Patient Instructions (Signed)
Pegfilgrastim injection What is this medicine? PEGFILGRASTIM (PEG fil gra stim) is a long-acting granulocyte colony-stimulating factor that stimulates the growth of neutrophils, a type of white blood cell important in the body's fight against infection. It is used to reduce the incidence of fever and infection in patients with certain types of cancer who are receiving chemotherapy that affects the bone marrow, and to increase survival after being exposed to high doses of radiation. This medicine may be used for other purposes; ask your health care provider or pharmacist if you have questions. COMMON BRAND NAME(S): Fulphila, Neulasta, Nyvepria, UDENYCA, Ziextenzo What should I tell my health care provider before I take this medicine? They need to know if you have any of these conditions:  kidney disease  latex allergy  ongoing radiation therapy  sickle cell disease  skin reactions to acrylic adhesives (On-Body Injector only)  an unusual or allergic reaction to pegfilgrastim, filgrastim, other medicines, foods, dyes, or preservatives  pregnant or trying to get pregnant  breast-feeding How should I use this medicine? This medicine is for injection under the skin. If you get this medicine at home, you will be taught how to prepare and give the pre-filled syringe or how to use the On-body Injector. Refer to the patient Instructions for Use for detailed instructions. Use exactly as directed. Tell your healthcare provider immediately if you suspect that the On-body Injector may not have performed as intended or if you suspect the use of the On-body Injector resulted in a missed or partial dose. It is important that you put your used needles and syringes in a special sharps container. Do not put them in a trash can. If you do not have a sharps container, call your pharmacist or healthcare provider to get one. Talk to your pediatrician regarding the use of this medicine in children. While this drug  may be prescribed for selected conditions, precautions do apply. Overdosage: If you think you have taken too much of this medicine contact a poison control center or emergency room at once. NOTE: This medicine is only for you. Do not share this medicine with others. What if I miss a dose? It is important not to miss your dose. Call your doctor or health care professional if you miss your dose. If you miss a dose due to an On-body Injector failure or leakage, a new dose should be administered as soon as possible using a single prefilled syringe for manual use. What may interact with this medicine? Interactions have not been studied. This list may not describe all possible interactions. Give your health care provider a list of all the medicines, herbs, non-prescription drugs, or dietary supplements you use. Also tell them if you smoke, drink alcohol, or use illegal drugs. Some items may interact with your medicine. What should I watch for while using this medicine? Your condition will be monitored carefully while you are receiving this medicine. You may need blood work done while you are taking this medicine. Talk to your health care provider about your risk of cancer. You may be more at risk for certain types of cancer if you take this medicine. If you are going to need a MRI, CT scan, or other procedure, tell your doctor that you are using this medicine (On-Body Injector only). What side effects may I notice from receiving this medicine? Side effects that you should report to your doctor or health care professional as soon as possible:  allergic reactions (skin rash, itching or hives, swelling of   the face, lips, or tongue)  back pain  dizziness  fever  pain, redness, or irritation at site where injected  pinpoint red spots on the skin  red or dark-brown urine  shortness of breath or breathing problems  stomach or side pain, or pain at the shoulder  swelling  tiredness  trouble  passing urine or change in the amount of urine  unusual bruising or bleeding Side effects that usually do not require medical attention (report to your doctor or health care professional if they continue or are bothersome):  bone pain  muscle pain This list may not describe all possible side effects. Call your doctor for medical advice about side effects. You may report side effects to FDA at 1-800-FDA-1088. Where should I keep my medicine? Keep out of the reach of children. If you are using this medicine at home, you will be instructed on how to store it. Throw away any unused medicine after the expiration date on the label. NOTE: This sheet is a summary. It may not cover all possible information. If you have questions about this medicine, talk to your doctor, pharmacist, or health care provider.  2021 Elsevier/Gold Standard (2019-12-13 13:20:51)  

## 2021-05-10 ENCOUNTER — Inpatient Hospital Stay: Payer: BC Managed Care – PPO

## 2021-05-10 ENCOUNTER — Other Ambulatory Visit: Payer: BC Managed Care – PPO

## 2021-05-10 ENCOUNTER — Other Ambulatory Visit: Payer: Self-pay

## 2021-05-10 ENCOUNTER — Other Ambulatory Visit: Payer: Self-pay | Admitting: Hematology and Oncology

## 2021-05-10 ENCOUNTER — Inpatient Hospital Stay: Payer: BC Managed Care – PPO | Attending: Hematology and Oncology | Admitting: Physician Assistant

## 2021-05-10 VITALS — BP 153/70 | HR 99 | Temp 97.7°F | Resp 19 | Wt 250.9 lb

## 2021-05-10 DIAGNOSIS — G4700 Insomnia, unspecified: Secondary | ICD-10-CM | POA: Insufficient documentation

## 2021-05-10 DIAGNOSIS — C8121 Mixed cellularity classical Hodgkin lymphoma, lymph nodes of head, face, and neck: Secondary | ICD-10-CM

## 2021-05-10 DIAGNOSIS — Z79899 Other long term (current) drug therapy: Secondary | ICD-10-CM | POA: Diagnosis not present

## 2021-05-10 DIAGNOSIS — Z95828 Presence of other vascular implants and grafts: Secondary | ICD-10-CM

## 2021-05-10 DIAGNOSIS — D709 Neutropenia, unspecified: Secondary | ICD-10-CM

## 2021-05-10 DIAGNOSIS — Z5111 Encounter for antineoplastic chemotherapy: Secondary | ICD-10-CM | POA: Diagnosis not present

## 2021-05-10 DIAGNOSIS — G4709 Other insomnia: Secondary | ICD-10-CM

## 2021-05-10 LAB — CMP (CANCER CENTER ONLY)
ALT: 46 U/L — ABNORMAL HIGH (ref 0–44)
AST: 42 U/L — ABNORMAL HIGH (ref 15–41)
Albumin: 3.8 g/dL (ref 3.5–5.0)
Alkaline Phosphatase: 90 U/L (ref 38–126)
Anion gap: 13 (ref 5–15)
BUN: 9 mg/dL (ref 6–20)
CO2: 21 mmol/L — ABNORMAL LOW (ref 22–32)
Calcium: 9.3 mg/dL (ref 8.9–10.3)
Chloride: 103 mmol/L (ref 98–111)
Creatinine: 0.83 mg/dL (ref 0.61–1.24)
GFR, Estimated: >60 mL/min (ref >60)
Glucose, Bld: 267 mg/dL — ABNORMAL HIGH (ref 70–99)
Potassium: 3.8 mmol/L (ref 3.5–5.1)
Sodium: 137 mmol/L (ref 135–145)
Total Bilirubin: 0.3 mg/dL (ref 0.3–1.2)
Total Protein: 7.2 g/dL (ref 6.5–8.1)

## 2021-05-10 LAB — CBC WITH DIFFERENTIAL (CANCER CENTER ONLY)
Abs Immature Granulocytes: 0.89 10*3/uL — ABNORMAL HIGH (ref 0.00–0.07)
Basophils Absolute: 0.1 10*3/uL (ref 0.0–0.1)
Basophils Relative: 1 %
Eosinophils Absolute: 0.2 10*3/uL (ref 0.0–0.5)
Eosinophils Relative: 1 %
HCT: 35.3 % — ABNORMAL LOW (ref 39.0–52.0)
Hemoglobin: 11.8 g/dL — ABNORMAL LOW (ref 13.0–17.0)
Immature Granulocytes: 7 %
Lymphocytes Relative: 9 %
Lymphs Abs: 1.1 10*3/uL (ref 0.7–4.0)
MCH: 30.4 pg (ref 26.0–34.0)
MCHC: 33.4 g/dL (ref 30.0–36.0)
MCV: 91 fL (ref 80.0–100.0)
Monocytes Absolute: 0.7 10*3/uL (ref 0.1–1.0)
Monocytes Relative: 6 %
Neutro Abs: 9.3 10*3/uL — ABNORMAL HIGH (ref 1.7–7.7)
Neutrophils Relative %: 76 %
Platelet Count: 160 10*3/uL (ref 150–400)
RBC: 3.88 MIL/uL — ABNORMAL LOW (ref 4.22–5.81)
RDW: 16.5 % — ABNORMAL HIGH (ref 11.5–15.5)
WBC Count: 12.2 10*3/uL — ABNORMAL HIGH (ref 4.0–10.5)
nRBC: 0 % (ref 0.0–0.2)

## 2021-05-10 LAB — LACTATE DEHYDROGENASE: LDH: 241 U/L — ABNORMAL HIGH (ref 98–192)

## 2021-05-10 MED ORDER — VINBLASTINE SULFATE CHEMO INJECTION 1 MG/ML
6.0000 mg/m2 | Freq: Once | INTRAVENOUS | Status: AC
  Administered 2021-05-10: 14.6 mg via INTRAVENOUS
  Filled 2021-05-10: qty 14.6

## 2021-05-10 MED ORDER — SODIUM CHLORIDE 0.9 % IV SOLN
Freq: Once | INTRAVENOUS | Status: AC
Start: 2021-05-10 — End: 2021-05-10
  Filled 2021-05-10: qty 250

## 2021-05-10 MED ORDER — ZOLPIDEM TARTRATE 5 MG PO TABS: ORAL_TABLET | ORAL | 0 refills | Status: DC

## 2021-05-10 MED ORDER — SODIUM CHLORIDE 0.9 % IV SOLN
375.0000 mg/m2 | Freq: Once | INTRAVENOUS | Status: AC
  Administered 2021-05-10: 920 mg via INTRAVENOUS
  Filled 2021-05-10: qty 92

## 2021-05-10 MED ORDER — SODIUM CHLORIDE 0.9% FLUSH
10.0000 mL | Freq: Once | INTRAVENOUS | Status: AC
  Administered 2021-05-10: 10 mL
  Filled 2021-05-10: qty 10

## 2021-05-10 MED ORDER — PALONOSETRON HCL INJECTION 0.25 MG/5ML
INTRAVENOUS | Status: AC
  Filled 2021-05-10: qty 5

## 2021-05-10 MED ORDER — SODIUM CHLORIDE 0.9% FLUSH
10.0000 mL | INTRAVENOUS | Status: DC | PRN
  Administered 2021-05-10: 10 mL
  Filled 2021-05-10: qty 10

## 2021-05-10 MED ORDER — SODIUM CHLORIDE 0.9 % IV SOLN
150.0000 mg | Freq: Once | INTRAVENOUS | Status: AC
  Administered 2021-05-10: 150 mg via INTRAVENOUS
  Filled 2021-05-10: qty 150

## 2021-05-10 MED ORDER — HEPARIN SOD (PORK) LOCK FLUSH 100 UNIT/ML IV SOLN
500.0000 [IU] | Freq: Once | INTRAVENOUS | Status: AC | PRN
  Administered 2021-05-10: 500 [IU]
  Filled 2021-05-10: qty 5

## 2021-05-10 MED ORDER — SODIUM CHLORIDE 0.9 % IV SOLN
10.0000 mg | Freq: Once | INTRAVENOUS | Status: AC
  Administered 2021-05-10: 10 mg via INTRAVENOUS
  Filled 2021-05-10: qty 10

## 2021-05-10 MED ORDER — DOXORUBICIN HCL CHEMO IV INJECTION 2 MG/ML
25.0000 mg/m2 | Freq: Once | INTRAVENOUS | Status: AC
  Administered 2021-05-10: 62 mg via INTRAVENOUS
  Filled 2021-05-10: qty 31

## 2021-05-10 MED ORDER — PALONOSETRON HCL INJECTION 0.25 MG/5ML
0.2500 mg | Freq: Once | INTRAVENOUS | Status: AC
  Administered 2021-05-10: 0.25 mg via INTRAVENOUS

## 2021-05-10 NOTE — Patient Instructions (Signed)
[facility]   Discharge Instructions: Thank you for choosing [facility] to provide your oncology and hematology care.   If you have a lab appointment with the [facility], please go directly to the [facility] and check in at the registration area.   Wear comfortable clothing and clothing appropriate for easy access to any Portacath or PICC line.   We strive to give you quality time with your provider. You may need to reschedule your appointment if you arrive late (15 or more minutes).  Arriving late affects you and other patients whose appointments are after yours.  Also, if you miss three or more appointments without notifying the office, you may be dismissed from the clinic at the provider's discretion.      For prescription refill requests, have your pharmacy contact our office and allow 72 hours for refills to be completed.    Today you received the following chemotherapy and/or immunotherapy agents: Doxorubicin, Vinblastine, and Dacarbazine    To help prevent nausea and vomiting after your treatment, we encourage you to take your nausea medication as directed.  BELOW ARE SYMPTOMS THAT SHOULD BE REPORTED IMMEDIATELY: . *FEVER GREATER THAN 100.4 F (38 C) OR HIGHER . *CHILLS OR SWEATING . *NAUSEA AND VOMITING THAT IS NOT CONTROLLED WITH YOUR NAUSEA MEDICATION . *UNUSUAL SHORTNESS OF BREATH . *UNUSUAL BRUISING OR BLEEDING . *URINARY PROBLEMS (pain or burning when urinating, or frequent urination) . *BOWEL PROBLEMS (unusual diarrhea, constipation, pain near the anus) . TENDERNESS IN MOUTH AND THROAT WITH OR WITHOUT PRESENCE OF ULCERS (sore throat, sores in mouth, or a toothache) . UNUSUAL RASH, SWELLING OR PAIN  . UNUSUAL VAGINAL DISCHARGE OR ITCHING   Items with * indicate a potential emergency and should be followed up as soon as possible or go to the Emergency Department if any problems should occur.  Please show the CHEMOTHERAPY  ALERT CARD or IMMUNOTHERAPY ALERT CARD at check-in to the Emergency Department and triage nurse.  Should you have questions after your visit or need to cancel or reschedule your appointment, please contact [facility]  Dept: [phone]  and follow the prompts.  Office hours are 8:00 a.m. to 4:30 p.m. Monday - Friday. Please note that voicemails left after 4:00 p.m. may not be returned until the following business day.  We are closed weekends and major holidays. You have access to a nurse at all times for urgent questions. Please call the main number to the clinic Dept: [phone] and follow the prompts.   For any non-urgent questions, you may also contact your provider using MyChart. We now offer e-Visits for anyone 73 and older to request care online for non-urgent symptoms. For details visit [email or web address].   Also download the MyChart app! Go to the app store, search "MyChart", open the app, select [facility], and log in with your MyChart username and password.  Due to Covid, a mask is required upon entering the hospital/clinic. If you do not have a mask, one will be given to you upon arrival. For doctor visits, patients may have 1 support person aged 6 or older with them. For treatment visits, patients cannot have anyone with them due to current Covid guidelines and our immunocompromised population.

## 2021-05-10 NOTE — Progress Notes (Signed)
[place] [phone]   [phone]  PROGRESS NOTE  Patient Care Team: London Pepper, MD as PCP - General (Family Medicine)  Hematological/Oncological History # Classical Hodgkin's Lymphoma, Mixed Cellularity. 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 2) 10/27/2020: resection of neck mass. Pathology showed classical Hodgkin's lymphoma, mixed cellularity subtype 3) 11/13/2020: establish care with Dr. Lorenso Courier  4) 11/26/2020: PET CT scan shows single hypermetabolic unenlarged right level II cervical lymph node identified on today's study. FDG uptake is compatible with [place] 4. Other small bilateral cervical and upper normal to borderline hepatoduodenal ligament lymph nodes show Deauville 2 uptake levels 5) 12/18/2020: Cycle 1 Day 1 of AVD chemotherapy  6) 01/15/2021: Cycle 2 Day 1 of AVD chemotherapy  7) 02/12/2021: PET CT scan shows no evidence of residual disease 8) 02/15/2021: Cycle 3 Day 1 of AVD chemotherapy  9) 03/01/2021: Cycle 3 Day 15 of AVD chemotherapy  10) 03/15/2021: Cycle 4 Day 1 of AVD chemotherapy  11) 03/29/2021: Cycle 4 Day 15 of AVD chemotherapy 12) 04/12/2021: Cycle 5 Day 1 of AVD chemotherapy 13) 04/26/2021: Cycle 5 Day 15 of AVD chemotherapy 14) 05/10/2021: Cycle 6 Day 1 of AVD chemotherapy  Interval History:  Iram Lundberg 60 y.o. male with medical history significant for Classical Hodgkin's lymphoma Early Stage/Unfavorable risk who presents for a follow up visit. The patient's last visit was on 04/26/2021. In the interim since the last visit he has continued to tolerate treatment well.   On exam today Mr. Bhola continues to do well with stable energy levels. He notices more fatigue the week of chemotherapy. He continues to have insomnia mainly the week after chemotherapy. He currently takes Ambien 5 mg which he takes 1-2 tablets before bedtime.  He  denies any nausea, vomiting or abdominal pain. His bowel movements are regular without any constipation or diarrhea. Patient denies easy bruising or signs of bleeding. Patient denies any fevers, chills, night sweats, changes in shortness of breath, chest pain or cough.  He has no other complaints. A full 10 point ROS is listed below.  MEDICAL HISTORY:  Past Medical History:  Diagnosis Date   Cancer ([facility]) 10/15   recent dx prostate ca-no tx yet   GERD (gastroesophageal reflux disease)    Hypertension    Pneumonia    history   Teeth decayed     SURGICAL HISTORY: Past Surgical History:  Procedure Laterality Date   IR IMAGING GUIDED PORT INSERTION  12/11/2020   MASS BIOPSY Left 09/29/2014   Procedure: LEFT LOWER NECK MASS EXCISION;  Surgeon: Izora Gala, MD;  Location: [facility];  Service: ENT;  Laterality: Left;   PAROTIDECTOMY Left 09/29/2014   Procedure: LEFT PAROTIDECTOMY;  Surgeon: Izora Gala, MD;  Location: [facility];  Service: ENT;  Laterality: Left;   SEPTOPLASTY  1976    SOCIAL HISTORY: Social History   Socioeconomic History   Marital status: Widowed    Spouse name: Not on file   Number of children: Not on file   Years of education: Not on file   Highest education level: Not on file  Occupational History   Not on file  Tobacco Use   Smoking status: Former Smoker    Packs/day: 3.00    Years: 32.00    Pack years: 96.00    Types: Cigarettes    Quit date: 11/26/2017    Years since quitting: 3.4  Smokeless tobacco: Never Used  Vaping Use   Vaping Use: Never used  Substance and Sexual Activity   Alcohol use: Yes    Comment: twice a year   Drug use: No   Sexual activity: Not on file  Other Topics Concern   Not on file  Social History Narrative   Not on file   Social Determinants of Health   Financial Resource Strain: Not on file  Food Insecurity: Not on file  Transportation Needs: Not on file  Physical Activity: Not on file   Stress: Not on file  Social Connections: Not on file  Intimate Partner Violence: Not on file    FAMILY HISTORY: Family History  Problem Relation Age of Onset   Stroke Mother    Heart disease Father     ALLERGIES:  has No Known Allergies.  MEDICATIONS:  Current Outpatient Medications  Medication Sig Dispense Refill   acetaminophen (TYLENOL) 500 MG tablet Take 500 mg by mouth every 6 (six) hours as needed for headache.     amLODipine (NORVASC) 5 MG tablet Take 5 mg by mouth every evening.     B Complex-Folic Acid (B COMPLEX FORMULA 1, W/ FA,) TABS Take 1 tablet by mouth daily. 30 tablet 3   lidocaine-prilocaine (EMLA) cream Apply 1 application topically as needed. (Patient taking differently: Apply 1 application topically as needed (access port).) 30 g 0   losartan (COZAAR) 100 MG tablet Take 100 mg by mouth every evening.     metFORMIN (GLUCOPHAGE) 500 MG tablet Take 1 tablet by mouth 2 (two) times daily.     ondansetron (ZOFRAN) 8 MG tablet Take 1 tablet (8 mg total) by mouth every 8 (eight) hours as needed for nausea or vomiting. 39 tablet 0   pantoprazole (PROTONIX) 40 MG tablet Take 1 tablet by mouth every evening.     prochlorperazine (COMPAZINE) 10 MG tablet Take 1 tablet (10 mg total) by mouth every 6 (six) hours as needed for nausea or vomiting. 30 tablet 0   rosuvastatin (CRESTOR) 5 MG tablet Take 5 mg by mouth every evening.     zolpidem (AMBIEN) 5 MG tablet Take 1 tablet (5 mg total) by mouth at bedtime as needed for sleep. (Patient not taking: Reported on 05/10/2021) 15 tablet 0   No current facility-administered medications for this visit.   Facility-Administered Medications Ordered in Other Visits  Medication Dose Route Frequency Provider Last Rate Last Admin   0.9 %  sodium chloride infusion   Intravenous Continuous Allred, Darrell K, PA-C        REVIEW OF SYSTEMS:   Constitutional: ( - ) fevers, ( - )  chills , ( - ) night sweats Eyes: ( - ) blurriness of vision,  ( - ) double vision, ( - ) watery eyes Ears, nose, mouth, throat, and face: ( - ) mucositis, ( - ) sore throat Respiratory: ( - ) cough, ( - ) dyspnea, ( - ) wheezes Cardiovascular: ( - ) palpitation, ( - ) chest discomfort, ( - ) lower extremity swelling Gastrointestinal:  ( - ) nausea, ( - ) heartburn, ( - ) change in bowel habits Skin: ( - ) abnormal skin rashes Lymphatics: ( - ) new lymphadenopathy, ( - ) easy bruising Neurological: ( - ) numbness, ( - ) tingling, ( - ) new weaknesses Behavioral/Psych: ( - ) mood change, ( - ) new changes  All other systems were reviewed with the patient and are negative.  PHYSICAL EXAMINATION: ECOG  PERFORMANCE STATUS: 1 - Symptomatic but completely ambulatory  Vitals:   05/10/21 1028  BP: (!) 153/70  Pulse: 99  Resp: 19  Temp: 97.7 F (36.5 C)  SpO2: 96%   Filed Weights   05/10/21 1028  Weight: 250 lb 14.4 oz (113.8 kg)    GENERAL: well appearing middle aged Caucasian male in NAD SKIN: skin color, texture, turgor are normal, no rashes or significant lesions EYES: conjunctiva are pink and non-injected, sclera clear LUNGS: clear to auscultation and percussion with normal breathing effort HEART: regular rate & rhythm and no murmurs and no lower extremity edema Musculoskeletal: no cyanosis of digits and no clubbing  PSYCH: alert & oriented x 3, fluent speech NEURO: no focal motor/sensory deficits  LABORATORY DATA:  I have reviewed the data as listed CBC Latest Ref Rng & Units 05/10/2021 04/26/2021 04/12/2021  WBC 4.0 - 10.5 K/uL 12.2(H) 16.4(H) 12.2(H)  Hemoglobin 13.0 - 17.0 g/dL 11.8(L) 11.7(L) 11.5(L)  Hematocrit 39.0 - 52.0 % 35.3(L) 36.2(L) 34.9(L)  Platelets 150 - 400 K/uL 160 174 158    CMP Latest Ref Rng & Units 04/26/2021 04/12/2021 03/29/2021  Glucose 70 - 99 mg/dL 171(H) 221(H) 254(H)  BUN 6 - 20 mg/dL '8 8 9  ' Creatinine 0.61 - 1.24 mg/dL 0.79 0.87 0.93  Sodium 135 - 145 mmol/L 140 140 139  Potassium 3.5 - 5.1 mmol/L 3.8 3.5 3.8   Chloride 98 - 111 mmol/L 106 105 104  CO2 22 - 32 mmol/L '26 22 23  ' Calcium 8.9 - 10.3 mg/dL 9.3 9.2 8.9  Total Protein 6.5 - 8.1 g/dL 6.9 6.9 7.1  Total Bilirubin 0.3 - 1.2 mg/dL 0.3 0.3 0.4  Alkaline Phos 38 - 126 U/L 92 82 81  AST 15 - 41 U/L 40 37 41  ALT 0 - 44 U/L 45(H) 41 48(H)    No results found for: MPROTEIN No results found for: KPAFRELGTCHN, LAMBDASER, KAPLAMBRATIO   RADIOGRAPHIC STUDIES:  No results found.  ASSESSMENT & PLAN Caryl Manas 60 y.o. male with medical history significant for Classical Hodgkin's lymphoma Early Stage/Unfavorable risk who presents for a follow up visit.  After review the labs, the records, review of the PFTs, and review the echocardiogram the findings are most consistent with an early stage/unfavorable risk classical Hodgkin's lymphoma.  Given that he meets unfavorable criteria via multiple guidelines I recommended that we proceed with treatment as recommended in the NCCN guidelines for patients with early stage unfavorable disease (which is the exact same treatment recommended for stage III-IV disease).  Previously we discussed the AVD chemotherapy regimen.  We discussed the expected side effects including possible hair loss, nausea, vomiting, diarrhea, constipation, cytopenias, neutropenia, cardiac dysfunction, and neurological damage.  The patient will be treated with every 2 week rounds of chemotherapy using this regimen. AVD chemotherapy consists of doxorubicin 25 mg/m on days 1 and 15, vinblastine 6 mg per metered squared IV on days 1 and 15, and dacarbazine 375 mg/m IV on days 1 and 15.  After 2 cycles of AVD chemotherapy, PET/CT scan from 02/12/2021 revealed complete response to therapy with no evidence of lymphoma. The recommendation is to proceed with a further 4 cycles of AVD therapy.   # Classical Hodgkin's Lymphoma, Mixed Cellularity. 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 There are some smaller lymph nodes in the neck and one near the hepatoduodenal ligament, though would favor diagnosis of early stage --based on these criteria the recommendation would be for AVD x 2 cycles with interval PET CT scan followed by AVD x 4 cycles.  --interval PET on 02/12/2021 showed complete response to therapy --Today is Cycle 6  Day 1 of AVD chemotherapy.  Labs (CBC, CMP, LDH) from today were reviewed without any intervention needed. Patient will proceed with treatment as planned.  --will avoid bleomycin in this patient due to smoking history, mild abnormalities on PFTs and age --RTC for Cycle 6 Day 15 of treatment in 2 weeks   #Insomnia -- Occurs predominantly during his first week of chemotherapy. -- Patient is been trying Tylenol PM without relief. -- Current only Ambien 5 mg p.o. daily that he takes 1-2 tablets as needed with some improvement. Sent refill today.  -- Advised to limit screen time 2 hours before bedtime.  -- Continue to monitor.  #Neutropenia, Severe --expected with this chemotherapy regimen. The use of GCSF with this chemotherapy is not routinely recommended as these patients rarely get neutropenic fever despite low ANCs --however, this patient had an episode of febrile neutropenia. Will pursue GCSF therapy following his treatment.   #Supportive Care --chemotherapy education to be scheduled  --zofran 84m q8H PRN and compazine 127mPO q6H for nausea -- EMLA cream for port -- no pain medication required at this time.   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 No biopsy recommended.  --continue to monitor on future  scans.  No orders of the defined types were placed in this encounter.  All questions were answered. The patient knows to call the clinic with any problems, questions or concerns.  I have spent a total of 35 minutes minutes of face-to-face and non-face-to-face time, preparing to see the patient, obtaining and/or reviewing separately obtained history, performing a medically appropriate examination, counseling and educating the patient, ordering tests,documenting clinical information in the electronic health record and care coordination.   IrDede QueryA-C Department of Hematology/Oncology [facility]: [phone] was seen with Dr. DoLorenso Courier   05/10/2021 10:32 AM  I have read the above note and personally examined the patient. I agree with the assessment and plan as noted above.  Briefly Mr. CoRennakers a [age]ear old male with Hodgkin's lymphoma currently on Cycle 6 Day 1 of AVD chemotherapy.  He is tolerating treatment well overall with minimal side effects.  He is willing and able to proceed with treatment at this time.  His final chemotherapy session will be in 2 weeks.   JoLedell PeoplesMD Department of Hematology/Oncology [facility]hone: [phone]: [phone]: [email or web address]

## 2021-05-12 ENCOUNTER — Inpatient Hospital Stay: Payer: BC Managed Care – PPO

## 2021-05-12 ENCOUNTER — Other Ambulatory Visit: Payer: Self-pay

## 2021-05-12 VITALS — BP 115/73 | HR 110 | Temp 97.9°F | Resp 20

## 2021-05-12 DIAGNOSIS — C8121 Mixed cellularity classical Hodgkin lymphoma, lymph nodes of head, face, and neck: Secondary | ICD-10-CM

## 2021-05-12 DIAGNOSIS — Z5111 Encounter for antineoplastic chemotherapy: Secondary | ICD-10-CM | POA: Diagnosis not present

## 2021-05-12 DIAGNOSIS — G4700 Insomnia, unspecified: Secondary | ICD-10-CM | POA: Diagnosis not present

## 2021-05-12 DIAGNOSIS — Z79899 Other long term (current) drug therapy: Secondary | ICD-10-CM | POA: Diagnosis not present

## 2021-05-12 MED ORDER — PEGFILGRASTIM-CBQV 6 MG/0.6ML ~~LOC~~ SOSY
6.0000 mg | PREFILLED_SYRINGE | Freq: Once | SUBCUTANEOUS | Status: AC
  Administered 2021-05-12: 6 mg via SUBCUTANEOUS

## 2021-05-12 MED ORDER — PEGFILGRASTIM-CBQV 6 MG/0.6ML ~~LOC~~ SOSY
PREFILLED_SYRINGE | SUBCUTANEOUS | Status: AC
  Filled 2021-05-12: qty 0.6

## 2021-05-21 ENCOUNTER — Encounter: Payer: Self-pay | Admitting: Hematology and Oncology

## 2021-05-24 ENCOUNTER — Other Ambulatory Visit: Payer: Self-pay

## 2021-05-24 ENCOUNTER — Inpatient Hospital Stay: Payer: BC Managed Care – PPO

## 2021-05-24 ENCOUNTER — Inpatient Hospital Stay (HOSPITAL_BASED_OUTPATIENT_CLINIC_OR_DEPARTMENT_OTHER): Payer: BC Managed Care – PPO | Admitting: Hematology and Oncology

## 2021-05-24 VITALS — BP 141/82 | HR 96 | Temp 98.6°F | Resp 18 | Ht 70.0 in | Wt 251.6 lb

## 2021-05-24 DIAGNOSIS — C8121 Mixed cellularity classical Hodgkin lymphoma, lymph nodes of head, face, and neck: Secondary | ICD-10-CM

## 2021-05-24 DIAGNOSIS — Z95828 Presence of other vascular implants and grafts: Secondary | ICD-10-CM

## 2021-05-24 DIAGNOSIS — G4700 Insomnia, unspecified: Secondary | ICD-10-CM | POA: Diagnosis not present

## 2021-05-24 DIAGNOSIS — D709 Neutropenia, unspecified: Secondary | ICD-10-CM

## 2021-05-24 DIAGNOSIS — Z5111 Encounter for antineoplastic chemotherapy: Secondary | ICD-10-CM | POA: Diagnosis not present

## 2021-05-24 DIAGNOSIS — Z79899 Other long term (current) drug therapy: Secondary | ICD-10-CM | POA: Diagnosis not present

## 2021-05-24 LAB — CBC WITH DIFFERENTIAL (CANCER CENTER ONLY)
Abs Immature Granulocytes: 0.51 10*3/uL — ABNORMAL HIGH (ref 0.00–0.07)
Basophils Absolute: 0.1 10*3/uL (ref 0.0–0.1)
Basophils Relative: 1 %
Eosinophils Absolute: 0.1 10*3/uL (ref 0.0–0.5)
Eosinophils Relative: 1 %
HCT: 35.6 % — ABNORMAL LOW (ref 39.0–52.0)
Hemoglobin: 11.5 g/dL — ABNORMAL LOW (ref 13.0–17.0)
Immature Granulocytes: 5 %
Lymphocytes Relative: 11 %
Lymphs Abs: 1.1 10*3/uL (ref 0.7–4.0)
MCH: 29.9 pg (ref 26.0–34.0)
MCHC: 32.3 g/dL (ref 30.0–36.0)
MCV: 92.5 fL (ref 80.0–100.0)
Monocytes Absolute: 0.7 10*3/uL (ref 0.1–1.0)
Monocytes Relative: 7 %
Neutro Abs: 7.8 10*3/uL — ABNORMAL HIGH (ref 1.7–7.7)
Neutrophils Relative %: 75 %
Platelet Count: 162 10*3/uL (ref 150–400)
RBC: 3.85 MIL/uL — ABNORMAL LOW (ref 4.22–5.81)
RDW: 16.3 % — ABNORMAL HIGH (ref 11.5–15.5)
WBC Count: 10.3 10*3/uL (ref 4.0–10.5)
nRBC: 0 % (ref 0.0–0.2)

## 2021-05-24 LAB — CMP (CANCER CENTER ONLY)
ALT: 45 U/L — ABNORMAL HIGH (ref 0–44)
AST: 39 U/L (ref 15–41)
Albumin: 4.2 g/dL (ref 3.5–5.0)
Alkaline Phosphatase: 80 U/L (ref 38–126)
Anion gap: 10 (ref 5–15)
BUN: 13 mg/dL (ref 6–20)
CO2: 24 mmol/L (ref 22–32)
Calcium: 9.3 mg/dL (ref 8.9–10.3)
Chloride: 105 mmol/L (ref 98–111)
Creatinine: 0.7 mg/dL (ref 0.61–1.24)
GFR, Estimated: >60 mL/min (ref >60)
Glucose, Bld: 188 mg/dL — ABNORMAL HIGH (ref 70–99)
Potassium: 3.7 mmol/L (ref 3.5–5.1)
Sodium: 139 mmol/L (ref 135–145)
Total Bilirubin: <0.1 mg/dL — ABNORMAL LOW (ref 0.3–1.2)
Total Protein: 7.5 g/dL (ref 6.5–8.1)

## 2021-05-24 LAB — LACTATE DEHYDROGENASE: LDH: 140 U/L (ref 98–192)

## 2021-05-24 MED ORDER — SODIUM CHLORIDE 0.9 % IV SOLN
10.0000 mg | Freq: Once | INTRAVENOUS | Status: AC
  Administered 2021-05-24: 10 mg via INTRAVENOUS
  Filled 2021-05-24: qty 10

## 2021-05-24 MED ORDER — SODIUM CHLORIDE 0.9% FLUSH
10.0000 mL | Freq: Once | INTRAVENOUS | Status: AC
Start: 2021-05-24 — End: 2021-05-24
  Administered 2021-05-24: 10 mL
  Filled 2021-05-24: qty 10

## 2021-05-24 MED ORDER — DOXORUBICIN HCL CHEMO IV INJECTION 2 MG/ML
25.0000 mg/m2 | Freq: Once | INTRAVENOUS | Status: AC
  Administered 2021-05-24: 62 mg via INTRAVENOUS
  Filled 2021-05-24: qty 31

## 2021-05-24 MED ORDER — VINBLASTINE SULFATE CHEMO INJECTION 1 MG/ML
6.0000 mg/m2 | Freq: Once | INTRAVENOUS | Status: AC
  Administered 2021-05-24: 14.6 mg via INTRAVENOUS
  Filled 2021-05-24: qty 14.6

## 2021-05-24 MED ORDER — ACETAMINOPHEN 325 MG PO TABS
ORAL_TABLET | ORAL | Status: AC
  Filled 2021-05-24: qty 1

## 2021-05-24 MED ORDER — SODIUM CHLORIDE 0.9 % IV SOLN
375.0000 mg/m2 | Freq: Once | INTRAVENOUS | Status: AC
  Administered 2021-05-24: 920 mg via INTRAVENOUS
  Filled 2021-05-24: qty 92

## 2021-05-24 MED ORDER — PALONOSETRON HCL INJECTION 0.25 MG/5ML
0.2500 mg | Freq: Once | INTRAVENOUS | Status: AC
Start: 2021-05-24 — End: 2021-05-24
  Administered 2021-05-24: 0.25 mg via INTRAVENOUS

## 2021-05-24 MED ORDER — SODIUM CHLORIDE 0.9 % IV SOLN
Freq: Once | INTRAVENOUS | Status: AC
  Filled 2021-05-24: qty 250

## 2021-05-24 MED ORDER — SODIUM CHLORIDE 0.9 % IV SOLN
150.0000 mg | Freq: Once | INTRAVENOUS | Status: AC
  Administered 2021-05-24: 150 mg via INTRAVENOUS
  Filled 2021-05-24: qty 150

## 2021-05-24 MED ORDER — PALONOSETRON HCL INJECTION 0.25 MG/5ML
INTRAVENOUS | Status: AC
  Filled 2021-05-24: qty 5

## 2021-05-24 NOTE — Patient Instructions (Signed)
[facility]   Discharge Instructions: Thank you for choosing [facility] to provide your oncology and hematology care.   If you have a lab appointment with the [facility], please go directly to the [facility] and check in at the registration area.   Wear comfortable clothing and clothing appropriate for easy access to any Portacath or PICC line.   We strive to give you quality time with your provider. You may need to reschedule your appointment if you arrive late (15 or more minutes).  Arriving late affects you and other patients whose appointments are after yours.  Also, if you miss three or more appointments without notifying the office, you may be dismissed from the clinic at the provider's discretion.      For prescription refill requests, have your pharmacy contact our office and allow 72 hours for refills to be completed.    Today you received the following chemotherapy and/or immunotherapy agents: Doxorubicin, Vinblastine, and Dacarbazine    To help prevent nausea and vomiting after your treatment, we encourage you to take your nausea medication as directed.  BELOW ARE SYMPTOMS THAT SHOULD BE REPORTED IMMEDIATELY: *FEVER GREATER THAN 100.4 F (38 C) OR HIGHER *CHILLS OR SWEATING *NAUSEA AND VOMITING THAT IS NOT CONTROLLED WITH YOUR NAUSEA MEDICATION *UNUSUAL SHORTNESS OF BREATH *UNUSUAL BRUISING OR BLEEDING *URINARY PROBLEMS (pain or burning when urinating, or frequent urination) *BOWEL PROBLEMS (unusual diarrhea, constipation, pain near the anus) TENDERNESS IN MOUTH AND THROAT WITH OR WITHOUT PRESENCE OF ULCERS (sore throat, sores in mouth, or a toothache) UNUSUAL RASH, SWELLING OR PAIN  UNUSUAL VAGINAL DISCHARGE OR ITCHING   Items with * indicate a potential emergency and should be followed up as soon as possible or go to the Emergency Department if any problems should occur.  Please show the CHEMOTHERAPY ALERT CARD or  IMMUNOTHERAPY ALERT CARD at check-in to the Emergency Department and triage nurse.  Should you have questions after your visit or need to cancel or reschedule your appointment, please contact [facility]  Dept: [phone]  and follow the prompts.  Office hours are 8:00 a.m. to 4:30 p.m. Monday - Friday. Please note that voicemails left after 4:00 p.m. may not be returned until the following business day.  We are closed weekends and major holidays. You have access to a nurse at all times for urgent questions. Please call the main number to the clinic Dept: [phone] and follow the prompts.   For any non-urgent questions, you may also contact your provider using MyChart. We now offer e-Visits for anyone 85 and older to request care online for non-urgent symptoms. For details visit [email or web address].   Also download the MyChart app! Go to the app store, search "MyChart", open the app, select [facility], and log in with your MyChart username and password.  Due to Covid, a mask is required upon entering the hospital/clinic. If you do not have a mask, one will be given to you upon arrival. For doctor visits, patients may have 1 support person aged 54 or older with them. For treatment visits, patients cannot have anyone with them due to current Covid guidelines and our immunocompromised population.

## 2021-05-24 NOTE — Progress Notes (Signed)
[place] [phone]   [phone]) [phone]  PROGRESS NOTE  Patient Care Team: London Pepper, MD as PCP - General (Family Medicine)  Hematological/Oncological History # Classical Hodgkin's Lymphoma, Mixed Cellularity. 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 2) 10/27/2020: resection of neck mass. Pathology showed classical Hodgkin's lymphoma, mixed cellularity subtype 3) 11/13/2020: establish care with Dr. Lorenso Courier  4) 11/26/2020: PET CT scan shows single hypermetabolic unenlarged right level II cervical lymph node identified on today's study. FDG uptake is compatible with [place] 4. Other small bilateral cervical and upper normal to borderline hepatoduodenal ligament lymph nodes show Deauville 2 uptake levels 5) 12/18/2020: Cycle 1 Day 1 of AVD chemotherapy  6) 01/15/2021: Cycle 2 Day 1 of AVD chemotherapy  7) 02/12/2021: PET CT scan shows no evidence of residual disease 8) 02/15/2021: Cycle 3 Day 1 of AVD chemotherapy  9) 03/01/2021: Cycle 3 Day 15 of AVD chemotherapy  10) 03/15/2021: Cycle 4 Day 1 of AVD chemotherapy  11) 03/29/2021: Cycle 4 Day 15 of AVD chemotherapy 12) 04/12/2021: Cycle 5 Day 1 of AVD chemotherapy 13) 04/26/2021: Cycle 5 Day 15 of AVD chemotherapy 14) 05/10/2021: Cycle 6 Day 1 of AVD chemotherapy 15) 05/24/2021: Cycle 6 Day 15 of AVD chemotherapy  Interval History:  [name] 60 y.o. male with medical history significant for Classical Hodgkin's lymphoma Early Stage/Unfavorable risk who presents for a follow up visit. The patient's last visit was on 05/10/2021. In the interim since the last visit he has continued to tolerate treatment well.   On exam today [name] reports he has been well in the interim since her last visit.  He reports that his chemotherapy last cycle followed his usual trend of being wiped out and fatigued the week after with recovery  the following week.  He notes he has been doing his best to try to wear his CPAP more often and notes that he is sleeping better at night with the help of Ambien.  He does have some occasional GI distress and notes that his stomach "flip-flops" causing some occasional diarrhea.  He is only required 1 nausea pill.  Patient denies any fevers, chills, night sweats, changes in shortness of breath, chest pain or cough.  He has no other complaints. A full 10 point ROS is listed below.  MEDICAL HISTORY:  Past Medical History:  Diagnosis Date   Cancer ([place]) 10/15   recent dx prostate ca-no tx yet   GERD (gastroesophageal reflux disease)    Hypertension    Pneumonia    history   Teeth decayed     SURGICAL HISTORY: Past Surgical History:  Procedure Laterality Date   IR IMAGING GUIDED PORT INSERTION  12/11/2020   MASS BIOPSY Left 09/29/2014   Procedure: LEFT LOWER NECK MASS EXCISION;  Surgeon: Izora Gala, MD;  Location: [facility];  Service: ENT;  Laterality: Left;   PAROTIDECTOMY Left 09/29/2014   Procedure: LEFT PAROTIDECTOMY;  Surgeon: Izora Gala, MD;  Location: [facility];  Service: ENT;  Laterality: Left;   SEPTOPLASTY  1976    SOCIAL HISTORY: Social History   Socioeconomic History   Marital status: Widowed    Spouse name: Not on file   Number of children: Not on file   Years of education: Not on file   Highest education level: Not on file  Occupational History   Not on file  Tobacco Use  Smoking status: Former    Packs/day: 3.00    Years: 32.00    Pack years: 96.00    Types: Cigarettes    Quit date: 11/26/2017    Years since quitting: 3.4   Smokeless tobacco: Never  Vaping Use   Vaping Use: Never used  Substance and Sexual Activity   Alcohol use: Yes    Comment: twice a year   Drug use: No   Sexual activity: Not on file  Other Topics Concern   Not on file  Social History Narrative   Not on file   Social Determinants of Health    Financial Resource Strain: Not on file  Food Insecurity: Not on file  Transportation Needs: Not on file  Physical Activity: Not on file  Stress: Not on file  Social Connections: Not on file  Intimate Partner Violence: Not on file    FAMILY HISTORY: Family History  Problem Relation Age of Onset   Stroke Mother    Heart disease Father     ALLERGIES:  has No Known Allergies.  MEDICATIONS:  Current Outpatient Medications  Medication Sig Dispense Refill   acetaminophen (TYLENOL) 500 MG tablet Take 500 mg by mouth every 6 (six) hours as needed for headache.     amLODipine (NORVASC) 5 MG tablet Take 5 mg by mouth every evening.     B Complex-Folic Acid (B COMPLEX FORMULA 1, W/ FA,) TABS Take 1 tablet by mouth daily. 30 tablet 3   lidocaine-prilocaine (EMLA) cream Apply 1 application topically as needed. (Patient taking differently: Apply 1 application topically as needed (access port).) 30 g 0   losartan (COZAAR) 100 MG tablet Take 100 mg by mouth every evening.     metFORMIN (GLUCOPHAGE) 500 MG tablet Take 1 tablet by mouth 2 (two) times daily.     ondansetron (ZOFRAN) 8 MG tablet Take 1 tablet (8 mg total) by mouth every 8 (eight) hours as needed for nausea or vomiting. 39 tablet 0   pantoprazole (PROTONIX) 40 MG tablet Take 1 tablet by mouth every evening.     prochlorperazine (COMPAZINE) 10 MG tablet Take 1 tablet (10 mg total) by mouth every 6 (six) hours as needed for nausea or vomiting. 30 tablet 0   rosuvastatin (CRESTOR) 5 MG tablet Take 5 mg by mouth every evening.     zolpidem (AMBIEN) 5 MG tablet Take 1-2 tablets by mouth at bedtime as needed for sleep. 30 tablet 0   No current facility-administered medications for this visit.   Facility-Administered Medications Ordered in Other Visits  Medication Dose Route Frequency Provider Last Rate Last Admin   0.9 %  sodium chloride infusion   Intravenous Continuous Allred, Darrell K, PA-C        REVIEW OF SYSTEMS:    Constitutional: ( - ) fevers, ( - )  chills , ( - ) night sweats Eyes: ( - ) blurriness of vision, ( - ) double vision, ( - ) watery eyes Ears, nose, mouth, throat, and face: ( - ) mucositis, ( - ) sore throat Respiratory: ( - ) cough, ( - ) dyspnea, ( - ) wheezes Cardiovascular: ( - ) palpitation, ( - ) chest discomfort, ( - ) lower extremity swelling Gastrointestinal:  ( - ) nausea, ( - ) heartburn, ( - ) change in bowel habits Skin: ( - ) abnormal skin rashes Lymphatics: ( - ) new lymphadenopathy, ( - ) easy bruising Neurological: ( - ) numbness, ( - ) tingling, ( - )  new weaknesses Behavioral/Psych: ( - ) mood change, ( - ) new changes  All other systems were reviewed with the patient and are negative.  PHYSICAL EXAMINATION: ECOG PERFORMANCE STATUS: 1 - Symptomatic but completely ambulatory  Vitals:   05/24/21 1101  BP: (!) 141/82  Pulse: 96  Resp: 18  Temp: 98.6 F (37 C)  SpO2: 98%   Filed Weights   05/24/21 1101  Weight: 251 lb 9.6 oz (114.1 kg)    GENERAL: well appearing middle aged Caucasian male in NAD SKIN: skin color, texture, turgor are normal, no rashes or significant lesions EYES: conjunctiva are pink and non-injected, sclera clear LUNGS: clear to auscultation and percussion with normal breathing effort HEART: regular rate & rhythm and no murmurs and no lower extremity edema Musculoskeletal: no cyanosis of digits and no clubbing  PSYCH: alert & oriented x 3, fluent speech NEURO: no focal motor/sensory deficits  LABORATORY DATA:  I have reviewed the data as listed CBC Latest Ref Rng & Units 05/24/2021 05/10/2021 04/26/2021  WBC 4.0 - 10.5 K/uL 10.3 12.2(H) 16.4(H)  Hemoglobin 13.0 - 17.0 g/dL 11.5(L) 11.8(L) 11.7(L)  Hematocrit 39.0 - 52.0 % 35.6(L) 35.3(L) 36.2(L)  Platelets 150 - 400 K/uL 162 160 174    CMP Latest Ref Rng & Units 05/10/2021 04/26/2021 04/12/2021  Glucose 70 - 99 mg/dL 267(H) 171(H) 221(H)  BUN 6 - 20 mg/dL '9 8 8  ' Creatinine 0.61 - 1.24 mg/dL  0.83 0.79 0.87  Sodium 135 - 145 mmol/L 137 140 140  Potassium 3.5 - 5.1 mmol/L 3.8 3.8 3.5  Chloride 98 - 111 mmol/L 103 106 105  CO2 22 - 32 mmol/L 21(L) 26 22  Calcium 8.9 - 10.3 mg/dL 9.3 9.3 9.2  Total Protein 6.5 - 8.1 g/dL 7.2 6.9 6.9  Total Bilirubin 0.3 - 1.2 mg/dL 0.3 0.3 0.3  Alkaline Phos 38 - 126 U/L 90 92 82  AST 15 - 41 U/L 42(H) 40 37  ALT 0 - 44 U/L 46(H) 45(H) 41    No results found for: MPROTEIN No results found for: KPAFRELGTCHN, LAMBDASER, KAPLAMBRATIO   RADIOGRAPHIC STUDIES:  No results found.  ASSESSMENT & PLAN [name] 60 y.o. male with medical history significant for Classical Hodgkin's lymphoma Early Stage/Unfavorable risk who presents for a follow up visit.  After review the labs, the records, review of the PFTs, and review the echocardiogram the findings are most consistent with an early stage/unfavorable risk classical Hodgkin's lymphoma.  Given that he meets unfavorable criteria via multiple guidelines I recommended that we proceed with treatment as recommended in the NCCN guidelines for patients with early stage unfavorable disease (which is the exact same treatment recommended for stage III-IV disease).  Previously we discussed the AVD chemotherapy regimen.  We discussed the expected side effects including possible hair loss, nausea, vomiting, diarrhea, constipation, cytopenias, neutropenia, cardiac dysfunction, and neurological damage.  The patient will be treated with every 2 week rounds of chemotherapy using this regimen. AVD chemotherapy consists of doxorubicin 25 mg/m on days 1 and 15, vinblastine 6 mg per metered squared IV on days 1 and 15, and dacarbazine 375 mg/m IV on days 1 and 15.  After 2 cycles of AVD chemotherapy, PET/CT scan from 02/12/2021 revealed complete response to therapy with no evidence of lymphoma. The recommendation is to proceed with a further 4 cycles of AVD therapy.   # Classical Hodgkin's Lymphoma, Mixed Cellularity. Early  Stage, Unfavorable Risk  --findings are consistent with an Early stage unfavorable risk  disease (due to age, ESR elevation, and mixed cellularity based on EORTC, GHSG, and ECGO criteria) --his disease appears early stage with one clear site of involvement in the neck. There are some smaller lymph nodes in the neck and one near the hepatoduodenal ligament, though would favor diagnosis of early stage --based on these criteria the recommendation would be for AVD x 2 cycles with interval PET CT scan followed by AVD x 4 cycles.  --interval PET on 02/12/2021 showed complete response to therapy --Today is Cycle 6  Day 15 of AVD chemotherapy.  Labs (CBC, CMP, LDH) from today were reviewed without any intervention needed. Patient will proceed with treatment as planned.  --will avoid bleomycin in this patient due to smoking history, mild abnormalities on PFTs and age --RTC in 4-6 weeks with post treatment PET CT scan  #Insomnia -- Occurs predominantly during his first week of chemotherapy. -- Patient is been trying Tylenol PM without relief. -- Current only Ambien 5 mg p.o. daily that he takes 1-2 tablets as needed with some improvement. Sent refill today.  -- Advised to limit screen time 2 hours before bedtime.  -- Continue to monitor.  #Neutropenia, Severe --expected with this chemotherapy regimen. The use of GCSF with this chemotherapy is not routinely recommended as these patients rarely get neutropenic fever despite low ANCs --however, this patient had an episode of febrile neutropenia. Will pursue GCSF therapy following his treatment.   #Supportive Care --chemotherapy education to be scheduled  --zofran 69m q8H PRN and compazine 120mPO q6H for nausea -- EMLA cream for port -- no pain medication required at this time.   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 No biopsy recommended.  --continue to monitor on future scans.  No orders of the defined types were placed in this encounter.  All questions were answered. The patient knows to call the clinic with any problems, questions or concerns.  I have spent a total of 30 minutes minutes of face-to-face and non-face-to-face time, preparing to see the patient, obtaining and/or reviewing separately obtained history, performing a medically appropriate examination, counseling and educating the patient, ordering tests,documenting clinical information in the electronic health record and care coordination.   JoLedell PeoplesMD Department of Hematology/Oncology [facility] We[facility] Digestive Endoscopyhone: [phone]: [phone]: [email or web address]    05/24/2021 11:15 AM

## 2021-05-24 NOTE — Patient Instructions (Signed)

## 2021-05-25 ENCOUNTER — Telehealth: Payer: Self-pay | Admitting: Hematology and Oncology

## 2021-05-25 NOTE — Telephone Encounter (Signed)
Sch per 6/20 los, pt aware

## 2021-05-26 ENCOUNTER — Other Ambulatory Visit: Payer: Self-pay

## 2021-05-26 ENCOUNTER — Inpatient Hospital Stay: Payer: BC Managed Care – PPO

## 2021-05-26 VITALS — BP 123/66 | HR 90 | Temp 98.2°F | Resp 18

## 2021-05-26 DIAGNOSIS — C8121 Mixed cellularity classical Hodgkin lymphoma, lymph nodes of head, face, and neck: Secondary | ICD-10-CM

## 2021-05-26 DIAGNOSIS — G4700 Insomnia, unspecified: Secondary | ICD-10-CM | POA: Diagnosis not present

## 2021-05-26 DIAGNOSIS — Z5111 Encounter for antineoplastic chemotherapy: Secondary | ICD-10-CM | POA: Diagnosis not present

## 2021-05-26 DIAGNOSIS — Z79899 Other long term (current) drug therapy: Secondary | ICD-10-CM | POA: Diagnosis not present

## 2021-05-26 MED ORDER — PEGFILGRASTIM-CBQV 6 MG/0.6ML ~~LOC~~ SOSY
PREFILLED_SYRINGE | SUBCUTANEOUS | Status: AC
  Filled 2021-05-26: qty 0.6

## 2021-05-26 MED ORDER — PEGFILGRASTIM-CBQV 6 MG/0.6ML ~~LOC~~ SOSY
6.0000 mg | PREFILLED_SYRINGE | Freq: Once | SUBCUTANEOUS | Status: AC
  Administered 2021-05-26: 6 mg via SUBCUTANEOUS

## 2021-05-26 NOTE — Patient Instructions (Signed)

## 2021-06-21 ENCOUNTER — Other Ambulatory Visit: Payer: Self-pay

## 2021-06-21 ENCOUNTER — Ambulatory Visit (HOSPITAL_COMMUNITY)
Admission: RE | Admit: 2021-06-21 | Discharge: 2021-06-21 | Disposition: A | Discharge status: Home / Self Care | Payer: BC Managed Care – PPO | Source: Ambulatory Visit | Attending: Hematology and Oncology | Admitting: Hematology and Oncology

## 2021-06-21 DIAGNOSIS — C8121 Mixed cellularity classical Hodgkin lymphoma, lymph nodes of head, face, and neck: Secondary | ICD-10-CM | POA: Diagnosis not present

## 2021-06-21 LAB — GLUCOSE, CAPILLARY: Glucose-Capillary: 113 mg/dL — ABNORMAL HIGH (ref 70–99)

## 2021-06-21 MED ORDER — FLUDEOXYGLUCOSE F - 18 (FDG) INJECTION
12.5500 | Freq: Once | INTRAVENOUS | Status: AC
  Administered 2021-06-21: 12.5 via INTRAVENOUS

## 2021-06-24 ENCOUNTER — Other Ambulatory Visit: Payer: Self-pay | Admitting: Hematology and Oncology

## 2021-06-24 ENCOUNTER — Telehealth: Payer: Self-pay | Admitting: *Deleted

## 2021-06-24 DIAGNOSIS — C8121 Mixed cellularity classical Hodgkin lymphoma, lymph nodes of head, face, and neck: Secondary | ICD-10-CM

## 2021-06-24 NOTE — Telephone Encounter (Signed)
-----   Message from Orson Slick, MD sent at 06/23/2021  8:31 AM EDT ----- Please let [name] know that his PET CT scan showed no evidence of any residual disease. Based on these findings, his Hodgkin Lymphoma is in complete remission. We will see him back in early August 2022, then likely q 3 months after that.   ----- Message ----- From: Interface, Rad Results In Sent: 06/22/2021   1:25 PM EDT To: Orson Slick, MD

## 2021-06-24 NOTE — Telephone Encounter (Signed)
[name] regarding recent scan. Spoke with him and advised that his PET scan showed no residual disease and is considered to be in complete remission.  [name] is aware of his appt in August.  He would like to get his port out soon and get back to work. Advised thati will ask Dr. Lorenso Courier about both of those. Pt very pleased to be in remission!

## 2021-06-25 ENCOUNTER — Telehealth: Payer: Self-pay | Admitting: *Deleted

## 2021-06-25 ENCOUNTER — Encounter: Payer: Self-pay | Admitting: *Deleted

## 2021-06-25 NOTE — Telephone Encounter (Signed)
[name] regarding getting his port removed and his ability to go back to work. Spoke with him and advised that Dr. Lorenso Courier has placed an order to get his port removed (scheduled on 07/22/21) and that he can, indeed, go back to work without restrictions. Pt very pleased with this. 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 He states he will pick it up on Monday, 06/28/21

## 2021-07-05 ENCOUNTER — Other Ambulatory Visit: Payer: Self-pay

## 2021-07-05 ENCOUNTER — Inpatient Hospital Stay (HOSPITAL_BASED_OUTPATIENT_CLINIC_OR_DEPARTMENT_OTHER): Payer: BC Managed Care – PPO | Admitting: Hematology and Oncology

## 2021-07-05 ENCOUNTER — Inpatient Hospital Stay: Payer: BC Managed Care – PPO | Attending: Hematology and Oncology

## 2021-07-05 VITALS — BP 144/82 | HR 85 | Temp 97.8°F | Resp 18 | Wt 256.1 lb

## 2021-07-05 DIAGNOSIS — Z5111 Encounter for antineoplastic chemotherapy: Secondary | ICD-10-CM

## 2021-07-05 DIAGNOSIS — D709 Neutropenia, unspecified: Secondary | ICD-10-CM | POA: Diagnosis not present

## 2021-07-05 DIAGNOSIS — G4700 Insomnia, unspecified: Secondary | ICD-10-CM | POA: Diagnosis not present

## 2021-07-05 DIAGNOSIS — C8121 Mixed cellularity classical Hodgkin lymphoma, lymph nodes of head, face, and neck: Secondary | ICD-10-CM

## 2021-07-05 DIAGNOSIS — R5081 Fever presenting with conditions classified elsewhere: Secondary | ICD-10-CM | POA: Diagnosis not present

## 2021-07-05 DIAGNOSIS — Z95828 Presence of other vascular implants and grafts: Secondary | ICD-10-CM | POA: Diagnosis not present

## 2021-07-05 LAB — CBC WITH DIFFERENTIAL (CANCER CENTER ONLY)
Abs Immature Granulocytes: 0.01 10*3/uL (ref 0.00–0.07)
Basophils Absolute: 0.1 10*3/uL (ref 0.0–0.1)
Basophils Relative: 1 %
Eosinophils Absolute: 0.3 10*3/uL (ref 0.0–0.5)
Eosinophils Relative: 6 %
HCT: 37.1 % — ABNORMAL LOW (ref 39.0–52.0)
Hemoglobin: 12.4 g/dL — ABNORMAL LOW (ref 13.0–17.0)
Immature Granulocytes: 0 %
Lymphocytes Relative: 17 %
Lymphs Abs: 1 10*3/uL (ref 0.7–4.0)
MCH: 29.9 pg (ref 26.0–34.0)
MCHC: 33.4 g/dL (ref 30.0–36.0)
MCV: 89.4 fL (ref 80.0–100.0)
Monocytes Absolute: 0.5 10*3/uL (ref 0.1–1.0)
Monocytes Relative: 8 %
Neutro Abs: 3.8 10*3/uL (ref 1.7–7.7)
Neutrophils Relative %: 68 %
Platelet Count: 144 10*3/uL — ABNORMAL LOW (ref 150–400)
RBC: 4.15 MIL/uL — ABNORMAL LOW (ref 4.22–5.81)
RDW: 13.8 % (ref 11.5–15.5)
WBC Count: 5.6 10*3/uL (ref 4.0–10.5)
nRBC: 0 % (ref 0.0–0.2)

## 2021-07-05 LAB — CMP (CANCER CENTER ONLY)
ALT: 43 U/L (ref 0–44)
AST: 34 U/L (ref 15–41)
Albumin: 3.9 g/dL (ref 3.5–5.0)
Alkaline Phosphatase: 60 U/L (ref 38–126)
Anion gap: 9 (ref 5–15)
BUN: 9 mg/dL (ref 6–20)
CO2: 24 mmol/L (ref 22–32)
Calcium: 9.4 mg/dL (ref 8.9–10.3)
Chloride: 108 mmol/L (ref 98–111)
Creatinine: 0.85 mg/dL (ref 0.61–1.24)
GFR, Estimated: >60 mL/min (ref >60)
Glucose, Bld: 171 mg/dL — ABNORMAL HIGH (ref 70–99)
Potassium: 3.6 mmol/L (ref 3.5–5.1)
Sodium: 141 mmol/L (ref 135–145)
Total Bilirubin: 0.3 mg/dL (ref 0.3–1.2)
Total Protein: 6.9 g/dL (ref 6.5–8.1)

## 2021-07-05 LAB — LACTATE DEHYDROGENASE: LDH: 144 U/L (ref 98–192)

## 2021-07-05 NOTE — Progress Notes (Signed)
[facility] [phone]   [phone]  PROGRESS NOTE  Patient Care Team: London Pepper, MD as PCP - General (Family Medicine)  Hematological/Oncological History # Classical Hodgkin's Lymphoma, Mixed Cellularity. 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 2) 10/27/2020: resection of neck mass. Pathology showed classical Hodgkin's lymphoma, mixed cellularity subtype 3) 11/13/2020: establish care with Dr. Lorenso Courier  4) 11/26/2020: PET CT scan shows single hypermetabolic unenlarged right level II cervical lymph node identified on today's study. FDG uptake is compatible with [place] 4. Other small bilateral cervical and upper normal to borderline hepatoduodenal ligament lymph nodes show [place] 2 uptake levels 5) 12/18/2020: Cycle 1 Day 1 of AVD chemotherapy  6) 01/15/2021: Cycle 2 Day 1 of AVD chemotherapy  7) 02/12/2021: PET CT scan shows no evidence of residual disease 8) 02/15/2021: Cycle 3 Day 1 of AVD chemotherapy  9) 03/01/2021: Cycle 3 Day 15 of AVD chemotherapy  10) 03/15/2021: Cycle 4 Day 1 of AVD chemotherapy  11) 03/29/2021: Cycle 4 Day 15 of AVD chemotherapy 12) 04/12/2021: Cycle 5 Day 1 of AVD chemotherapy 13) 04/26/2021: Cycle 5 Day 15 of AVD chemotherapy 14) 05/10/2021: Cycle 6 Day 1 of AVD chemotherapy 15) 05/24/2021: Cycle 6 Day 15 of AVD chemotherapy 16) 06/21/2021: PET CT scan shows no evidence of residual/recurrent lymphoma. Patient in complete remission, enter surviellance.   Interval History:  [name] 60 y.o. male with medical history significant for Classical Hodgkin's lymphoma Early Stage/Unfavorable risk who presents for a follow up visit. The patient's last visit was on 05/24/2021. In the interim since the last visit he had his post treatment PET CT scan which confirmed a complete response.   On exam today Mr. [name] reports he "feels  normal" and is recovering well from his chemotherapy.  He notes that he is having some trouble with his eyesight with some blurry vision.  He notes also increased number of cavities in his teeth both of which she associates with the chemotherapy.  He notes that his energy level has been okay and that his weight is increasing.  He is excited to be going back to work tomorrow.  His port is going to be removed on 07/22/2021 and he has a visit with his regular doctor on 07/19/2021.  He currently denies any fevers, chills, night sweats, changes in shortness of breath, chest pain or cough.  He has no other complaints. A full 10 point ROS is listed below.  MEDICAL HISTORY:  Past Medical History:  Diagnosis Date   Cancer ([place]) 10/15   recent dx prostate ca-no tx yet   GERD (gastroesophageal reflux disease)    Hypertension    Pneumonia    history   Teeth decayed     SURGICAL HISTORY: Past Surgical History:  Procedure Laterality Date   IR IMAGING GUIDED PORT INSERTION  12/11/2020   MASS BIOPSY Left 09/29/2014   Procedure: LEFT LOWER NECK MASS EXCISION;  Surgeon: Izora Gala, MD;  Location: [facility];  Service: ENT;  Laterality: Left;   PAROTIDECTOMY Left 09/29/2014   Procedure: LEFT PAROTIDECTOMY;  Surgeon: Izora Gala, MD;  Location: [facility];  Service: ENT;  Laterality: Left;   SEPTOPLASTY  1976    SOCIAL HISTORY: Social History   Socioeconomic History   Marital status: Widowed    Spouse name: Not on file   Number of children: Not on file   Years  of education: Not on file   Highest education level: Not on file  Occupational History   Not on file  Tobacco Use   Smoking status: Former    Packs/day: 3.00    Years: 32.00    Pack years: 96.00    Types: Cigarettes    Quit date: 11/26/2017    Years since quitting: 3.6   Smokeless tobacco: Never  Vaping Use   Vaping Use: Never used  Substance and Sexual Activity   Alcohol use: Yes    Comment: twice a year    Drug use: No   Sexual activity: Not on file  Other Topics Concern   Not on file  Social History Narrative   Not on file   Social Determinants of Health   Financial Resource Strain: Not on file  Food Insecurity: Not on file  Transportation Needs: Not on file  Physical Activity: Not on file  Stress: Not on file  Social Connections: Not on file  Intimate Partner Violence: Not on file    FAMILY HISTORY: Family History  Problem Relation Age of Onset   Stroke Mother    Heart disease Father     ALLERGIES:  has No Known Allergies.  MEDICATIONS:  Current Outpatient Medications  Medication Sig Dispense Refill   acetaminophen (TYLENOL) 500 MG tablet Take 500 mg by mouth every 6 (six) hours as needed for headache.     amLODipine (NORVASC) 5 MG tablet Take 5 mg by mouth every evening.     B Complex-Folic Acid (B COMPLEX FORMULA 1, W/ FA,) TABS Take 1 tablet by mouth daily. 30 tablet 3   losartan (COZAAR) 100 MG tablet Take 100 mg by mouth every evening.     metFORMIN (GLUCOPHAGE) 500 MG tablet Take 1 tablet by mouth 2 (two) times daily.     pantoprazole (PROTONIX) 40 MG tablet Take 1 tablet by mouth every evening.     rosuvastatin (CRESTOR) 5 MG tablet Take 5 mg by mouth every evening.     No current facility-administered medications for this visit.   Facility-Administered Medications Ordered in Other Visits  Medication Dose Route Frequency Provider Last Rate Last Admin   0.9 %  sodium chloride infusion   Intravenous Continuous Allred, Darrell K, PA-C        REVIEW OF SYSTEMS:   Constitutional: ( - ) fevers, ( - )  chills , ( - ) night sweats Eyes: ( - ) blurriness of vision, ( - ) double vision, ( - ) watery eyes Ears, nose, mouth, throat, and face: ( - ) mucositis, ( - ) sore throat Respiratory: ( - ) cough, ( - ) dyspnea, ( - ) wheezes Cardiovascular: ( - ) palpitation, ( - ) chest discomfort, ( - ) lower extremity swelling Gastrointestinal:  ( - ) nausea, ( - ) heartburn,  ( - ) change in bowel habits Skin: ( - ) abnormal skin rashes Lymphatics: ( - ) new lymphadenopathy, ( - ) easy bruising Neurological: ( - ) numbness, ( - ) tingling, ( - ) new weaknesses Behavioral/Psych: ( - ) mood change, ( - ) new changes  All other systems were reviewed with the patient and are negative.  PHYSICAL EXAMINATION: ECOG PERFORMANCE STATUS: 1 - Symptomatic but completely ambulatory  Vitals:   07/05/21 1104  BP: (!) 144/82  Pulse: 85  Resp: 18  Temp: 97.8 F (36.6 C)  SpO2: 97%    Filed Weights   07/05/21 1104  Weight: 256 lb 1.6  oz (116.2 kg)     GENERAL: well appearing middle aged Caucasian male in NAD SKIN: skin color, texture, turgor are normal, no rashes or significant lesions EYES: conjunctiva are pink and non-injected, sclera clear LUNGS: clear to auscultation and percussion with normal breathing effort HEART: regular rate & rhythm and no murmurs and no lower extremity edema Musculoskeletal: no cyanosis of digits and no clubbing  PSYCH: alert & oriented x 3, fluent speech NEURO: no focal motor/sensory deficits  LABORATORY DATA:  I have reviewed the data as listed CBC Latest Ref Rng & Units 07/05/2021 05/24/2021 05/10/2021  WBC 4.0 - 10.5 K/uL 5.6 10.3 12.2(H)  Hemoglobin 13.0 - 17.0 g/dL 12.4(L) 11.5(L) 11.8(L)  Hematocrit 39.0 - 52.0 % 37.1(L) 35.6(L) 35.3(L)  Platelets 150 - 400 K/uL 144(L) 162 160    CMP Latest Ref Rng & Units 07/05/2021 05/24/2021 05/10/2021  Glucose 70 - 99 mg/dL 171(H) 188(H) 267(H)  BUN 6 - 20 mg/dL '9 13 9  ' Creatinine 0.61 - 1.24 mg/dL 0.85 0.70 0.83  Sodium 135 - 145 mmol/L 141 139 137  Potassium 3.5 - 5.1 mmol/L 3.6 3.7 3.8  Chloride 98 - 111 mmol/L 108 105 103  CO2 22 - 32 mmol/L 24 24 21(L)  Calcium 8.9 - 10.3 mg/dL 9.4 9.3 9.3  Total Protein 6.5 - 8.1 g/dL 6.9 7.5 7.2  Total Bilirubin 0.3 - 1.2 mg/dL 0.3 <0.1(L) 0.3  Alkaline Phos 38 - 126 U/L 60 80 90  AST 15 - 41 U/L 34 39 42(H)  ALT 0 - 44 U/L 43 45(H) 46(H)    No  results found for: MPROTEIN No results found for: KPAFRELGTCHN, LAMBDASER, KAPLAMBRATIO   RADIOGRAPHIC STUDIES:  NM PET Image Restag (PS) Skull Base To Thigh  Result Date: 06/22/2021 CLINICAL DATA:  Subsequent treatment strategy for mixed cellularity Hodgkin's lymphoma. EXAM: NUCLEAR MEDICINE PET SKULL BASE TO THIGH TECHNIQUE: 12.5 mCi F-18 FDG was injected intravenously. Full-ring PET imaging was performed from the skull base to thigh after the radiotracer. CT data was obtained and used for attenuation correction and anatomic localization. Fasting blood glucose: 113 mg/dl COMPARISON:  PET-CT 02/12/2021 FINDINGS: Mediastinal blood pool activity: SUV max 2.73 Liver activity: SUV max 3.67 NECK: Stable small nodule in the right parotid gland with mild hypermetabolism and SUV max of 3.42. No enlarged or hypermetabolic neck adenopathy. Small scattered lymph nodes are stable. Incidental CT findings: None CHEST: No hypermetabolic mediastinal or hilar nodes. No suspicious pulmonary nodules on the CT scan. Incidental CT findings: Stable aortic calcifications. The right IJ Port-A-Cath is in good position. No complicating features. Stable small subcutaneous nodules most consistent with benign sebaceous cysts. ABDOMEN/PELVIS: No abnormal hypermetabolic activity within the liver, pancreas, adrenal glands, or spleen. No hypermetabolic lymph nodes in the abdomen or pelvis. Incidental CT findings: Mild fatty infiltration of the liver. Stable scattered aortic and iliac artery calcifications but no aneurysm. Stable small periumbilical abdominal wall hernia containing fat. SKELETON: No focal hypermetabolic activity to suggest skeletal metastasis. Incidental CT findings: none IMPRESSION: 1. No findings for recurrent lymphoma. No enlarged or hypermetabolic nodes are identified. 2. Persistent small hypermetabolic right parotid gland nodule. 3. Incidental findings as detailed above. Electronically Signed   By: Marijo Sanes M.D.    On: 06/22/2021 13:23    ASSESSMENT & PLAN [name] 60 y.o. male with medical history significant for Classical Hodgkin's lymphoma Early Stage/Unfavorable risk who presents for a follow up visit.  After review the labs, the records, review of the PFTs, and review  the echocardiogram the findings are most consistent with an early stage/unfavorable risk classical Hodgkin's lymphoma.  Given that he meets unfavorable criteria via multiple guidelines I recommended that we proceed with treatment as recommended in the NCCN guidelines for patients with early stage unfavorable disease (which is the exact same treatment recommended for stage III-IV disease).  Previously we discussed the AVD chemotherapy regimen.  We discussed the expected side effects including possible hair loss, nausea, vomiting, diarrhea, constipation, cytopenias, neutropenia, cardiac dysfunction, and neurological damage.  The patient will be treated with every 2 week rounds of chemotherapy using this regimen. AVD chemotherapy consists of doxorubicin 25 mg/m on days 1 and 15, vinblastine 6 mg per metered squared IV on days 1 and 15, and dacarbazine 375 mg/m IV on days 1 and 15.  After 2 cycles of AVD chemotherapy, PET/CT scan from 02/12/2021 revealed complete response to therapy with no evidence of lymphoma. The recommendation is to proceed with a further 4 cycles of AVD therapy.   # Classical Hodgkin's Lymphoma, Mixed Cellularity. 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  --findings are consistent with an Early stage unfavorable risk disease (due to age, ESR elevation, and mixed cellularity based on EORTC, GHSG, and ECGO criteria) --his disease appears early stage with one clear site of involvement in the neck. There are some smaller lymph nodes in the neck and one near the hepatoduodenal ligament, though would favor diagnosis of early stage --based on these criteria the recommendation would be for AVD x 2 cycles with interval PET CT  scan followed by AVD x 4 cycles.  --interval PET on 06/21/2021 showed complete response to therapy --we avoided bleomycin in this patient due to smoking history, mild abnormalities on PFTs and age Plan: -- Labs (CBC, CMP, LDH) from today were reviewed without any intervention needed.  --RTC in 3 months with f/u CT scan.   #Insomnia, improved -- Occurred predominantly during his first week of chemotherapy. -- Patient d/c ambien due to improvement. -- Continue to monitor.  #Neutropenia, Severe- resolved.  --expected with this chemotherapy regimen. The use of GCSF with this chemotherapy is not routinely recommended as these patients rarely get neutropenic fever despite low ANCs --however, this patient had an episode of febrile neutropenia. Will pursue GCSF therapy following his treatment.   #Supportive Care --chemotherapy education complete --port to be removed on 07/22/2021.  --zofran 68m q8H PRN and compazine 162mPO q6H for nausea -- EMLA cream for port -- no pain medication required at this time.   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 No biopsy recommended.  --continue to monitor on future scans.  Orders Placed This Encounter  Procedures   CT CHEST ABDOMEN PELVIS W CONTRAST    Standing Status:   Future    Standing Expiration Date:   07/05/2022    Order Specific Question:   If indicated for the ordered procedure, I authorize the administration of contrast media per Radiology protocol    Answer:   Yes    Order Specific Question:   [facility]?    [facility]  Order Specific Question:   Is Oral Contrast requested for this exam?    Answer:   Yes, Per Radiology protocol    Order Specific Question:   Reason for Exam (SYMPTOM  OR DIAGNOSIS REQUIRED)  Answer:   s/p  treatment for Hodgkin lymphoma, in complete remission    All questions were answered. The patient knows to call the clinic with any problems, questions or concerns.  I have spent a total of 30 minutes minutes of face-to-face and non-face-to-face time, preparing to see the patient, obtaining and/or reviewing separately obtained history, performing a medically appropriate examination, counseling and educating the patient, ordering tests,documenting clinical information in the electronic health record and care coordination.   Ledell Peoples, MD Department of Hematology/Oncology [place] [facility] Phone: [phone] Pager: [phone] Email: [email or web address]    07/05/2021 11:35 AM

## 2021-07-07 ENCOUNTER — Telehealth: Payer: Self-pay | Admitting: Hematology and Oncology

## 2021-07-07 NOTE — Telephone Encounter (Signed)
Scheduled per los. Called, not able to leave msg. Mailed printout  

## 2021-07-19 DIAGNOSIS — E1169 Type 2 diabetes mellitus with other specified complication: Secondary | ICD-10-CM | POA: Diagnosis not present

## 2021-07-19 DIAGNOSIS — E785 Hyperlipidemia, unspecified: Secondary | ICD-10-CM | POA: Diagnosis not present

## 2021-07-19 DIAGNOSIS — I10 Essential (primary) hypertension: Secondary | ICD-10-CM | POA: Diagnosis not present

## 2021-07-21 ENCOUNTER — Other Ambulatory Visit: Payer: Self-pay | Admitting: Internal Medicine

## 2021-07-22 ENCOUNTER — Encounter (HOSPITAL_COMMUNITY): Payer: Self-pay

## 2021-07-22 ENCOUNTER — Ambulatory Visit (HOSPITAL_COMMUNITY)
Admission: RE | Admit: 2021-07-22 | Discharge: 2021-07-22 | Disposition: A | Discharge status: Home / Self Care | Payer: BC Managed Care – PPO | Source: Ambulatory Visit | Attending: Hematology and Oncology | Admitting: Hematology and Oncology

## 2021-07-22 ENCOUNTER — Other Ambulatory Visit: Payer: Self-pay

## 2021-07-22 DIAGNOSIS — Z7984 Long term (current) use of oral hypoglycemic drugs: Secondary | ICD-10-CM | POA: Diagnosis not present

## 2021-07-22 DIAGNOSIS — Z87891 Personal history of nicotine dependence: Secondary | ICD-10-CM | POA: Diagnosis not present

## 2021-07-22 DIAGNOSIS — K219 Gastro-esophageal reflux disease without esophagitis: Secondary | ICD-10-CM | POA: Diagnosis not present

## 2021-07-22 DIAGNOSIS — Z79899 Other long term (current) drug therapy: Secondary | ICD-10-CM | POA: Diagnosis not present

## 2021-07-22 DIAGNOSIS — Z452 Encounter for adjustment and management of vascular access device: Secondary | ICD-10-CM | POA: Insufficient documentation

## 2021-07-22 DIAGNOSIS — C8190 Hodgkin lymphoma, unspecified, unspecified site: Secondary | ICD-10-CM | POA: Diagnosis not present

## 2021-07-22 DIAGNOSIS — C8121 Mixed cellularity classical Hodgkin lymphoma, lymph nodes of head, face, and neck: Secondary | ICD-10-CM

## 2021-07-22 DIAGNOSIS — I10 Essential (primary) hypertension: Secondary | ICD-10-CM | POA: Insufficient documentation

## 2021-07-22 HISTORY — PX: IR REMOVAL TUN ACCESS W/ PORT W/O FL MOD SED: IMG2290

## 2021-07-22 LAB — GLUCOSE, CAPILLARY: Glucose-Capillary: 97 mg/dL (ref 70–99)

## 2021-07-22 MED ORDER — FENTANYL CITRATE (PF) 100 MCG/2ML IJ SOLN
INTRAMUSCULAR | Status: AC
  Filled 2021-07-22: qty 2

## 2021-07-22 MED ORDER — FENTANYL CITRATE (PF) 100 MCG/2ML IJ SOLN
INTRAMUSCULAR | Status: AC | PRN
  Administered 2021-07-22 (×2): 50 ug via INTRAVENOUS

## 2021-07-22 MED ORDER — LIDOCAINE HCL 1 % IJ SOLN
INTRAMUSCULAR | Status: AC
  Filled 2021-07-22: qty 20

## 2021-07-22 MED ORDER — MIDAZOLAM HCL 2 MG/2ML IJ SOLN
INTRAMUSCULAR | Status: AC | PRN
  Administered 2021-07-22 (×2): 1 mg via INTRAVENOUS

## 2021-07-22 MED ORDER — LIDOCAINE-EPINEPHRINE 1 %-1:100000 IJ SOLN
INTRAMUSCULAR | Status: AC | PRN
  Administered 2021-07-22: 10 mL

## 2021-07-22 MED ORDER — MIDAZOLAM HCL 2 MG/2ML IJ SOLN
INTRAMUSCULAR | Status: AC
  Filled 2021-07-22: qty 2

## 2021-07-22 MED ORDER — LIDOCAINE-EPINEPHRINE 1 %-1:100000 IJ SOLN
INTRAMUSCULAR | Status: AC
  Filled 2021-07-22: qty 1

## 2021-07-22 MED ORDER — SODIUM CHLORIDE 0.9 % IV SOLN: INTRAVENOUS | Status: DC

## 2021-07-22 NOTE — Discharge Instructions (Signed)
Interventional radiology phone numbers [phone] After hours [phone]  Implanted Port Removal, Care After This sheet gives you information about how to care for yourself after your procedure. Your health care provider may also give you more specific instructions. If you have problems or questions, contact your health care provider. What can I expect after the procedure? After the procedure, it is common to have: Soreness or pain near your incision. Some swelling or bruising near your incision. Follow these instructions at home: Medicines Take over-the-counter and prescription medicines only as told by your health care provider. If you were prescribed an antibiotic medicine, take it as told by your health care provider. Do not stop taking the antibiotic even if you start to feel better. Bathing Do not take baths, swim, or use a hot tub until your health care provider approves. You may shower tomorrow. Remove your dressing prior to your shower. Incision care Follow instructions from your health care provider about how to take care of your incision. Make sure you: Wash your hands with soap and water before you change your bandage (dressing). If soap and water are not available, use hand sanitizer. Keep your dressing dry. Leave  skin glue in place. These skin closures may need to stay in place for 2 weeks or longer. . Check your incision area every day for signs of infection. Check for: More redness, swelling, or pain. More fluid or blood. Warmth. Pus or a bad smell.   Driving Do not drive for 24 hours if you were given a medicine to help you relax (sedative) during your procedure. If you did not receive a sedative, ask your health care provider when it is safe to drive.   Activity Return to your normal activities as told by your health care provider. Ask your health care provider what activities are safe for you. Do not lift anything that is heavier than 10 lb (4.5 kg), or the  limit that you are told, until your health care provider says that it is safe. Do not do activities that involve lifting your arms over your head. General instructions Do not use any products that contain nicotine or tobacco, such as cigarettes and e-cigarettes. These can delay healing. If you need help quitting, ask your health care provider. Keep all follow-up visits as told by your health care provider. This is important. Contact a health care provider if: You have more redness, swelling, or pain around your incision. You have more fluid or blood coming from your incision. Your incision feels warm to the touch. You have pus or a bad smell coming from your incision. You have pain that is not relieved by your pain medicine. Get help right away if you have: A fever or chills. Chest pain. Difficulty breathing. Summary After the procedure, it is common to have pain, soreness, swelling, or bruising near your incision. If you were prescribed an antibiotic medicine, take it as told by your health care provider. Do not stop taking the antibiotic even if you start to feel better. Do not drive for 24 hours if you were given a sedative during your procedure. Return to your normal activities as told by your health care provider. Ask your health care provider what activities are safe for you. This information is not intended to replace advice given to you by your health care provider. Make sure you discuss any questions you have with your health care provider. Document Revised: 01/04/2018 Document Reviewed: 01/04/2018 Elsevier Patient Education  2021 Elsevier Inc.       Moderate Conscious Sedation, Adult, Care After This sheet gives you information about how to care for yourself after your procedure. Your health care provider may also give you more specific instructions. If you have problems or questions, contact your health care provider. What can I expect after the procedure? After the procedure,  it is common to have: Sleepiness for several hours. Impaired judgment for several hours. Difficulty with balance. Vomiting if you eat too soon. Follow these instructions at home: For the time period you were told by your health care provider: Rest. Do not participate in activities where you could fall or become injured. Do not drive or use machinery. Do not drink alcohol. Do not take sleeping pills or medicines that cause drowsiness. Do not make important decisions or sign legal documents. Do not take care of children on your own.      Eating and drinking Follow the diet recommended by your health care provider. Drink enough fluid to keep your urine pale yellow. If you vomit: Drink water, juice, or soup when you can drink without vomiting. Make sure you have little or no nausea before eating solid foods.   General instructions Take over-the-counter and prescription medicines only as told by your health care provider. Have a responsible adult stay with you for the time you are told. It is important to have someone help care for you until you are awake and alert. Do not smoke. Keep all follow-up visits as told by your health care provider. This is important. Contact a health care provider if: You are still sleepy or having trouble with balance after 24 hours. You feel light-headed. You keep feeling nauseous or you keep vomiting. You develop a rash. You have a fever. You have redness or swelling around the IV site. Get help right away if: You have trouble breathing. You have new-onset confusion at home. Summary After the procedure, it is common to feel sleepy, have impaired judgment, or feel nauseous if you eat too soon. Rest after you get home. Know the things you should not do after the procedure. Follow the diet recommended by your health care provider and drink enough fluid to keep your urine pale yellow. Get help right away if you have trouble breathing or new-onset  confusion at home. This information is not intended to replace advice given to you by your health care provider. Make sure you discuss any questions you have with your health care provider. Document Revised: 03/20/2020 Document Reviewed: 10/17/2019 Elsevier Patient Education  2021 Elsevier Inc.  

## 2021-07-22 NOTE — Procedures (Signed)
Interventional Radiology Procedure Note ° °Procedure: RT IJ PORT REMOVAL   ° °Complications: None ° °Estimated Blood Loss:  MIN ° °Findings: °FULL REPORT IN PACS °   ° °M. TREVOR Artrell Lawless, MD ° ° ° °

## 2021-07-22 NOTE — H&P (Signed)
Chief Complaint: Patient was seen in consultation today for port-a-cath removal   at the request of [place] T IV  Referring Physician(s): Dorsey,John T IV  Supervising Physician: Daryll Brod  Patient Status: [facility] [address] - Out-pt  History of Present Illness: [name] is a 60 y.o. male w/ hx of Hodgkin's Lymphoma, prostate cancer, HTN, PNA and GERD. He is here today for port-a-cath removal d/t recent completion of chemotherapy as he is in complete remission from Hodgkin's Lymphoma.   Pt sitting up in bed. He is pleasant, calm and cooperative. He states that he is ready to "get this thing out". Pt reports he ate at 0730 this morning.  He is in NAD  Past Medical History:  Diagnosis Date   Cancer ([place]) 10/15   recent dx prostate ca-no tx yet   GERD (gastroesophageal reflux disease)    Hypertension    Pneumonia    history   Teeth decayed     Past Surgical History:  Procedure Laterality Date   IR IMAGING GUIDED PORT INSERTION  12/11/2020   MASS BIOPSY Left 09/29/2014   Procedure: LEFT LOWER NECK MASS EXCISION;  Surgeon: Izora Gala, MD;  Location: [place];  Service: ENT;  Laterality: Left;   PAROTIDECTOMY Left 09/29/2014   Procedure: LEFT PAROTIDECTOMY;  Surgeon: Izora Gala, MD;  Location: [place];  Service: ENT;  Laterality: Left;   SEPTOPLASTY  1976    Allergies: Patient has no known allergies.  Medications: Prior to Admission medications   Medication Sig Start Date End Date Taking? Authorizing Provider  acetaminophen (TYLENOL) 500 MG tablet Take 500 mg by mouth every 6 (six) hours as needed for headache.   Yes [provider]  amLODipine (NORVASC) 5 MG tablet Take 5 mg by mouth every evening.   Yes [provider]  B Complex-Folic Acid (B COMPLEX FORMULA 1, W/ FA,) TABS Take 1 tablet by mouth daily. 01/01/21  Yes Brunetta Genera, MD  losartan (COZAAR) 100 MG tablet Take 100 mg by mouth every evening. 11/01/20   Yes [provider]  metFORMIN (GLUCOPHAGE) 500 MG tablet Take 1 tablet by mouth 2 (two) times daily. 08/22/19  Yes [provider]  pantoprazole (PROTONIX) 40 MG tablet Take 1 tablet by mouth every evening. 08/14/19  Yes [provider]  rosuvastatin (CRESTOR) 5 MG tablet Take 5 mg by mouth every evening. 10/28/20  Yes [provider]     Family History  Problem Relation Age of Onset   Stroke Mother    Heart disease Father     Social History   Socioeconomic History   Marital status: Widowed    Spouse name: Not on file   Number of children: Not on file   Years of education: Not on file   Highest education level: Not on file  Occupational History   Not on file  Tobacco Use   Smoking status: Former    Packs/day: 3.00    Years: 32.00    Pack years: 96.00    Types: Cigarettes    Quit date: 11/26/2017    Years since quitting: 3.6   Smokeless tobacco: Never  Vaping Use   Vaping Use: Never used  Substance and Sexual Activity   Alcohol use: Yes    Comment: twice a year   Drug use: No   Sexual activity: Not on file  Other Topics Concern   Not on file  Social History Narrative   Not on file   Social  Determinants of Health   Financial Resource Strain: Not on file  Food Insecurity: Not on file  Transportation Needs: Not on file  Physical Activity: Not on file  Stress: Not on file  Social Connections: Not on file     Review of Systems  Constitutional:  Negative for chills and fever.  Respiratory:  Negative for cough and shortness of breath.   Cardiovascular:  Negative for chest pain.  Gastrointestinal:  Negative for abdominal pain, nausea and vomiting.   Vital Signs: BP (!) 147/81   Pulse 79   Temp 98.4 F (36.9 C) (Oral)   Resp 18   SpO2 98%   Physical Exam Constitutional:      Appearance: Normal appearance.  HENT:     Mouth/Throat:     Mouth: Mucous membranes are moist.     Pharynx: Oropharynx is clear.  Cardiovascular:      Rate and Rhythm: Normal rate and regular rhythm.     Pulses: Normal pulses.     Heart sounds: Normal heart sounds.  Pulmonary:     Effort: Pulmonary effort is normal.     Breath sounds: Normal breath sounds. No stridor. No wheezing, rhonchi or rales.  Abdominal:     Palpations: Abdomen is soft.     Tenderness: There is no abdominal tenderness. There is no guarding.  Musculoskeletal:     Right lower leg: No edema.     Left lower leg: No edema.  Skin:    General: Skin is warm and dry.  Neurological:     Mental Status: He is alert and oriented to person, place, and time.  Psychiatric:        Mood and Affect: Mood normal.        Behavior: Behavior normal.        Thought Content: Thought content normal.        Judgment: Judgment normal.    Imaging: No results found.  Labs:  CBC: Recent Labs    04/26/21 1031 05/10/21 1003 05/24/21 1041 07/05/21 1047  WBC 16.4* 12.2* 10.3 5.6  HGB 11.7* 11.8* 11.5* 12.4*  HCT 36.2* 35.3* 35.6* 37.1*  PLT 174 160 162 144*    COAGS: Recent Labs    12/11/20 1003 01/17/21 1811  INR 1.1 1.1  APTT  --  27    BMP: Recent Labs    04/26/21 1031 05/10/21 1003 05/24/21 1041 07/05/21 1047  NA 140 137 139 141  K 3.8 3.8 3.7 3.6  CL 106 103 105 108  CO2 26 21* 24 24  GLUCOSE 171* 267* 188* 171*  BUN '8 9 13 9  '$ CALCIUM 9.3 9.3 9.3 9.4  CREATININE 0.79 0.83 0.70 0.85  GFRNONAA >60 >60 >60 >60    LIVER FUNCTION TESTS: Recent Labs    04/26/21 1031 05/10/21 1003 05/24/21 1041 07/05/21 1047  BILITOT 0.3 0.3 <0.1* 0.3  AST 40 42* 39 34  ALT 45* 46* 45* 43  ALKPHOS 92 90 80 60  PROT 6.9 7.2 7.5 6.9  ALBUMIN 3.8 3.8 4.2 3.9    TUMOR MARKERS: No results for input(s): AFPTM, CEA, CA199, CHROMGRNA in the last 8760 hours.  Assessment and Plan:  Hx of Hodgkin's Lymphoma, prostate cancer, HTN, PNA and GERD. He is here today for port-a-cath removal d/t recent completion of chemotherapy as he is in complete remission from  Hodgkin's Lymphoma.   Risks and benefits of port-a-catheter removal was discussed with the patient including, but not limited to bleeding, infection. All of the patient's  questions were answered, patient is agreeable to proceed. Consent signed and in chart.   No labs needed No thinners VSS Pt last ate at 0730 this morning   Thank you for this interesting consult.  I greatly enjoyed meeting [name] and look forward to participating in their care.  A copy of this report was sent to the requesting provider on this date.  Electronically Signed: Tyson Alias, NP 07/22/2021, 2:25 PM   I spent a total of 30 minutes in face to face in clinical consultation, greater than 50% of which was counseling/coordinating care for port-a-cath removal.

## 2021-10-01 ENCOUNTER — Inpatient Hospital Stay: Payer: BC Managed Care – PPO | Attending: Hematology and Oncology

## 2021-10-01 ENCOUNTER — Encounter (HOSPITAL_COMMUNITY): Payer: Self-pay

## 2021-10-01 ENCOUNTER — Other Ambulatory Visit: Payer: Self-pay

## 2021-10-01 ENCOUNTER — Ambulatory Visit (HOSPITAL_COMMUNITY)
Admission: RE | Admit: 2021-10-01 | Discharge: 2021-10-01 | Disposition: A | Discharge status: Home / Self Care | Payer: BC Managed Care – PPO | Source: Ambulatory Visit | Attending: Hematology and Oncology | Admitting: Hematology and Oncology

## 2021-10-01 DIAGNOSIS — I2510 Atherosclerotic heart disease of native coronary artery without angina pectoris: Secondary | ICD-10-CM | POA: Diagnosis not present

## 2021-10-01 DIAGNOSIS — C8121 Mixed cellularity classical Hodgkin lymphoma, lymph nodes of head, face, and neck: Secondary | ICD-10-CM

## 2021-10-01 DIAGNOSIS — C8190 Hodgkin lymphoma, unspecified, unspecified site: Secondary | ICD-10-CM | POA: Diagnosis not present

## 2021-10-01 DIAGNOSIS — K4090 Unilateral inguinal hernia, without obstruction or gangrene, not specified as recurrent: Secondary | ICD-10-CM | POA: Diagnosis not present

## 2021-10-01 DIAGNOSIS — I700 Atherosclerosis of aorta: Secondary | ICD-10-CM | POA: Diagnosis not present

## 2021-10-01 DIAGNOSIS — Z8546 Personal history of malignant neoplasm of prostate: Secondary | ICD-10-CM | POA: Diagnosis not present

## 2021-10-01 LAB — CBC WITH DIFFERENTIAL (CANCER CENTER ONLY)
Abs Immature Granulocytes: 0.02 10*3/uL (ref 0.00–0.07)
Basophils Absolute: 0.1 10*3/uL (ref 0.0–0.1)
Basophils Relative: 1 %
Eosinophils Absolute: 0.3 10*3/uL (ref 0.0–0.5)
Eosinophils Relative: 5 %
HCT: 42.6 % (ref 39.0–52.0)
Hemoglobin: 14.6 g/dL (ref 13.0–17.0)
Immature Granulocytes: 0 %
Lymphocytes Relative: 20 %
Lymphs Abs: 1.4 10*3/uL (ref 0.7–4.0)
MCH: 28.7 pg (ref 26.0–34.0)
MCHC: 34.3 g/dL (ref 30.0–36.0)
MCV: 83.7 fL (ref 80.0–100.0)
Monocytes Absolute: 0.6 10*3/uL (ref 0.1–1.0)
Monocytes Relative: 9 %
Neutro Abs: 4.5 10*3/uL (ref 1.7–7.7)
Neutrophils Relative %: 65 %
Platelet Count: 158 10*3/uL (ref 150–400)
RBC: 5.09 MIL/uL (ref 4.22–5.81)
RDW: 14 % (ref 11.5–15.5)
WBC Count: 6.9 10*3/uL (ref 4.0–10.5)
nRBC: 0 % (ref 0.0–0.2)

## 2021-10-01 LAB — LACTATE DEHYDROGENASE: LDH: 157 U/L (ref 98–192)

## 2021-10-01 LAB — CMP (CANCER CENTER ONLY)
ALT: 36 U/L (ref 0–44)
AST: 31 U/L (ref 15–41)
Albumin: 4.2 g/dL (ref 3.5–5.0)
Alkaline Phosphatase: 59 U/L (ref 38–126)
Anion gap: 11 (ref 5–15)
BUN: 10 mg/dL (ref 6–20)
CO2: 23 mmol/L (ref 22–32)
Calcium: 9.4 mg/dL (ref 8.9–10.3)
Chloride: 106 mmol/L (ref 98–111)
Creatinine: 0.85 mg/dL (ref 0.61–1.24)
GFR, Estimated: >60 mL/min (ref >60)
Glucose, Bld: 127 mg/dL — ABNORMAL HIGH (ref 70–99)
Potassium: 3.6 mmol/L (ref 3.5–5.1)
Sodium: 140 mmol/L (ref 135–145)
Total Bilirubin: 0.6 mg/dL (ref 0.3–1.2)
Total Protein: 7.5 g/dL (ref 6.5–8.1)

## 2021-10-01 MED ORDER — IOHEXOL 350 MG/ML SOLN
80.0000 mL | Freq: Once | INTRAVENOUS | Status: AC | PRN
  Administered 2021-10-01: 75 mL via INTRAVENOUS

## 2021-10-02 DIAGNOSIS — S2231XA Fracture of one rib, right side, initial encounter for closed fracture: Secondary | ICD-10-CM | POA: Diagnosis not present

## 2021-10-02 DIAGNOSIS — S299XXA Unspecified injury of thorax, initial encounter: Secondary | ICD-10-CM | POA: Diagnosis not present

## 2021-10-02 DIAGNOSIS — Z28310 Unvaccinated for covid-19: Secondary | ICD-10-CM | POA: Diagnosis not present

## 2021-10-02 DIAGNOSIS — Y92410 Unspecified street and highway as the place of occurrence of the external cause: Secondary | ICD-10-CM | POA: Diagnosis not present

## 2021-10-02 DIAGNOSIS — R2991 Unspecified symptoms and signs involving the musculoskeletal system: Secondary | ICD-10-CM | POA: Diagnosis not present

## 2021-10-07 ENCOUNTER — Other Ambulatory Visit: Payer: BC Managed Care – PPO

## 2021-10-07 ENCOUNTER — Ambulatory Visit: Payer: BC Managed Care – PPO | Admitting: Hematology and Oncology

## 2021-10-08 ENCOUNTER — Other Ambulatory Visit: Payer: BC Managed Care – PPO

## 2021-10-08 ENCOUNTER — Other Ambulatory Visit: Payer: Self-pay

## 2021-10-08 ENCOUNTER — Other Ambulatory Visit: Payer: Self-pay | Admitting: Hematology and Oncology

## 2021-10-08 ENCOUNTER — Inpatient Hospital Stay: Payer: BC Managed Care – PPO | Attending: Hematology and Oncology | Admitting: Hematology and Oncology

## 2021-10-08 VITALS — BP 154/74 | HR 70 | Temp 97.9°F | Resp 18 | Wt 254.2 lb

## 2021-10-08 DIAGNOSIS — Z8249 Family history of ischemic heart disease and other diseases of the circulatory system: Secondary | ICD-10-CM | POA: Insufficient documentation

## 2021-10-08 DIAGNOSIS — I2510 Atherosclerotic heart disease of native coronary artery without angina pectoris: Secondary | ICD-10-CM | POA: Insufficient documentation

## 2021-10-08 DIAGNOSIS — Z8546 Personal history of malignant neoplasm of prostate: Secondary | ICD-10-CM | POA: Diagnosis not present

## 2021-10-08 DIAGNOSIS — Z95828 Presence of other vascular implants and grafts: Secondary | ICD-10-CM

## 2021-10-08 DIAGNOSIS — G4700 Insomnia, unspecified: Secondary | ICD-10-CM | POA: Diagnosis not present

## 2021-10-08 DIAGNOSIS — I10 Essential (primary) hypertension: Secondary | ICD-10-CM | POA: Diagnosis not present

## 2021-10-08 DIAGNOSIS — Z823 Family history of stroke: Secondary | ICD-10-CM | POA: Insufficient documentation

## 2021-10-08 DIAGNOSIS — Z79899 Other long term (current) drug therapy: Secondary | ICD-10-CM | POA: Diagnosis not present

## 2021-10-08 DIAGNOSIS — I700 Atherosclerosis of aorta: Secondary | ICD-10-CM | POA: Insufficient documentation

## 2021-10-08 DIAGNOSIS — K219 Gastro-esophageal reflux disease without esophagitis: Secondary | ICD-10-CM | POA: Insufficient documentation

## 2021-10-08 DIAGNOSIS — C8121 Mixed cellularity classical Hodgkin lymphoma, lymph nodes of head, face, and neck: Secondary | ICD-10-CM

## 2021-10-08 DIAGNOSIS — K4090 Unilateral inguinal hernia, without obstruction or gangrene, not specified as recurrent: Secondary | ICD-10-CM | POA: Insufficient documentation

## 2021-10-08 DIAGNOSIS — R5081 Fever presenting with conditions classified elsewhere: Secondary | ICD-10-CM | POA: Diagnosis not present
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
## 2021-10-12 DIAGNOSIS — G4733 Obstructive sleep apnea (adult) (pediatric): Secondary | ICD-10-CM | POA: Diagnosis not present

## 2021-10-26 DIAGNOSIS — R351 Nocturia: Secondary | ICD-10-CM | POA: Diagnosis not present

## 2021-10-26 DIAGNOSIS — N401 Enlarged prostate with lower urinary tract symptoms: Secondary | ICD-10-CM | POA: Diagnosis not present

## 2021-10-26 DIAGNOSIS — C61 Malignant neoplasm of prostate: Secondary | ICD-10-CM | POA: Diagnosis not present

## 2021-11-11 DIAGNOSIS — G4733 Obstructive sleep apnea (adult) (pediatric): Secondary | ICD-10-CM | POA: Diagnosis not present

## 2021-12-12 DIAGNOSIS — G4733 Obstructive sleep apnea (adult) (pediatric): Secondary | ICD-10-CM | POA: Diagnosis not present

## 2022-01-07 ENCOUNTER — Inpatient Hospital Stay: Payer: BC Managed Care – PPO

## 2022-01-07 ENCOUNTER — Inpatient Hospital Stay: Payer: BC Managed Care – PPO | Admitting: Hematology and Oncology

## 2022-01-10 ENCOUNTER — Inpatient Hospital Stay: Payer: BC Managed Care – PPO | Attending: Hematology and Oncology

## 2022-01-10 ENCOUNTER — Other Ambulatory Visit: Payer: Self-pay

## 2022-01-10 ENCOUNTER — Inpatient Hospital Stay: Payer: BC Managed Care – PPO | Admitting: Hematology and Oncology

## 2022-01-10 VITALS — BP 137/85 | HR 86 | Temp 97.8°F | Resp 18 | Ht 70.0 in | Wt 264.3 lb

## 2022-01-10 DIAGNOSIS — Z79899 Other long term (current) drug therapy: Secondary | ICD-10-CM | POA: Insufficient documentation

## 2022-01-10 DIAGNOSIS — Z9221 Personal history of antineoplastic chemotherapy: Secondary | ICD-10-CM | POA: Insufficient documentation

## 2022-01-10 DIAGNOSIS — Z8546 Personal history of malignant neoplasm of prostate: Secondary | ICD-10-CM | POA: Insufficient documentation

## 2022-01-10 DIAGNOSIS — C8121 Mixed cellularity classical Hodgkin lymphoma, lymph nodes of head, face, and neck: Secondary | ICD-10-CM

## 2022-01-10 DIAGNOSIS — Z87891 Personal history of nicotine dependence: Secondary | ICD-10-CM | POA: Diagnosis not present

## 2022-01-10 DIAGNOSIS — Z7984 Long term (current) use of oral hypoglycemic drugs: Secondary | ICD-10-CM | POA: Insufficient documentation

## 2022-01-10 LAB — CMP (CANCER CENTER ONLY)
ALT: 44 U/L (ref 0–44)
AST: 35 U/L (ref 15–41)
Albumin: 4.3 g/dL (ref 3.5–5.0)
Alkaline Phosphatase: 60 U/L (ref 38–126)
Anion gap: 10 (ref 5–15)
BUN: 8 mg/dL (ref 8–23)
CO2: 23 mmol/L (ref 22–32)
Calcium: 9.4 mg/dL (ref 8.9–10.3)
Chloride: 106 mmol/L (ref 98–111)
Creatinine: 0.9 mg/dL (ref 0.61–1.24)
GFR, Estimated: >60 mL/min (ref >60)
Glucose, Bld: 128 mg/dL — ABNORMAL HIGH (ref 70–99)
Potassium: 3.4 mmol/L — ABNORMAL LOW (ref 3.5–5.1)
Sodium: 139 mmol/L (ref 135–145)
Total Bilirubin: 0.5 mg/dL (ref 0.3–1.2)
Total Protein: 7.3 g/dL (ref 6.5–8.1)

## 2022-01-10 LAB — CBC WITH DIFFERENTIAL (CANCER CENTER ONLY)
Abs Immature Granulocytes: 0.01 10*3/uL (ref 0.00–0.07)
Basophils Absolute: 0 10*3/uL (ref 0.0–0.1)
Basophils Relative: 1 %
Eosinophils Absolute: 0.2 10*3/uL (ref 0.0–0.5)
Eosinophils Relative: 4 %
HCT: 42.6 % (ref 39.0–52.0)
Hemoglobin: 14.4 g/dL (ref 13.0–17.0)
Immature Granulocytes: 0 %
Lymphocytes Relative: 25 %
Lymphs Abs: 1.5 10*3/uL (ref 0.7–4.0)
MCH: 29.3 pg (ref 26.0–34.0)
MCHC: 33.8 g/dL (ref 30.0–36.0)
MCV: 86.6 fL (ref 80.0–100.0)
Monocytes Absolute: 0.5 10*3/uL (ref 0.1–1.0)
Monocytes Relative: 9 %
Neutro Abs: 3.7 10*3/uL (ref 1.7–7.7)
Neutrophils Relative %: 61 %
Platelet Count: 171 10*3/uL (ref 150–400)
RBC: 4.92 MIL/uL (ref 4.22–5.81)
RDW: 13.5 % (ref 11.5–15.5)
WBC Count: 6.1 10*3/uL (ref 4.0–10.5)
nRBC: 0 % (ref 0.0–0.2)

## 2022-01-10 LAB — LACTATE DEHYDROGENASE: LDH: 132 U/L (ref 98–192)

## 2022-01-10 NOTE — Progress Notes (Signed)
[place] [phone]   [phone]  PROGRESS NOTE  Patient Care Team: London Pepper, MD as PCP - General (Family Medicine)  Hematological/Oncological History # Classical Hodgkin's Lymphoma, Mixed Cellularity. 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 2) 10/27/2020: resection of neck mass. Pathology showed classical Hodgkin's lymphoma, mixed cellularity subtype 3) 11/13/2020: establish care with Dr. Lorenso Courier  4) 11/26/2020: PET CT scan shows single hypermetabolic unenlarged right level II cervical lymph node identified on today's study. FDG uptake is compatible with Deauville 4. Other small bilateral cervical and upper normal to borderline hepatoduodenal ligament lymph nodes show Deauville 2 uptake levels 5) 12/18/2020: Cycle 1 Day 1 of AVD chemotherapy  6) 01/15/2021: Cycle 2 Day 1 of AVD chemotherapy  7) 02/12/2021: PET CT scan shows no evidence of residual disease 8) 02/15/2021: Cycle 3 Day 1 of AVD chemotherapy  9) 03/01/2021: Cycle 3 Day 15 of AVD chemotherapy  10) 03/15/2021: Cycle 4 Day 1 of AVD chemotherapy  11) 03/29/2021: Cycle 4 Day 15 of AVD chemotherapy 12) 04/12/2021: Cycle 5 Day 1 of AVD chemotherapy 13) 04/26/2021: Cycle 5 Day 15 of AVD chemotherapy 14) 05/10/2021: Cycle 6 Day 1 of AVD chemotherapy 15) 05/24/2021: Cycle 6 Day 15 of AVD chemotherapy 16) 06/21/2021: PET CT scan shows no evidence of residual/recurrent lymphoma. Patient in complete remission, enter surviellance.   Interval History:  [name] 61 y.o. male with medical history significant for Classical Hodgkin's lymphoma Early Stage/Unfavorable risk who presents for a follow up visit. The patient's last visit was on 10/08/2021. In the interim since the last visit he has had no major changes in his health.  On exam today [name] reports he is recovering well from his rib fracture  and now he is back to work.  He notes he does get a "little twinge of pain" every now and again.  He notes overall he feels good.  He feels like his eyes were "messed up by the chemotherapy".  Though his hair is currently coming back.  He notes his energy is good his weight is increasing and his appetite has been excellent.  He notes he has not been having any issues with bumps or lumps at any of his lymph node sites.  He notes overall he feels like he is at baseline.  He currently denies any fevers, chills, night sweats, changes in shortness of breath, chest pain or cough.  He has no other complaints. A full 10 point ROS is listed below.  MEDICAL HISTORY:  Past Medical History:  Diagnosis Date   Cancer ([place]) 09/2014   recent dx prostate ca-no tx yet   GERD (gastroesophageal reflux disease)    Hodgkin's lymphoma ([place]) 11/2020   Hypertension    Pneumonia    history   Teeth decayed     SURGICAL HISTORY: Past Surgical History:  Procedure Laterality Date   IR IMAGING GUIDED PORT INSERTION  12/11/2020   IR REMOVAL TUN ACCESS W/ PORT W/O FL MOD SED  07/22/2021   MASS BIOPSY Left 09/29/2014   Procedure: LEFT LOWER NECK MASS EXCISION;  Surgeon: Izora Gala, MD;  Location: [facility];  Service: ENT;  Laterality: Left;   PAROTIDECTOMY Left 09/29/2014   Procedure: LEFT PAROTIDECTOMY;  Surgeon: Izora Gala, MD;  Location: [facility];  Service: ENT;  Laterality: Left;   SEPTOPLASTY  1976    SOCIAL HISTORY: Social  History   Socioeconomic History   Marital status: Widowed    Spouse name: Not on file   Number of children: Not on file   Years of education: Not on file   Highest education level: Not on file  Occupational History   Not on file  Tobacco Use   Smoking status: Former    Packs/day: 3.00    Years: 32.00    Pack years: 96.00    Types: Cigarettes    Quit date: 11/26/2017    Years since quitting: 4.1   Smokeless tobacco: Never  Vaping Use   Vaping Use:  Never used  Substance and Sexual Activity   Alcohol use: Yes    Comment: twice a year   Drug use: No   Sexual activity: Not on file  Other Topics Concern   Not on file  Social History Narrative   Not on file   Social Determinants of Health   Financial Resource Strain: Not on file  Food Insecurity: Not on file  Transportation Needs: Not on file  Physical Activity: Not on file  Stress: Not on file  Social Connections: Not on file  Intimate Partner Violence: Not on file    FAMILY HISTORY: Family History  Problem Relation Age of Onset   Stroke Mother    Heart disease Father     ALLERGIES:  has No Known Allergies.  MEDICATIONS:  Current Outpatient Medications  Medication Sig Dispense Refill   acetaminophen (TYLENOL) 500 MG tablet Take 500 mg by mouth every 6 (six) hours as needed for headache.     amLODipine (NORVASC) 5 MG tablet Take 5 mg by mouth every evening.     B Complex-Folic Acid (B COMPLEX FORMULA 1, W/ FA,) TABS Take 1 tablet by mouth daily. 30 tablet 3   losartan (COZAAR) 100 MG tablet Take 100 mg by mouth every evening.     metFORMIN (GLUCOPHAGE) 500 MG tablet Take 1 tablet by mouth 2 (two) times daily.     pantoprazole (PROTONIX) 40 MG tablet Take 1 tablet by mouth every evening.     rosuvastatin (CRESTOR) 5 MG tablet Take 5 mg by mouth every evening.     No current facility-administered medications for this visit.   Facility-Administered Medications Ordered in Other Visits  Medication Dose Route Frequency Provider Last Rate Last Admin   0.9 %  sodium chloride infusion   Intravenous Continuous Allred, Darrell K, PA-C        REVIEW OF SYSTEMS:   Constitutional: ( - ) fevers, ( - )  chills , ( - ) night sweats Eyes: ( - ) blurriness of vision, ( - ) double vision, ( - ) watery eyes Ears, nose, mouth, throat, and face: ( - ) mucositis, ( - ) sore throat Respiratory: ( - ) cough, ( - ) dyspnea, ( - ) wheezes Cardiovascular: ( - ) palpitation, ( - ) chest  discomfort, ( - ) lower extremity swelling Gastrointestinal:  ( - ) nausea, ( - ) heartburn, ( - ) change in bowel habits Skin: ( - ) abnormal skin rashes Lymphatics: ( - ) new lymphadenopathy, ( - ) easy bruising Neurological: ( - ) numbness, ( - ) tingling, ( - ) new weaknesses Behavioral/Psych: ( - ) mood change, ( - ) new changes  All other systems were reviewed with the patient and are negative.  PHYSICAL EXAMINATION: ECOG PERFORMANCE STATUS: 1 - Symptomatic but completely ambulatory  Vitals:   01/10/22 1005  BP: 137/85  Pulse: 86  Resp: 18  Temp: 97.8 F (36.6 C)  SpO2: 97%    Filed Weights   01/10/22 1005  Weight: 264 lb 4.8 oz (119.9 kg)     GENERAL: well appearing middle aged Caucasian male in NAD SKIN: skin color, texture, turgor are normal, no rashes or significant lesions EYES: conjunctiva are pink and non-injected, sclera clear LUNGS: clear to auscultation and percussion with normal breathing effort HEART: regular rate & rhythm and no murmurs and no lower extremity edema Musculoskeletal: no cyanosis of digits and no clubbing  PSYCH: alert & oriented x 3, fluent speech NEURO: no focal motor/sensory deficits  LABORATORY DATA:  I have reviewed the data as listed CBC Latest Ref Rng & Units 01/10/2022 10/01/2021 07/05/2021  WBC 4.0 - 10.5 K/uL 6.1 6.9 5.6  Hemoglobin 13.0 - 17.0 g/dL 14.4 14.6 12.4(L)  Hematocrit 39.0 - 52.0 % 42.6 42.6 37.1(L)  Platelets 150 - 400 K/uL 171 158 144(L)    CMP Latest Ref Rng & Units 01/10/2022 10/01/2021 07/05/2021  Glucose 70 - 99 mg/dL 128(H) 127(H) 171(H)  BUN 8 - 23 mg/dL _0 Creatinine 0.61 - 1.24 mg/dL 0.90 0.85 0.85  Sodium 135 - 145 mmol/L 139 140 141  Potassium 3.5 - 5.1 mmol/L 3.4(L) 3.6 3.6  Chloride 98 - 111 mmol/L 106 106 108  CO2 22 - 32 mmol/L _1 Calcium 8.9 - 10.3 mg/dL 9.4 9.4 9.4  Total Protein 6.5 - 8.1 g/dL 7.3 7.5 6.9  Total Bilirubin 0.3 - 1.2 mg/dL 0.5 0.6 0.3  Alkaline Phos 38 - 126 U/L 60 59 60   AST 15 - 41 U/L 35 31 34  ALT 0 - 44 U/L 44 36 43    No results found for: MPROTEIN No results found for: KPAFRELGTCHN, LAMBDASER, KAPLAMBRATIO   RADIOGRAPHIC STUDIES:  No results found.  ASSESSMENT & PLAN [name] 61 y.o. male with medical history significant for Classical Hodgkin's lymphoma Early Stage/Unfavorable risk who presents for a follow up visit.  After review the labs, the records, review of the PFTs, and review the echocardiogram the findings are most consistent with an early stage/unfavorable risk classical Hodgkin's lymphoma.  Given that he meets unfavorable criteria via multiple guidelines I recommended that we proceed with treatment as recommended in the NCCN guidelines for patients with early stage unfavorable disease (which is the exact same treatment recommended for stage III-IV disease).  Previously we discussed the AVD chemotherapy regimen.  We discussed the expected side effects including possible hair loss, nausea, vomiting, diarrhea, constipation, cytopenias, neutropenia, cardiac dysfunction, and neurological damage.  The patient will be treated with every 2 week rounds of chemotherapy using this regimen. AVD chemotherapy consists of doxorubicin 25 mg/m on days 1 and 15, vinblastine 6 mg per metered squared IV on days 1 and 15, and dacarbazine 375 mg/m IV on days 1 and 15.  After 2 cycles of AVD chemotherapy, PET/CT scan from 02/12/2021 revealed complete response to therapy with no evidence of lymphoma. The recommendation is to proceed with a further 4 cycles of AVD therapy.   # Classical Hodgkin's Lymphoma, Mixed Cellularity. 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  --findings were consistent with an Early stage unfavorable risk disease (due to age, ESR elevation, and mixed cellularity based on EORTC, GHSG, and ECGO criteria) --his disease appears early stage with one clear site of involvement in the neck. There are some smaller lymph nodes in the neck and one  near the hepatoduodenal ligament,  though would favor diagnosis of early stage --based on these criteria the recommendation would be for AVD x 2 cycles with interval PET CT scan followed by AVD x 4 cycles.  --interval PET on 06/21/2021 showed complete response to therapy --we avoided bleomycin in this patient due to smoking history, mild abnormalities on PFTs and age Plan: -- Labs (CBC, CMP, LDH) from today were reviewed without any intervention needed.  -- Labs show white blood cell count 6.1, hemoglobin 14.4, MCV 86.6, and platelets of 171. --CT scan q 6 months next due at next visit.  --RTC in 3 months with f/u CT scan    #Supportive Care --chemotherapy education complete --port to be removed on 07/22/2021.   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 No biopsy recommended.  --continue to monitor on future scans.  Orders Placed This Encounter  Procedures   CT CHEST ABDOMEN PELVIS W CONTRAST    Standing Status:   Future    Standing Expiration Date:   01/10/2023    Order Specific Question:   Preferred imaging location?    Answer:   [facility]    Order Specific Question:   Is Oral Contrast requested for this exam?    Answer:   Yes, Per Radiology protocol   All questions were answered. The patient knows to call the clinic with any problems, questions or concerns.  I have spent a total of 30 minutes minutes of face-to-face and non-face-to-face time, preparing to see the patient, obtaining and/or reviewing separately obtained history, performing a medically appropriate examination, counseling and educating the patient, ordering tests,documenting clinical information in the electronic health record and care coordination.   Ledell Peoples, MD Department of Hematology/Oncology [place] [facility] Phone: [phone] Pager: [phone] Email: [email or web address]  01/11/2022 10:01 AM

## 2022-01-11 ENCOUNTER — Encounter: Payer: Self-pay | Admitting: Hematology and Oncology

## 2022-01-12 ENCOUNTER — Telehealth: Payer: Self-pay | Admitting: Hematology and Oncology

## 2022-01-12 NOTE — Telephone Encounter (Signed)
Scheduled per 2/6 los, pt has been called and confirmed appt

## 2022-01-21 DIAGNOSIS — C61 Malignant neoplasm of prostate: Secondary | ICD-10-CM | POA: Diagnosis not present

## 2022-01-29 ENCOUNTER — Other Ambulatory Visit: Payer: Self-pay | Admitting: Urology

## 2022-01-29 DIAGNOSIS — C61 Malignant neoplasm of prostate: Secondary | ICD-10-CM

## 2022-02-18 ENCOUNTER — Other Ambulatory Visit: Payer: Self-pay

## 2022-02-18 ENCOUNTER — Ambulatory Visit
Admission: RE | Admit: 2022-02-18 | Discharge: 2022-02-18 | Disposition: A | Discharge status: Home / Self Care | Payer: BC Managed Care – PPO | Source: Ambulatory Visit | Attending: Urology | Admitting: Urology

## 2022-02-18 DIAGNOSIS — C61 Malignant neoplasm of prostate: Secondary | ICD-10-CM

## 2022-02-18 MED ORDER — GADOBENATE DIMEGLUMINE 529 MG/ML IV SOLN
20.0000 mL | Freq: Once | INTRAVENOUS | Status: AC | PRN
  Administered 2022-02-18: 20 mL via INTRAVENOUS

## 2022-02-18 MED ORDER — GADOBENATE DIMEGLUMINE 529 MG/ML IV SOLN: 20.0000 mL | Freq: Once | INTRAVENOUS | Status: DC | PRN

## 2022-03-21 ENCOUNTER — Telehealth: Payer: Self-pay | Admitting: Hematology and Oncology

## 2022-03-21 NOTE — Telephone Encounter (Signed)
Per provider called pt to reschedule. .  Left a message with appointment details.  Call back was left if changes are needed  ?

## 2022-03-25 DIAGNOSIS — U071 COVID-19: Secondary | ICD-10-CM | POA: Diagnosis not present

## 2022-03-25 DIAGNOSIS — R059 Cough, unspecified: Secondary | ICD-10-CM | POA: Diagnosis not present

## 2022-04-05 DIAGNOSIS — C61 Malignant neoplasm of prostate: Secondary | ICD-10-CM | POA: Diagnosis not present

## 2022-04-06 ENCOUNTER — Other Ambulatory Visit: Payer: Self-pay

## 2022-04-06 ENCOUNTER — Other Ambulatory Visit: Payer: Self-pay | Admitting: Hematology and Oncology

## 2022-04-06 ENCOUNTER — Encounter: Payer: Self-pay | Admitting: Hematology and Oncology

## 2022-04-06 ENCOUNTER — Inpatient Hospital Stay: Payer: BC Managed Care – PPO | Attending: Hematology and Oncology

## 2022-04-06 ENCOUNTER — Inpatient Hospital Stay: Payer: BC Managed Care – PPO | Admitting: Hematology and Oncology

## 2022-04-06 VITALS — BP 150/76 | HR 84 | Temp 98.6°F | Resp 16 | Wt 266.8 lb

## 2022-04-06 DIAGNOSIS — C8121 Mixed cellularity classical Hodgkin lymphoma, lymph nodes of head, face, and neck: Secondary | ICD-10-CM

## 2022-04-06 DIAGNOSIS — Z79899 Other long term (current) drug therapy: Secondary | ICD-10-CM | POA: Diagnosis not present

## 2022-04-06 DIAGNOSIS — Z8546 Personal history of malignant neoplasm of prostate: Secondary | ICD-10-CM | POA: Diagnosis not present

## 2022-04-06 DIAGNOSIS — Z95828 Presence of other vascular implants and grafts: Secondary | ICD-10-CM

## 2022-04-06 LAB — LACTATE DEHYDROGENASE: LDH: 138 U/L (ref 98–192)

## 2022-04-06 LAB — CBC WITH DIFFERENTIAL (CANCER CENTER ONLY)
Abs Immature Granulocytes: 0.04 10*3/uL (ref 0.00–0.07)
Basophils Absolute: 0 10*3/uL (ref 0.0–0.1)
Basophils Relative: 1 %
Eosinophils Absolute: 0.2 10*3/uL (ref 0.0–0.5)
Eosinophils Relative: 3 %
HCT: 43.5 % (ref 39.0–52.0)
Hemoglobin: 14.8 g/dL (ref 13.0–17.0)
Immature Granulocytes: 1 %
Lymphocytes Relative: 22 %
Lymphs Abs: 1.6 10*3/uL (ref 0.7–4.0)
MCH: 29.3 pg (ref 26.0–34.0)
MCHC: 34 g/dL (ref 30.0–36.0)
MCV: 86.1 fL (ref 80.0–100.0)
Monocytes Absolute: 0.8 10*3/uL (ref 0.1–1.0)
Monocytes Relative: 12 %
Neutro Abs: 4.6 10*3/uL (ref 1.7–7.7)
Neutrophils Relative %: 61 %
Platelet Count: 200 10*3/uL (ref 150–400)
RBC: 5.05 MIL/uL (ref 4.22–5.81)
RDW: 13.5 % (ref 11.5–15.5)
WBC Count: 7.3 10*3/uL (ref 4.0–10.5)
nRBC: 0 % (ref 0.0–0.2)

## 2022-04-06 LAB — CMP (CANCER CENTER ONLY)
ALT: 40 U/L (ref 0–44)
AST: 39 U/L (ref 15–41)
Albumin: 4.2 g/dL (ref 3.5–5.0)
Alkaline Phosphatase: 58 U/L (ref 38–126)
Anion gap: 9 (ref 5–15)
BUN: 12 mg/dL (ref 8–23)
CO2: 25 mmol/L (ref 22–32)
Calcium: 9.2 mg/dL (ref 8.9–10.3)
Chloride: 105 mmol/L (ref 98–111)
Creatinine: 0.89 mg/dL (ref 0.61–1.24)
GFR, Estimated: >60 mL/min (ref >60)
Glucose, Bld: 154 mg/dL — ABNORMAL HIGH (ref 70–99)
Potassium: 3.9 mmol/L (ref 3.5–5.1)
Sodium: 139 mmol/L (ref 135–145)
Total Bilirubin: 0.4 mg/dL (ref 0.3–1.2)
Total Protein: 7.1 g/dL (ref 6.5–8.1)

## 2022-04-06 NOTE — Progress Notes (Signed)
?[place] ?[phone]   [phone] ? ?PROGRESS NOTE ? ?Patient Care Team: ?London Pepper, MD as PCP - General (Family Medicine) ? ?Hematological/Oncological History ?# Classical Hodgkin's Lymphoma, Mixed Cellularity. 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 ?2) 10/27/2020: resection of neck mass. Pathology showed classical Hodgkin's lymphoma, mixed cellularity subtype ?3) 11/13/2020: establish care with Dr. Lorenso Courier  ?4) 11/26/2020: PET CT scan shows single hypermetabolic unenlarged right level II cervical lymph node identified on today's study. FDG uptake is compatible with Deauville 4. Other small bilateral cervical and upper normal to borderline hepatoduodenal ligament lymph nodes show Deauville 2 uptake levels ?5) 12/18/2020: Cycle 1 Day 1 of AVD chemotherapy  ?6) 01/15/2021: Cycle 2 Day 1 of AVD chemotherapy  ?7) 02/12/2021: PET CT scan shows no evidence of residual disease ?8) 02/15/2021: Cycle 3 Day 1 of AVD chemotherapy  ?9) 03/01/2021: Cycle 3 Day 15 of AVD chemotherapy  ?10) 03/15/2021: Cycle 4 Day 1 of AVD chemotherapy  ?11) 03/29/2021: Cycle 4 Day 15 of AVD chemotherapy ?12) 04/12/2021: Cycle 5 Day 1 of AVD chemotherapy ?13) 04/26/2021: Cycle 5 Day 15 of AVD chemotherapy ?14) 05/10/2021: Cycle 6 Day 1 of AVD chemotherapy ?15) 05/24/2021: Cycle 6 Day 15 of AVD chemotherapy ?16) 06/21/2021: PET CT scan shows no evidence of residual/recurrent lymphoma. Patient in complete remission, enter surviellance.  ? ?Interval History:  ?[name] 61 y.o. male with medical history significant for Classical Hodgkin's lymphoma Early Stage/Unfavorable risk who presents for a follow up visit. The patient's last visit was on 01/10/2022. In the interim since the last visit he has had no major changes in his health. ? ?On exam today [name] reports he has continued to work in the interim since  her last visit.  He reports his energy levels are hit and miss.  He notes some days he feels great and other days he feels wiped out.  Reports his appetite has been good and his weight has continued to increase.  He notes he would like to lose some of this weight.  Otherwise there are no limitations in his day-to-day activity.  He currently denies any fevers, chills, night sweats, changes in shortness of breath, chest pain or cough.  He has no other complaints. A full 10 point ROS is listed below. ? ?MEDICAL HISTORY:  ?Past Medical History:  ?Diagnosis Date  ? Cancer ([place]) 09/2014  ? recent dx prostate ca-no tx yet  ? GERD (gastroesophageal reflux disease)   ? Hodgkin's lymphoma ([place]) 11/2020  ? Hypertension   ? Pneumonia   ? history  ? Teeth decayed   ? ? ?SURGICAL HISTORY: ?Past Surgical History:  ?Procedure Laterality Date  ? IR IMAGING GUIDED PORT INSERTION  12/11/2020  ? IR REMOVAL TUN ACCESS W/ PORT W/O FL MOD SED  07/22/2021  ? MASS BIOPSY Left 09/29/2014  ? Procedure: LEFT LOWER NECK MASS EXCISION;  Surgeon: Izora Gala, MD;  Location: [facility];  Service: ENT;  Laterality: Left;  ? PAROTIDECTOMY Left 09/29/2014  ? Procedure: LEFT PAROTIDECTOMY;  Surgeon: Izora Gala, MD;  Location: [facility];  Service: ENT;  Laterality: Left;  ? SEPTOPLASTY  1976  ? ? ?SOCIAL HISTORY: ?Social History  ? ?Socioeconomic History  ? Marital status: Widowed  ?  Spouse name: Not on file  ? Number of children: Not on file  ? Years of education: Not on file  ?  Highest education level: Not on file  ?Occupational History  ? Not on file  ?Tobacco Use  ? Smoking status: Former  ?  Packs/day: 3.00  ?  Years: 32.00  ?  Pack years: 96.00  ?  Types: Cigarettes  ?  Quit date: 11/26/2017  ?  Years since quitting: 4.3  ? Smokeless tobacco: Never  ?Vaping Use  ? Vaping Use: Never used  ?Substance and Sexual Activity  ? Alcohol use: Yes  ?  Comment: twice a year  ? Drug use: No  ? Sexual activity: Not on file   ?Other Topics Concern  ? Not on file  ?Social History Narrative  ? Not on file  ? ?Social Determinants of Health  ? ?Financial Resource Strain: Not on file  ?Food Insecurity: Not on file  ?Transportation Needs: Not on file  ?Physical Activity: Not on file  ?Stress: Not on file  ?Social Connections: Not on file  ?Intimate Partner Violence: Not on file  ? ? ?FAMILY HISTORY: ?Family History  ?Problem Relation Age of Onset  ? Stroke Mother   ? Heart disease Father   ? ? ?ALLERGIES:  has No Known Allergies. ? ?MEDICATIONS:  ?Current Outpatient Medications  ?Medication Sig Dispense Refill  ? acetaminophen (TYLENOL) 500 MG tablet Take 500 mg by mouth every 6 (six) hours as needed for headache.    ? amLODipine (NORVASC) 5 MG tablet Take 5 mg by mouth every evening.    ? B Complex-Folic Acid (B COMPLEX FORMULA 1, W/ FA,) TABS Take 1 tablet by mouth daily. 30 tablet 3  ? losartan (COZAAR) 100 MG tablet Take 100 mg by mouth every evening.    ? metFORMIN (GLUCOPHAGE) 500 MG tablet Take 1 tablet by mouth 2 (two) times daily.    ? pantoprazole (PROTONIX) 40 MG tablet Take 1 tablet by mouth every evening.    ? rosuvastatin (CRESTOR) 5 MG tablet Take 5 mg by mouth every evening.    ? ?No current facility-administered medications for this visit.  ? ?Facility-Administered Medications Ordered in Other Visits  ?Medication Dose Route Frequency Provider Last Rate Last Admin  ? 0.9 %  sodium chloride infusion   Intravenous Continuous Allred, Darrell K, PA-C      ? ? ?REVIEW OF SYSTEMS:   ?Constitutional: ( - ) fevers, ( - )  chills , ( - ) night sweats ?Eyes: ( - ) blurriness of vision, ( - ) double vision, ( - ) watery eyes ?Ears, nose, mouth, throat, and face: ( - ) mucositis, ( - ) sore throat ?Respiratory: ( - ) cough, ( - ) dyspnea, ( - ) wheezes ?Cardiovascular: ( - ) palpitation, ( - ) chest discomfort, ( - ) lower extremity swelling ?Gastrointestinal:  ( - ) nausea, ( - ) heartburn, ( - ) change in bowel habits ?Skin: ( - )  abnormal skin rashes ?Lymphatics: ( - ) new lymphadenopathy, ( - ) easy bruising ?Neurological: ( - ) numbness, ( - ) tingling, ( - ) new weaknesses ?Behavioral/Psych: ( - ) mood change, ( - ) new changes  ?All other systems were reviewed with the patient and are negative. ? ?PHYSICAL EXAMINATION: ?ECOG PERFORMANCE STATUS: 1 - Symptomatic but completely ambulatory ? ?Vitals:  ? 04/06/22 1506  ?BP: (!) 150/76  ?Pulse: 84  ?Resp: 16  ?Temp: 98.6 ?F (37 ?C)  ?SpO2: 95%  ? ? ?Filed Weights  ? 04/06/22 1506  ?Weight: 266 lb 12.8 oz (121 kg)  ? ? ? ?  GENERAL: well appearing middle aged Caucasian male in NAD ?SKIN: skin color, texture, turgor are normal, no rashes or significant lesions ?EYES: conjunctiva are pink and non-injected, sclera clear ?LUNGS: clear to auscultation and percussion with normal breathing effort ?HEART: regular rate & rhythm and no murmurs and no lower extremity edema ?Musculoskeletal: no cyanosis of digits and no clubbing  ?PSYCH: alert & oriented x 3, fluent speech ?NEURO: no focal motor/sensory deficits ? ?LABORATORY DATA:  ?I have reviewed the data as listed ? ?  Latest Ref Rng & Units 04/06/2022  ?  2:37 PM 01/10/2022  ?  9:47 AM 10/01/2021  ?  8:01 AM  ?CBC  ?WBC 4.0 - 10.5 K/uL 7.3   6.1   6.9    ?Hemoglobin 13.0 - 17.0 g/dL 14.8   14.4   14.6    ?Hematocrit 39.0 - 52.0 % 43.5   42.6   42.6    ?Platelets 150 - 400 K/uL 200   171   158    ? ? ? ?  Latest Ref Rng & Units 04/06/2022  ?  2:37 PM 01/10/2022  ?  9:47 AM 10/01/2021  ?  8:01 AM  ?CMP  ?Glucose 70 - 99 mg/dL 154   128   127    ?BUN 8 - 23 mg/dL '12   8   10    '$ ?Creatinine 0.61 - 1.24 mg/dL 0.89   0.90   0.85    ?Sodium 135 - 145 mmol/L 139   139   140    ?Potassium 3.5 - 5.1 mmol/L 3.9   3.4   3.6    ?Chloride 98 - 111 mmol/L 105   106   106    ?CO2 22 - 32 mmol/L '25   23   23    '$ ?Calcium 8.9 - 10.3 mg/dL 9.2   9.4   9.4    ?Total Protein 6.5 - 8.1 g/dL 7.1   7.3   7.5    ?Total Bilirubin 0.3 - 1.2 mg/dL 0.4   0.5   0.6    ?Alkaline Phos 38 - 126  U/L 58   60   59    ?AST 15 - 41 U/L 39   35   31    ?ALT 0 - 44 U/L 40   44   36    ? ? ?No results found for: MPROTEIN ?No results found for: KPAFRELGTCHN, LAMBDASER, KAPLAMBRATIO ? ? ?RADIOGRAPHIC STUDIES:

## 2022-04-08 ENCOUNTER — Other Ambulatory Visit: Payer: BC Managed Care – PPO

## 2022-04-08 ENCOUNTER — Ambulatory Visit: Payer: BC Managed Care – PPO | Admitting: Hematology and Oncology

## 2022-04-14 ENCOUNTER — Ambulatory Visit (HOSPITAL_COMMUNITY)
Admission: RE | Admit: 2022-04-14 | Discharge: 2022-04-14 | Disposition: A | Discharge status: Home / Self Care | Payer: BC Managed Care – PPO | Source: Ambulatory Visit | Attending: Hematology and Oncology | Admitting: Hematology and Oncology

## 2022-04-14 ENCOUNTER — Encounter (HOSPITAL_COMMUNITY): Payer: Self-pay

## 2022-04-14 ENCOUNTER — Other Ambulatory Visit: Payer: Self-pay | Admitting: Urology

## 2022-04-14 DIAGNOSIS — D1800 Hemangioma unspecified site: Secondary | ICD-10-CM | POA: Diagnosis not present

## 2022-04-14 DIAGNOSIS — M533 Sacrococcygeal disorders, not elsewhere classified: Secondary | ICD-10-CM | POA: Diagnosis not present

## 2022-04-14 DIAGNOSIS — C8121 Mixed cellularity classical Hodgkin lymphoma, lymph nodes of head, face, and neck: Secondary | ICD-10-CM | POA: Diagnosis not present

## 2022-04-14 DIAGNOSIS — K4090 Unilateral inguinal hernia, without obstruction or gangrene, not specified as recurrent: Secondary | ICD-10-CM | POA: Diagnosis not present

## 2022-04-14 DIAGNOSIS — Z8572 Personal history of non-Hodgkin lymphomas: Secondary | ICD-10-CM | POA: Diagnosis not present

## 2022-04-14 DIAGNOSIS — K429 Umbilical hernia without obstruction or gangrene: Secondary | ICD-10-CM | POA: Diagnosis not present

## 2022-04-14 DIAGNOSIS — Z8546 Personal history of malignant neoplasm of prostate: Secondary | ICD-10-CM | POA: Diagnosis not present

## 2022-04-14 HISTORY — DX: Type 2 diabetes mellitus without complications: E11.9

## 2022-04-14 MED ORDER — IOHEXOL 300 MG/ML  SOLN
100.0000 mL | Freq: Once | INTRAMUSCULAR | Status: AC | PRN
  Administered 2022-04-14: 100 mL via INTRAVENOUS

## 2022-04-14 MED ORDER — SODIUM CHLORIDE (PF) 0.9 % IJ SOLN
INTRAMUSCULAR | Status: AC
  Filled 2022-04-14: qty 50

## 2022-04-18 ENCOUNTER — Telehealth: Payer: Self-pay

## 2022-04-18 NOTE — Telephone Encounter (Signed)
Attempted to call pt with results from CT scan. Left VM requesting call back. My chart message sent. ?

## 2022-04-20 ENCOUNTER — Telehealth: Payer: Self-pay | Admitting: Hematology and Oncology

## 2022-04-20 NOTE — Telephone Encounter (Signed)
.  Called pt per 5/17 inbasket , Patient was unavailable, a message with appt time and date was left with number on file.   ?

## 2022-04-23 IMAGING — CT CT NECK W/ CM
3 of 4 series · 6 of 14 positions shown, 7 images · IV contrast (iopamidol)
Comparison: CT neck 08/13/2014

CLINICAL DATA: Enlarging right neck mass.

History of prostate cancer. History of Warthin's tumor left parotid
resected [id number].
EXAM:
CT NECK WITH CONTRAST
TECHNIQUE: Multidetector CT imaging of the neck was performed using the
standard protocol following the bolus administration of intravenous
contrast.
CONTRAST:  75mL [id number] IOPAMIDOL ([id number]) INJECTION 61%

[Series 3: neck · axial · 0.50mm/px · z∈[-260,-162]mm · 2 of 149 slices shown, 3 images]
[im 50/149  soft-tissue]
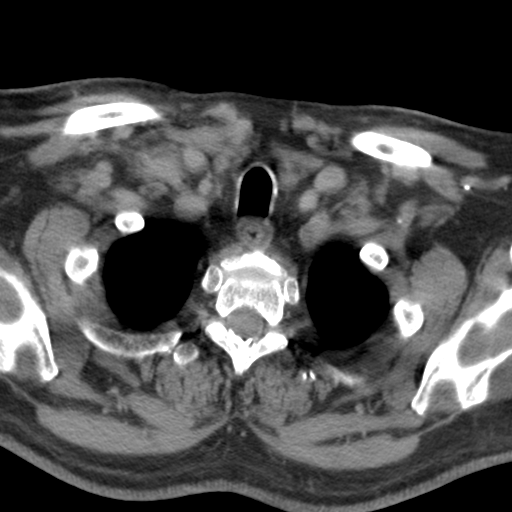
[im 50/149  bone]
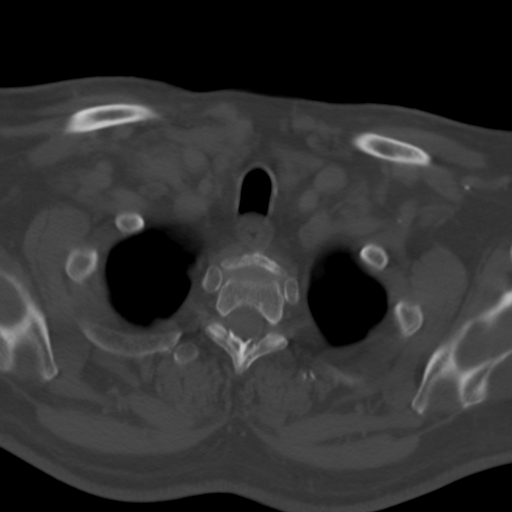
[im 99/149  bone]
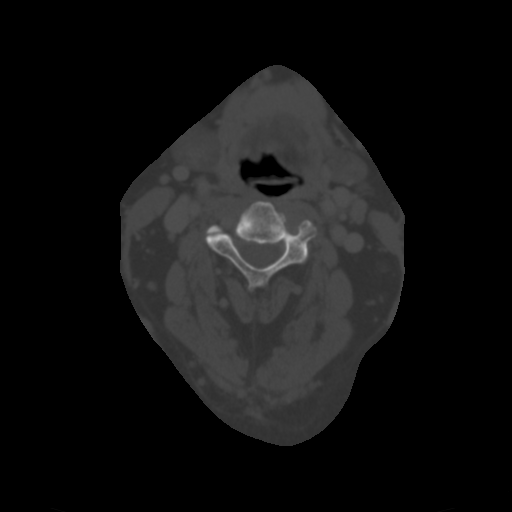

[Series 8: angled axial-oropharynx · axial · 0.50mm/px · z∈[-253,-153]mm · 2 of 150 slices shown]
[im 50/150  bone]
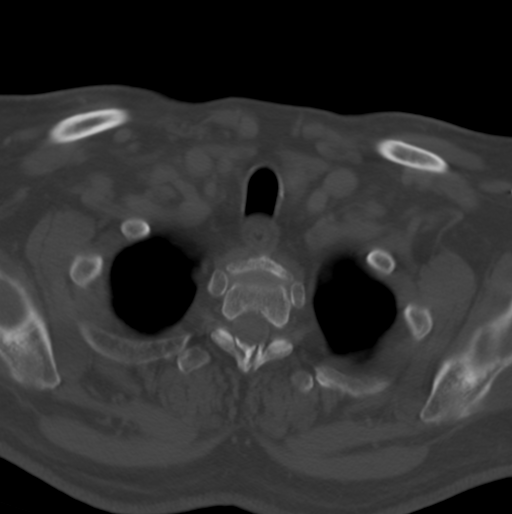
[im 100/150  bone]
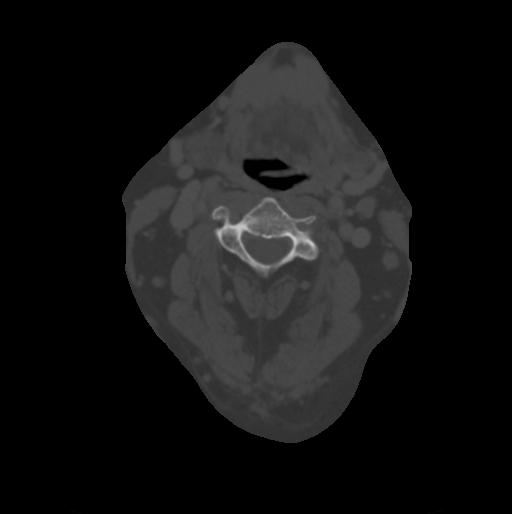

[Series 9: angled (person_name) · axial · 0.50mm/px · z∈[-253,-153]mm · 2 of 150 slices shown]
[im 50/150  bone]
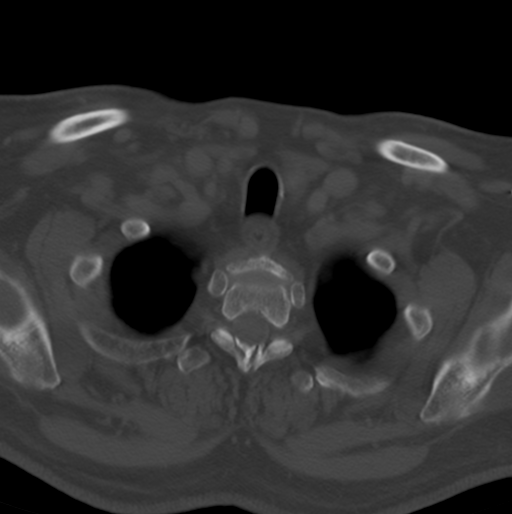
[im 100/150  bone]
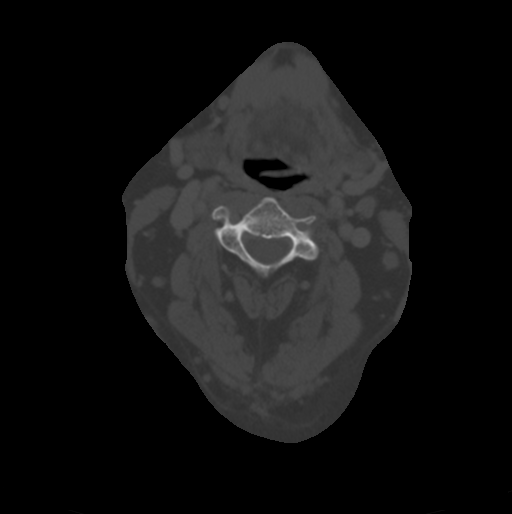

[6 of 14 positions shown; findings below may reference images not displayed]

FINDINGS: Pharynx and larynx: Normal. No mass or swelling.

Salivary glands: Left parotidectomy. No recurrent tumor in the left
parotid bed. Right parotid normal. Submandibular glands normal
bilaterally.

Thyroid: Negative

Lymph nodes: Well-circumscribed mass in the right mid neck posterior
to the sternocleidomastoid muscle and lateral to the jugular vein.
This mass is homogeneous in density and measures 3.5 x 2.8 x 3.7 cm.
Surrounding fat appears normal. No calcification. This was not
present previously.

Right level 2 lymph node 8 mm, smaller compared to the prior study.
Right posterior lymph node 8 mm similar to the prior CT.

Left level 2 lymph node 8 mm, smaller compared to the prior study.
Left posterior lymph node 9 mm similar to the prior study.

Vascular: Normal vascular enhancement. Mild atherosclerotic
calcification of the carotid bifurcation bilaterally.

Limited intracranial: Negative

Visualized orbits: Not imaged

Mastoids and visualized paranasal sinuses: Visualized paranasal
sinuses clear. Mastoid clear bilaterally.

Skeleton: Cervical spondylosis.  No acute skeletal abnormality.

Upper chest: Lung apices clear bilaterally.

Other: Previously noted dermal cyst left supraclavicular region has
been resected.
IMPRESSION: Well-circumscribed homogeneous mass in the right mid neck. This may
represent enlarged lymph node and biopsy recommended. Differential
diagnosis includes a complex cyst. No other pathologically enlarged
lymph nodes in the neck.

Left parotidectomy without recurrent Warthin's tumor.

## 2022-04-29 DIAGNOSIS — G4733 Obstructive sleep apnea (adult) (pediatric): Secondary | ICD-10-CM | POA: Diagnosis not present

## 2022-04-29 DIAGNOSIS — C8190 Hodgkin lymphoma, unspecified, unspecified site: Secondary | ICD-10-CM | POA: Diagnosis not present

## 2022-04-29 DIAGNOSIS — E785 Hyperlipidemia, unspecified: Secondary | ICD-10-CM | POA: Diagnosis not present

## 2022-04-29 DIAGNOSIS — T148XXA Other injury of unspecified body region, initial encounter: Secondary | ICD-10-CM | POA: Diagnosis not present

## 2022-04-29 DIAGNOSIS — M6281 Muscle weakness (generalized): Secondary | ICD-10-CM | POA: Diagnosis not present

## 2022-04-29 DIAGNOSIS — Z0000 Encounter for general adult medical examination without abnormal findings: Secondary | ICD-10-CM | POA: Diagnosis not present

## 2022-04-29 DIAGNOSIS — M62838 Other muscle spasm: Secondary | ICD-10-CM | POA: Diagnosis not present

## 2022-04-29 DIAGNOSIS — E1169 Type 2 diabetes mellitus with other specified complication: Secondary | ICD-10-CM | POA: Diagnosis not present

## 2022-04-29 DIAGNOSIS — N393 Stress incontinence (female) (male): Secondary | ICD-10-CM | POA: Diagnosis not present

## 2022-05-06 DIAGNOSIS — Z0000 Encounter for general adult medical examination without abnormal findings: Secondary | ICD-10-CM | POA: Diagnosis not present

## 2022-05-20 DIAGNOSIS — M62838 Other muscle spasm: Secondary | ICD-10-CM | POA: Diagnosis not present

## 2022-05-20 DIAGNOSIS — M6281 Muscle weakness (generalized): Secondary | ICD-10-CM | POA: Diagnosis not present

## 2022-05-20 DIAGNOSIS — N393 Stress incontinence (female) (male): Secondary | ICD-10-CM | POA: Diagnosis not present

## 2022-06-27 ENCOUNTER — Other Ambulatory Visit: Payer: Self-pay

## 2022-06-28 DIAGNOSIS — M62838 Other muscle spasm: Secondary | ICD-10-CM | POA: Diagnosis not present

## 2022-06-28 DIAGNOSIS — N393 Stress incontinence (female) (male): Secondary | ICD-10-CM | POA: Diagnosis not present

## 2022-06-28 DIAGNOSIS — M6281 Muscle weakness (generalized): Secondary | ICD-10-CM | POA: Diagnosis not present

## 2022-06-29 NOTE — Progress Notes (Addendum)
COVID Vaccine received:  '[]'$  No '[x]'$  Yes  Date of any COVID positive Test in last 90 days: None  PCP - London Pepper, MD Cardiologist - none  Chest x-ray - N/A CT Chest/ Abd/ Pelvis-  04-16-22  Epic EKG -  07-01-22  at PST  (previous 01-17-21) Stress Test - N/A ECHO - 11-2020  Epic Cardiac Cath - N/A  Bowel Prep - Miralax, Fleet, Clear Liquids day prior  History of Sleep Apnea? '[]'$  No '[x]'$  Yes  '[]'$  unknown CPAP used?- '[]'$  No '[x]'$  Yes  (Instruct to bring their mask & Tubing)  Does the patient monitor blood sugar? '[x]'$  No '[]'$  Yes  '[]'$  N/A  Blood Thinner Instructions: None Aspirin Instructions: None   Comments: Portacath was removed 2022  Anesthesia review: Hodgkin's Lymphoma, OSA  Patient denies shortness of breath, fever, cough and chest pain at PAT appointment  Patient verbalized understanding and agreement to the Pre-Surgical Instructions that were given to them at this PAT appointment. Patient was also educated of the need to review these PAT instructions again prior to his/her surgery.I reviewed the appropriate phone numbers to call if they have any and questions or concerns.

## 2022-06-30 NOTE — Patient Instructions (Addendum)
DUE TO SPACE LIMITATIONS, ONLY TWO VISITORS  (aged 62 and older) ARE ALLOWED TO COME WITH YOU AND STAY IN THE WAITING ROOM DURING YOUR PRE OP AND PROCEDURE.   **NO VISITORS ARE ALLOWED IN THE SHORT STAY AREA OR RECOVERY ROOM!!**  IF YOU WILL BE ADMITTED INTO THE HOSPITAL YOU ARE ALLOWED ONLY FOUR SUPPORT PEOPLE DURING VISITATION HOURS (7 AM -8PM)   The support person(s) must pass our screening, and use Hand sanitizing gel. Visitors GUEST BADGE MUST BE WORN VISIBLY  One adult visitor may remain with you overnight and MUST be in the room by 8 P.M.   You are not required to quarantine at this time prior to your surgery. However, you must do this: Hand Hygiene often Do NOT share personal items Notify your provider if you are in close contact with someone who has COVID or you develop fever 100.4 or greater, new onset of sneezing, cough, sore throat, shortness of breath or body aches.       Your procedure is scheduled on:  Thursday July 07, 2022  Report to [facility] Main Entrance.  Report to admitting at:  05:15   AM  +++++Call this number if you have any questions or problems the morning of surgery [phone]   HAVE A CLEAR LIQUID DIET THE DAY BEFORE YOUR SURGERY:  Clear Liquid Diet Water Black Coffee (sugar ok, NO MILK/CREAM OR CREAMERS)  Tea (sugar ok, NO MILK/CREAM OR CREAMERS) regular and decaf                             Plain Jell-O (NO RED)                                           Fruit ices (not with fruit pulp, NO RED)                                     Popsicles (NO RED)                                                                  Juice: apple, WHITE grape, WHITE cranberry Sports drinks like Gatorade (NO RED)             FOLLOW BOWEL PREP AND ANY ADDITIONAL PRE OP INSTRUCTIONS YOU RECEIVED FROM YOUR SURGEON'S OFFICE!!!  MIRALAX  -  Dissolve 17 grams in 4 ounces of water AND DRINK AT NOON DAY BEFORE YOUR SURGERY.               FLEET ENEMA USE ONE FLEET  ENEMA NIGHT BEFORE SURGERY   Oral Hygiene is also important to reduce your risk of infection.        Remember - BRUSH YOUR TEETH THE MORNING OF SURGERY WITH YOUR REGULAR TOOTHPASTE  Take ONLY these medicines the morning of surgery with A SIP OF WATER: Amlodipine METFORMIN -  Take usual dose the day before surgery and DO NOT take the day of your Surgery.   Bring CPAP mask and tubing day of surgery.  You may not have any metal on your body including  jewelry, and body piercing  Do not wear lotions, powders, cologne, or deodorant  Men may shave face and neck.  Contacts, Hearing Aids, dentures or bridgework may not be worn into surgery.   You may bring a small overnight bag with you on the day of surgery, only pack items that are not valuable .[facility] IS NOT RESPONSIBLE   FOR VALUABLES THAT ARE LOST OR STOLEN.   DO NOT [place]. PHARMACY WILL DISPENSE MEDICATIONS LISTED ON YOUR MEDICATION LIST TO YOU DURING YOUR ADMISSION [place]!   Special Instructions: Bring a copy of your healthcare power of attorney and living will documents the day of surgery, if you wish to have them scanned into your [facility] Records- EPIC  Please read over the following fact sheets you were given: IF YOU HAVE QUESTIONS ABOUT YOUR PRE-OP INSTRUCTIONS, PLEASE CALL [phone]  ([place])   [place] - Preparing for Surgery Before surgery, you can play an important role.  Because skin is not sterile, your skin needs to be as free of germs as possible.  You can reduce the number of germs on your skin by washing with CHG (chlorahexidine gluconate) soap before surgery.  CHG is an antiseptic cleaner which kills germs and bonds with the skin to continue killing germs even after washing. Please DO NOT use if you have an allergy to CHG or antibacterial soaps.  If your skin becomes reddened/irritated stop using the CHG and inform your nurse when you arrive  at Short Stay. Do not shave (including legs and underarms) for at least 48 hours prior to the first CHG shower.  You may shave your face/neck.  Please follow these instructions carefully:  1.  Shower with CHG Soap the night before surgery and the  morning of surgery.  2.  If you choose to wash your hair, wash your hair first as usual with your normal  shampoo.  3.  After you shampoo, rinse your hair and body thoroughly to remove the shampoo.                             4.  Use CHG as you would any other liquid soap.  You can apply chg directly to the skin and wash.  Gently with a scrungie or clean washcloth.  5.  Apply the CHG Soap to your body ONLY FROM THE NECK DOWN.   Do not use on face/ open                           Wound or open sores. Avoid contact with eyes, ears mouth and genitals (private parts).                       Wash face,  Genitals (private parts) with your normal soap.             6.  Wash thoroughly, paying special attention to the area where your  surgery  will be performed.  7.  Thoroughly rinse your body with warm water from the neck down.  8.  DO NOT shower/wash with your normal soap after using and rinsing off the CHG Soap.            9.  Pat yourself dry with a clean towel.  10.  Wear clean pajamas.            11.  Place clean sheets on your bed the night of your first shower and do not  sleep with pets.  ON THE DAY OF SURGERY : Do not apply any lotions/deodorants the morning of surgery.  Please wear clean clothes to the hospital/surgery center.    FAILURE TO FOLLOW THESE INSTRUCTIONS MAY RESULT IN THE CANCELLATION OF YOUR SURGERY  PATIENT SIGNATURE_________________________________  NURSE SIGNATURE__________________________________  ________________________________________________________________________    [name]    An incentive spirometer is a tool that can help keep your lungs clear and active. This tool measures how well you are  filling your lungs with each breath. Taking long deep breaths may help reverse or decrease the chance of developing breathing (pulmonary) problems (especially infection) following: A long period of time when you are unable to move or be active. BEFORE THE PROCEDURE  If the spirometer includes an indicator to show your best effort, your nurse or respiratory therapist will set it to a desired goal. If possible, sit up straight or lean slightly forward. Try not to slouch. Hold the incentive spirometer in an upright position. INSTRUCTIONS FOR USE  Sit on the edge of your bed if possible, or sit up as far as you can in bed or on a chair. Hold the incentive spirometer in an upright position. Breathe out normally. Place the mouthpiece in your mouth and seal your lips tightly around it. Breathe in slowly and as deeply as possible, raising the piston or the ball toward the top of the column. Hold your breath for 3-5 seconds or for as long as possible. Allow the piston or ball to fall to the bottom of the column. Remove the mouthpiece from your mouth and breathe out normally. Rest for a few seconds and repeat Steps 1 through 7 at least 10 times every 1-2 hours when you are awake. Take your time and take a few normal breaths between deep breaths. The spirometer may include an indicator to show your best effort. Use the indicator as a goal to work toward during each repetition. After each set of 10 deep breaths, practice coughing to be sure your lungs are clear. If you have an incision (the cut made at the time of surgery), support your incision when coughing by placing a pillow or rolled up towels firmly against it. Once you are able to get out of bed, walk around indoors and cough well. You may stop using the incentive spirometer when instructed by your caregiver.  RISKS AND COMPLICATIONS Take your time so you do not get dizzy or light-headed. If you are in pain, you may need to take or ask for pain  medication before doing incentive spirometry. It is harder to take a deep breath if you are having pain. AFTER USE Rest and breathe slowly and easily. It can be helpful to keep track of a log of your progress. Your caregiver can provide you with a simple table to help with this. If you are using the spirometer at home, follow these instructions: [place] IF:  You are having difficultly using the spirometer. You have trouble using the spirometer as often as instructed. Your pain medication is not giving enough relief while using the spirometer. You develop fever of 100.5 F (38.1 C) or higher.  SEEK IMMEDIATE MEDICAL CARE IF:  You cough up bloody sputum that had not been present before. You develop fever of 102 F (38.9 C) or greater. You develop worsening pain at or near the incision site. MAKE SURE YOU:  Understand these instructions. Will watch your condition. Will get help right away if you are not doing well or get worse. Document Released: 04/03/2007 Document Revised: 02/13/2012 Document Reviewed: 06/04/2007 [facility] Patient Information 2014 [place], [place].

## 2022-07-01 ENCOUNTER — Other Ambulatory Visit: Payer: Self-pay

## 2022-07-01 ENCOUNTER — Encounter (HOSPITAL_COMMUNITY)
Admission: RE | Admit: 2022-07-01 | Discharge: 2022-07-01 | Disposition: A | Discharge status: Home / Self Care | Payer: BC Managed Care – PPO | Source: Ambulatory Visit | Attending: Urology | Admitting: Urology

## 2022-07-01 ENCOUNTER — Encounter (HOSPITAL_COMMUNITY): Payer: Self-pay

## 2022-07-01 VITALS — BP 146/83 | HR 96 | Temp 98.0°F | Resp 20 | Ht 70.0 in | Wt 263.0 lb

## 2022-07-01 DIAGNOSIS — E119 Type 2 diabetes mellitus without complications: Secondary | ICD-10-CM | POA: Insufficient documentation

## 2022-07-01 DIAGNOSIS — I10 Essential (primary) hypertension: Secondary | ICD-10-CM | POA: Diagnosis not present

## 2022-07-01 DIAGNOSIS — Z01818 Encounter for other preprocedural examination: Secondary | ICD-10-CM | POA: Diagnosis not present

## 2022-07-01 HISTORY — DX: Sleep apnea, unspecified: G47.30

## 2022-07-01 LAB — BASIC METABOLIC PANEL
Anion gap: 7 (ref 5–15)
BUN: 13 mg/dL (ref 8–23)
CO2: 22 mmol/L (ref 22–32)
Calcium: 9.3 mg/dL (ref 8.9–10.3)
Chloride: 107 mmol/L (ref 98–111)
Creatinine, Ser: 0.92 mg/dL (ref 0.61–1.24)
GFR, Estimated: >60 mL/min (ref >60)
Glucose, Bld: 156 mg/dL — ABNORMAL HIGH (ref 70–99)
Potassium: 3.9 mmol/L (ref 3.5–5.1)
Sodium: 136 mmol/L (ref 135–145)

## 2022-07-01 LAB — CBC
HCT: 47.3 % (ref 39.0–52.0)
Hemoglobin: 15.7 g/dL (ref 13.0–17.0)
MCH: 29.3 pg (ref 26.0–34.0)
MCHC: 33.2 g/dL (ref 30.0–36.0)
MCV: 88.2 fL (ref 80.0–100.0)
Platelets: 185 10*3/uL (ref 150–400)
RBC: 5.36 MIL/uL (ref 4.22–5.81)
RDW: 13.5 % (ref 11.5–15.5)
WBC: 7.6 10*3/uL (ref 4.0–10.5)
nRBC: 0 % (ref 0.0–0.2)

## 2022-07-01 LAB — GLUCOSE, CAPILLARY: Glucose-Capillary: 182 mg/dL — ABNORMAL HIGH (ref 70–99)

## 2022-07-01 LAB — HEMOGLOBIN A1C
Hgb A1c MFr Bld: 6.6 % — ABNORMAL HIGH (ref 4.8–5.6)
Mean Plasma Glucose: 142.72 mg/dL

## 2022-07-06 NOTE — H&P (Signed)
Office Visit Report     07/05/2022   --------------------------------------------------------------------------------   [name]  MRN: [id number]  DOB: [birth year], 61 year old Male  SSN: [id number]   PRIMARY CARE:  Dean Fairly, MD  REFERRING:    PROVIDER:  Ellison Bell, M.D.  SUPERVISING:  [name], M.D.  TREATING:  [name] [facility] Resident)  LOCATION:  [facility] [phone]     --------------------------------------------------------------------------------   CC/HPI: CC: Prostate Cancer   Physician requesting consult: Dr. Ellison Bell  PCP: Dr. London Bell   [name] is a 61 year old gentleman who was initially diagnosed with very low risk prostate cancer by Dr. Kathie Bell in 2015. He elected active surveillance and transferred his care to Dr. Lovena Bell following Dr. Simone Bell retirement. 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 He was counseled about his options and was considering definitive surgical therapy but was diagnosed and treated for Hodgkin's lymphoma. An MRI of the prostate in November 2021 was reassuringly negative for suspicious lesions. His most recent PSA was 2.96 and a repeat MRI of the prostate was performed on 02/18/22 that now demonstrated a 9 mm left posterior mid gland PIRADS 4 lesion. He was going to be scheduled for an MR/US fusion biopsy but canceled this and is seen to day in consultation to discuss his options for management.   He is followed by Dr. Narda Bell for his lymphoma and is reportedly in remission.   Family history: He has a strong family history of prostate cancer with 3 brothers and his father all having been diagnosed.   Imaging studies: MRI (02/18/22) - No EPE, SVI, LAD, or bone lesions.   PMH: He has a history of diabetes, hypertension, GERD, hyperlipidemia and obesity (BMI 37).  PSH: No abdominal surgeries.   TNM stage:  cT1c N0 Mx  PSA: 2.96  Gleason score: 3+4=7 (GG 2)  Biopsy (05/01/20): 2/12 cores positive  Left: L apex (10%, 3+3=6), L lateral base (< 10%, 3+4=7, PNI)  Right: Benign  Prostate volume: 39.8 cc   Interval 07/05/22:  Today he returns for pre-op evaluation for robotic prostatectomy on 07/07/22. He is doing well overall. Saw his oncologist in May and confirmed in remission from [place]. Has lost 5 pounds with diet in anticipation for surgery.   Interval 07/05/22:  Returns today for pre-op evaluation prior to robotic prostatectomy on 07/05/22. Saw his oncologist in May 2023 and confirmed remission from [place].   Interval 07/05/22:  Returns to clinic today for evaluation prior to prostatectomy with Dr. Alinda Bell on 07/07/22. Saw his oncologist in May 2023   Nomogram  OC disease: 86%  EPE: 19%  SVI: 1%  LNI: 2%  PFS (5 year, 10 year): 92%, 86%   Urinary function: IPSS is 16.  Erectile function: SHIM score is 9.     ALLERGIES: No Allergies    MEDICATIONS: Metformin Hcl 500 mg tablet  Prilosec  Amlodipine Besylate  Crestor 5 mg tablet Oral  Losartan Potassium 100 mg tablet Oral  Tylenol 325 mg tablet Oral     GU PSH: Prostate Needle Biopsy - 2021, 2020, 2019       [place] Notes: Nose Surgery, benign sebacous cyst removed from left axilla 10-2018   NON-GU PSH: Surgical Pathology, Gross And Microscopic Examination For Prostate Needle - 2021, 2020, 2019     GU PMH: Stress Incontinence - 06/28/2022, - 05/20/2022, - 04/29/2022 Prostate Cancer - 04/05/2022, -  10/26/2021, - 2018, - 2017, Adenocarcinoma of prostate, - 2016 BPH w/LUTS - 10/26/2021, - 2018 Nocturia - 10/26/2021, - 2018 Family Hx of Prostate Cancer - 2021 ED due to arterial insufficiency, Erectile dysfunction due to arterial insufficiency - 2016 Male ED, unspecified, Organic erectile dysfunction - 2016 Elevated PSA, PSA elevation - 2015      PMH Notes:  1) Prostate cancer: He is s/p a BNS RAL radical prostatectomy and BPLND on  07/07/22.   Diagnosis:  Pretreatment PSA: 2.96  Pretreatment SHIM score: 9   NON-GU PMH: Muscle weakness (generalized) - 06/28/2022, - 05/20/2022, - 04/29/2022 Other muscle spasm - 06/28/2022, - 05/20/2022, - 04/29/2022 Diabetes Type 2 Hypercholesterolemia Hypertension Sleep Apnea    FAMILY HISTORY: cerebral infarction - Runs In Family Hypertension - Runs In Family leukemia - Runs In Family Lung Cancer - Runs In Family malignant neoplasm of breast - Runs In Family Myocardial Infarction - Runs In Family ovarian cancer - Runs In Family pancreatic cancer - Runs In Family Prostate Cancer - Runs In Family   SOCIAL HISTORY: Marital Status: Single Preferred Language: English; Ethnicity: Not Hispanic Or Latino; Race: White Current Smoking Status: Patient smokes occasionally. Smokes 1 pack per day.  Does not drink anymore.  Drinks 2 caffeinated drinks per day. 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     Notes: Current every day smoker, Widower, Caffeine use, Alcohol use, Father deceased, Mother deceased, Occupation, Number of children   REVIEW OF SYSTEMS:    GU Review Male:   Patient denies get up at night to urinate, frequent urination, burning/ pain with urination, stream starts and stops, leakage of urine, hard to postpone urination, erection problems, have to strain to urinate , penile pain, and trouble starting your stream.  Gastrointestinal (Upper):   Patient denies nausea, vomiting, and indigestion/ heartburn.  Gastrointestinal (Lower):   Patient denies diarrhea and constipation.  Constitutional:   Patient denies fever, night sweats, weight loss, and fatigue.  Skin:   Patient denies skin rash/ lesion and itching.  Eyes:   Patient denies blurred vision and double vision.  Ears/ Nose/ Throat:   Patient denies sore throat and sinus problems.  Hematologic/Lymphatic:   Patient denies swollen glands and easy bruising.  Cardiovascular:   Patient denies leg swelling and chest  pains.  Respiratory:   Patient denies cough and shortness of breath.  Endocrine:   Patient denies excessive thirst.  Musculoskeletal:   Patient denies back pain and joint pain.  Neurological:   Patient denies headaches and dizziness.  Psychologic:   Patient denies depression and anxiety.   VITAL SIGNS:      07/05/2022 02:01 PM  BP 137/79 mmHg  Pulse 95 /min  Temperature 98.9 F / 37.1 C   MULTI-SYSTEM PHYSICAL EXAMINATION:    Constitutional: Well-nourished. No physical deformities. Normally developed. Good grooming.  Respiratory: No labored breathing, no use of accessory muscles.   Skin: No paleness, no jaundice, no cyanosis. No lesion, no ulcer, no rash.  Gastrointestinal: No mass, no tenderness, no rigidity, obese abdomen.   Musculoskeletal: Normal gait and station of head and neck.     Complexity of Data:  Source Of History:  Patient, Healthcare Provider, Outside Source  Lab Test Review:   BMP, CBC with Diff  Records Review:   Previous Patient Records  X-Ray Review: MRI Pelvis: Reviewed Report. Discussed With Patient.     01/21/22 10/26/21 11/20/20 03/13/20 09/04/19 12/26/18 06/11/18 10/30/17  PSA  Total PSA 2.96 ng/mL 3.93 ng/mL 3.58 ng/mL  3.51 ng/mL 4.32 ng/mL 5.83 ng/dl 3.87 ng/mL 2.62 ng/mL  Free PSA     0.28 ng/mL     % Free PSA     6 % PSA       PROCEDURES:          Urinalysis Dipstick Dipstick Cont'd  Color: Yellow Bilirubin: Neg mg/dL  Appearance: Clear Ketones: Neg mg/dL  Specific Gravity: 1.020 Blood: Neg ery/uL  pH: 5.5 Protein: Neg mg/dL  Glucose: Neg mg/dL Urobilinogen: 0.2 mg/dL    Nitrites: Neg    Leukocyte Esterase: Neg leu/uL    ASSESSMENT:      ICD-10 Details  1 GU:   Prostate Cancer - C61           Notes:   Discussed risks and benefits of robotic prostatecotmy including but not limited to leakage/incontinence, erectile dysfunction, infection, damage to surrounding structures, bleeding. He is eager to proceed. Urine dropped off today. No prior  abdominal surgeries, no blood thinners. went over expected hospital course and pre-operative instructions including clear diet day before and medications. All questions were answered.    PLAN:           Document Letter(s):  Created for Patient: Clinical Summary  Billing Summary:  Created    * Signed by [name] [facility] Resident) on 07/05/22 at 2:44 PM (EDT)*

## 2022-07-06 NOTE — Anesthesia Preprocedure Evaluation (Signed)
Anesthesia Evaluation  Patient identified by MRN, date of birth, ID band Patient awake    Reviewed: Allergy & Precautions, NPO status , Patient's Chart, lab work & pertinent test results  Airway Mallampati: III  TM Distance: >3 FB Neck ROM: Full    Dental  (+) Edentulous Upper, Poor Dentition, Dental Advisory Given,    Pulmonary sleep apnea and Continuous Positive Airway Pressure Ventilation , former smoker,    Pulmonary exam normal breath sounds clear to auscultation       Cardiovascular hypertension, Pt. on medications Normal cardiovascular exam Rhythm:Regular Rate:Normal  TTE 2021 1. Left ventricular ejection fraction, by estimation, is 60 to 65%. The  left ventricle has normal function. The left ventricle has no regional  wall motion abnormalities. Left ventricular diastolic parameters are  consistent with Grade I diastolic  dysfunction (impaired relaxation). The average left ventricular global  longitudinal strain is -27.4 %. The global longitudinal strain is normal.  2. Right ventricular systolic function is normal. The right ventricular  size is normal.  3. The mitral valve is normal in structure. No evidence of mitral valve  regurgitation. No evidence of mitral stenosis.  4. The aortic valve is normal in structure. Aortic valve regurgitation is  not visualized. No aortic stenosis is present.  5. There is borderline dilatation of the ascending aorta, measuring 38  mm.  6. The inferior vena cava is normal in size with greater than 50%  respiratory variability, suggesting right atrial pressure of 3 mmHg.    Neuro/Psych negative neurological ROS  negative psych ROS   GI/Hepatic Neg liver ROS, GERD  ,  Endo/Other  diabetes, Type 2, Oral Hypoglycemic AgentsObese BMI 37  Renal/GU negative Renal ROS  negative genitourinary   Musculoskeletal negative musculoskeletal ROS (+)   Abdominal   Peds   Hematology negative hematology ROS (+)   Anesthesia Other Findings   Reproductive/Obstetrics                            Anesthesia Physical Anesthesia Plan  ASA: 3  Anesthesia Plan: General   Post-op Pain Management: Tylenol PO (pre-op)* and Ketamine IV*   Induction: Intravenous  PONV Risk Score and Plan: 2 and Midazolam, Dexamethasone and Ondansetron  Airway Management Planned: Oral ETT  Additional Equipment:   Intra-op Plan:   Post-operative Plan: Extubation in OR  Informed Consent: I have reviewed the patients History and Physical, chart, labs and discussed the procedure including the risks, benefits and alternatives for the proposed anesthesia with the patient or authorized representative who has indicated his/her understanding and acceptance.     Dental advisory given  Plan Discussed with: CRNA  Anesthesia Plan Comments: (2 IVs)        Anesthesia Quick Evaluation

## 2022-07-07 ENCOUNTER — Other Ambulatory Visit: Payer: Self-pay

## 2022-07-07 ENCOUNTER — Ambulatory Visit (HOSPITAL_COMMUNITY): Payer: BC Managed Care – PPO | Admitting: Anesthesiology

## 2022-07-07 ENCOUNTER — Ambulatory Visit: Payer: BC Managed Care – PPO | Admitting: Hematology and Oncology

## 2022-07-07 ENCOUNTER — Observation Stay (HOSPITAL_COMMUNITY)
Admission: RE | Admit: 2022-07-07 | Discharge: 2022-07-08 | Disposition: A | Discharge status: Home / Self Care | Payer: BC Managed Care – PPO | Attending: Urology | Admitting: Urology

## 2022-07-07 ENCOUNTER — Encounter (HOSPITAL_COMMUNITY): Payer: Self-pay | Admitting: Urology

## 2022-07-07 ENCOUNTER — Encounter (HOSPITAL_COMMUNITY)
Admission: RE | Disposition: A | Discharge status: Home / Self Care | Payer: Self-pay | Source: Home / Self Care | Attending: Urology

## 2022-07-07 ENCOUNTER — Other Ambulatory Visit: Payer: BC Managed Care – PPO

## 2022-07-07 DIAGNOSIS — E119 Type 2 diabetes mellitus without complications: Secondary | ICD-10-CM | POA: Insufficient documentation

## 2022-07-07 DIAGNOSIS — I10 Essential (primary) hypertension: Secondary | ICD-10-CM | POA: Insufficient documentation

## 2022-07-07 DIAGNOSIS — C61 Malignant neoplasm of prostate: Secondary | ICD-10-CM | POA: Diagnosis not present

## 2022-07-07 DIAGNOSIS — Z79899 Other long term (current) drug therapy: Secondary | ICD-10-CM | POA: Insufficient documentation

## 2022-07-07 DIAGNOSIS — F17210 Nicotine dependence, cigarettes, uncomplicated: Secondary | ICD-10-CM | POA: Diagnosis not present

## 2022-07-07 DIAGNOSIS — Z87891 Personal history of nicotine dependence: Secondary | ICD-10-CM | POA: Diagnosis not present

## 2022-07-07 DIAGNOSIS — G4733 Obstructive sleep apnea (adult) (pediatric): Secondary | ICD-10-CM | POA: Diagnosis not present

## 2022-07-07 HISTORY — PX: LYMPHADENECTOMY: SHX5960

## 2022-07-07 HISTORY — PX: ROBOT ASSISTED LAPAROSCOPIC RADICAL PROSTATECTOMY: SHX5141

## 2022-07-07 LAB — GLUCOSE, CAPILLARY
Glucose-Capillary: 125 mg/dL — ABNORMAL HIGH (ref 70–99)
Glucose-Capillary: 151 mg/dL — ABNORMAL HIGH (ref 70–99)
Glucose-Capillary: 176 mg/dL — ABNORMAL HIGH (ref 70–99)
Glucose-Capillary: 128 mg/dL — ABNORMAL HIGH (ref 70–99)
Glucose-Capillary: 127 mg/dL — ABNORMAL HIGH (ref 70–99)

## 2022-07-07 LAB — ABO/RH: ABO/RH(D): O POS

## 2022-07-07 LAB — HEMOGLOBIN AND HEMATOCRIT, BLOOD
HCT: 46.6 % (ref 39.0–52.0)
Hemoglobin: 15.2 g/dL (ref 13.0–17.0)

## 2022-07-07 LAB — TYPE AND SCREEN
ABO/RH(D): O POS
Antibody Screen: NEGATIVE

## 2022-07-07 SURGERY — XI ROBOTIC ASSISTED LAPAROSCOPIC RADICAL PROSTATECTOMY LEVEL 2
Anesthesia: General

## 2022-07-07 MED ORDER — KETAMINE HCL 10 MG/ML IJ SOLN
INTRAMUSCULAR | Status: AC
  Filled 2022-07-07: qty 1

## 2022-07-07 MED ORDER — ACETAMINOPHEN 325 MG PO TABS: 650.0000 mg | ORAL_TABLET | ORAL | Status: DC | PRN

## 2022-07-07 MED ORDER — LACTATED RINGERS IV SOLN
INTRAVENOUS | Status: DC | PRN
  Administered 2022-07-07: 1000 mL

## 2022-07-07 MED ORDER — CEFAZOLIN SODIUM-DEXTROSE 2-4 GM/100ML-% IV SOLN
2.0000 g | Freq: Once | INTRAVENOUS | Status: AC
  Administered 2022-07-07: 2 g via INTRAVENOUS
  Filled 2022-07-07: qty 100

## 2022-07-07 MED ORDER — ACETAMINOPHEN 500 MG PO TABS
1000.0000 mg | ORAL_TABLET | Freq: Four times a day (QID) | ORAL | Status: AC
  Administered 2022-07-07 – 2022-07-08 (×4): 1000 mg via ORAL
  Filled 2022-07-07 (×4): qty 2

## 2022-07-07 MED ORDER — LACTATED RINGERS IV SOLN: INTRAVENOUS | Status: DC

## 2022-07-07 MED ORDER — HEPARIN SODIUM (PORCINE) 1000 UNIT/ML IJ SOLN
INTRAMUSCULAR | Status: AC
Start: 2022-07-07 — End: ?
  Filled 2022-07-07: qty 1

## 2022-07-07 MED ORDER — BUPIVACAINE-EPINEPHRINE (PF) 0.25% -1:200000 IJ SOLN
INTRAMUSCULAR | Status: AC
Start: 2022-07-07 — End: ?
  Filled 2022-07-07: qty 30

## 2022-07-07 MED ORDER — TRIPLE ANTIBIOTIC 3.5-400-5000 EX OINT
1.0000 | TOPICAL_OINTMENT | Freq: Three times a day (TID) | CUTANEOUS | Status: DC | PRN
Start: 2022-07-07 — End: 2022-07-08

## 2022-07-07 MED ORDER — KETOROLAC TROMETHAMINE 15 MG/ML IJ SOLN
15.0000 mg | Freq: Four times a day (QID) | INTRAMUSCULAR | Status: DC
  Administered 2022-07-07 – 2022-07-08 (×4): 15 mg via INTRAVENOUS
  Filled 2022-07-07 (×4): qty 1

## 2022-07-07 MED ORDER — ORAL CARE MOUTH RINSE: 15.0000 mL | Freq: Once | OROMUCOSAL | Status: AC

## 2022-07-07 MED ORDER — HYOSCYAMINE SULFATE 0.125 MG SL SUBL: 0.1250 mg | SUBLINGUAL_TABLET | Freq: Four times a day (QID) | SUBLINGUAL | Status: DC | PRN

## 2022-07-07 MED ORDER — PHENYLEPHRINE HCL (PRESSORS) 10 MG/ML IV SOLN
INTRAVENOUS | Status: AC
Start: 2022-07-07 — End: ?
  Filled 2022-07-07: qty 1

## 2022-07-07 MED ORDER — FENTANYL CITRATE (PF) 250 MCG/5ML IJ SOLN
INTRAMUSCULAR | Status: AC
  Filled 2022-07-07: qty 5

## 2022-07-07 MED ORDER — PHENYLEPHRINE HCL-NACL 20-0.9 MG/250ML-% IV SOLN
INTRAVENOUS | Status: DC | PRN
  Administered 2022-07-07: 25 ug/min via INTRAVENOUS

## 2022-07-07 MED ORDER — LIDOCAINE HCL (PF) 2 % IJ SOLN
INTRAMUSCULAR | Status: AC
  Filled 2022-07-07: qty 5

## 2022-07-07 MED ORDER — ONDANSETRON HCL 4 MG/2ML IJ SOLN
4.0000 mg | INTRAMUSCULAR | Status: DC | PRN
  Filled 2022-07-07: qty 2

## 2022-07-07 MED ORDER — DIPHENHYDRAMINE HCL 50 MG/ML IJ SOLN: 12.5000 mg | Freq: Four times a day (QID) | INTRAMUSCULAR | Status: DC | PRN

## 2022-07-07 MED ORDER — PANTOPRAZOLE SODIUM 40 MG PO TBEC
40.0000 mg | DELAYED_RELEASE_TABLET | Freq: Every evening | ORAL | Status: DC
  Administered 2022-07-07: 40 mg via ORAL
  Filled 2022-07-07: qty 1

## 2022-07-07 MED ORDER — OXYCODONE HCL 5 MG PO TABS: 5.0000 mg | ORAL_TABLET | ORAL | Status: DC | PRN

## 2022-07-07 MED ORDER — SULFAMETHOXAZOLE-TRIMETHOPRIM 800-160 MG PO TABS: 1.0000 | ORAL_TABLET | Freq: Two times a day (BID) | ORAL | 0 refills | Status: DC

## 2022-07-07 MED ORDER — FENTANYL CITRATE PF 50 MCG/ML IJ SOSY: 25.0000 ug | PREFILLED_SYRINGE | INTRAMUSCULAR | Status: DC | PRN

## 2022-07-07 MED ORDER — KETAMINE HCL 10 MG/ML IJ SOLN
INTRAMUSCULAR | Status: DC | PRN
  Administered 2022-07-07: 40 mg via INTRAVENOUS

## 2022-07-07 MED ORDER — PROPOFOL 10 MG/ML IV BOLUS
INTRAVENOUS | Status: DC | PRN
  Administered 2022-07-07: 150 mg via INTRAVENOUS

## 2022-07-07 MED ORDER — FLEET ENEMA 7-19 GM/118ML RE ENEM: 1.0000 | ENEMA | Freq: Once | RECTAL | Status: DC

## 2022-07-07 MED ORDER — MIDAZOLAM HCL 2 MG/2ML IJ SOLN
INTRAMUSCULAR | Status: AC
  Filled 2022-07-07: qty 2

## 2022-07-07 MED ORDER — EPHEDRINE 5 MG/ML INJ
INTRAVENOUS | Status: AC
Start: 2022-07-07 — End: ?
  Filled 2022-07-07: qty 5

## 2022-07-07 MED ORDER — SENNOSIDES-DOCUSATE SODIUM 8.6-50 MG PO TABS
2.0000 | ORAL_TABLET | Freq: Every day | ORAL | Status: DC
  Administered 2022-07-07: 2 via ORAL
  Filled 2022-07-07: qty 2

## 2022-07-07 MED ORDER — AMLODIPINE BESYLATE 5 MG PO TABS
5.0000 mg | ORAL_TABLET | Freq: Every evening | ORAL | Status: DC
  Administered 2022-07-07: 5 mg via ORAL
  Filled 2022-07-07: qty 1

## 2022-07-07 MED ORDER — SODIUM CHLORIDE 0.9 % IV BOLUS
1000.0000 mL | Freq: Once | INTRAVENOUS | Status: AC
  Administered 2022-07-07: 1000 mL via INTRAVENOUS

## 2022-07-07 MED ORDER — BUPIVACAINE-EPINEPHRINE 0.25% -1:200000 IJ SOLN
INTRAMUSCULAR | Status: DC | PRN
  Administered 2022-07-07: 30 mL

## 2022-07-07 MED ORDER — POLYETHYLENE GLYCOL 3350 17 G PO PACK: 17.0000 g | PACK | Freq: Every day | ORAL | Status: DC

## 2022-07-07 MED ORDER — MORPHINE SULFATE (PF) 2 MG/ML IV SOLN
2.0000 mg | INTRAVENOUS | Status: DC | PRN
  Administered 2022-07-07 (×3): 2 mg via INTRAVENOUS
  Filled 2022-07-07 (×3): qty 1

## 2022-07-07 MED ORDER — ORAL CARE MOUTH RINSE: 15.0000 mL | OROMUCOSAL | Status: DC | PRN

## 2022-07-07 MED ORDER — FENTANYL CITRATE (PF) 250 MCG/5ML IJ SOLN
INTRAMUSCULAR | Status: DC | PRN
Start: 2022-07-07 — End: 2022-07-07
  Administered 2022-07-07: 50 ug via INTRAVENOUS
  Administered 2022-07-07: 100 ug via INTRAVENOUS
  Administered 2022-07-07 (×2): 50 ug via INTRAVENOUS

## 2022-07-07 MED ORDER — PHENYLEPHRINE 80 MCG/ML (10ML) SYRINGE FOR IV PUSH (FOR BLOOD PRESSURE SUPPORT)
PREFILLED_SYRINGE | INTRAVENOUS | Status: DC | PRN
  Administered 2022-07-07: 240 ug via INTRAVENOUS
  Administered 2022-07-07 (×3): 160 ug via INTRAVENOUS
  Administered 2022-07-07: 80 ug via INTRAVENOUS
  Administered 2022-07-07: 160 ug via INTRAVENOUS

## 2022-07-07 MED ORDER — POTASSIUM CHLORIDE IN NACL 20-0.45 MEQ/L-% IV SOLN
INTRAVENOUS | Status: DC
  Filled 2022-07-07 (×4): qty 1000

## 2022-07-07 MED ORDER — PROPOFOL 10 MG/ML IV BOLUS
INTRAVENOUS | Status: AC
  Filled 2022-07-07: qty 20

## 2022-07-07 MED ORDER — MIDAZOLAM HCL 2 MG/2ML IJ SOLN
INTRAMUSCULAR | Status: DC | PRN
  Administered 2022-07-07: 2 mg via INTRAVENOUS

## 2022-07-07 MED ORDER — DOCUSATE SODIUM 100 MG PO CAPS: 100.0000 mg | ORAL_CAPSULE | Freq: Two times a day (BID) | ORAL | Status: DC

## 2022-07-07 MED ORDER — DIPHENHYDRAMINE HCL 12.5 MG/5ML PO ELIX: 12.5000 mg | ORAL_SOLUTION | Freq: Four times a day (QID) | ORAL | Status: DC | PRN

## 2022-07-07 MED ORDER — ROSUVASTATIN CALCIUM 5 MG PO TABS
5.0000 mg | ORAL_TABLET | Freq: Every evening | ORAL | Status: DC
  Administered 2022-07-07: 5 mg via ORAL
  Filled 2022-07-07: qty 1

## 2022-07-07 MED ORDER — ZOLPIDEM TARTRATE 5 MG PO TABS
5.0000 mg | ORAL_TABLET | Freq: Every evening | ORAL | Status: DC | PRN
  Administered 2022-07-07: 5 mg via ORAL
  Filled 2022-07-07: qty 1

## 2022-07-07 MED ORDER — ROCURONIUM BROMIDE 10 MG/ML (PF) SYRINGE
PREFILLED_SYRINGE | INTRAVENOUS | Status: AC
Start: 2022-07-07 — End: ?
  Filled 2022-07-07: qty 10

## 2022-07-07 MED ORDER — ROCURONIUM BROMIDE 10 MG/ML (PF) SYRINGE
PREFILLED_SYRINGE | INTRAVENOUS | Status: DC | PRN
  Administered 2022-07-07: 40 mg via INTRAVENOUS
  Administered 2022-07-07: 100 mg via INTRAVENOUS

## 2022-07-07 MED ORDER — EPHEDRINE SULFATE-NACL 50-0.9 MG/10ML-% IV SOSY
PREFILLED_SYRINGE | INTRAVENOUS | Status: DC | PRN
  Administered 2022-07-07 (×3): 5 mg via INTRAVENOUS

## 2022-07-07 MED ORDER — DOCUSATE SODIUM 100 MG PO CAPS
100.0000 mg | ORAL_CAPSULE | Freq: Two times a day (BID) | ORAL | Status: DC
  Administered 2022-07-07 – 2022-07-08 (×2): 100 mg via ORAL
  Filled 2022-07-07 (×2): qty 1

## 2022-07-07 MED ORDER — TRAMADOL HCL 50 MG PO TABS: 50.0000 mg | ORAL_TABLET | Freq: Four times a day (QID) | ORAL | 0 refills | Status: DC | PRN

## 2022-07-07 MED ORDER — ONDANSETRON HCL 4 MG/2ML IJ SOLN
INTRAMUSCULAR | Status: DC | PRN
  Administered 2022-07-07: 4 mg via INTRAVENOUS

## 2022-07-07 MED ORDER — LOSARTAN POTASSIUM 50 MG PO TABS
100.0000 mg | ORAL_TABLET | Freq: Every evening | ORAL | Status: DC
  Administered 2022-07-07: 100 mg via ORAL
  Filled 2022-07-07: qty 2

## 2022-07-07 MED ORDER — STERILE WATER FOR IRRIGATION IR SOLN
Status: DC | PRN
  Administered 2022-07-07: 1000 mL

## 2022-07-07 MED ORDER — PHENYLEPHRINE 80 MCG/ML (10ML) SYRINGE FOR IV PUSH (FOR BLOOD PRESSURE SUPPORT)
PREFILLED_SYRINGE | INTRAVENOUS | Status: AC
  Filled 2022-07-07: qty 20

## 2022-07-07 MED ORDER — INSULIN ASPART 100 UNIT/ML IJ SOLN
0.0000 [IU] | INTRAMUSCULAR | Status: DC
  Administered 2022-07-07: 2 [IU] via SUBCUTANEOUS
  Administered 2022-07-07: 3 [IU] via SUBCUTANEOUS
  Administered 2022-07-07 – 2022-07-08 (×3): 2 [IU] via SUBCUTANEOUS

## 2022-07-07 MED ORDER — ACETAMINOPHEN 500 MG PO TABS
1000.0000 mg | ORAL_TABLET | Freq: Once | ORAL | Status: AC
  Administered 2022-07-07: 1000 mg via ORAL
  Filled 2022-07-07: qty 2

## 2022-07-07 MED ORDER — ONDANSETRON HCL 4 MG/2ML IJ SOLN
INTRAMUSCULAR | Status: AC
Start: 2022-07-07 — End: ?
  Filled 2022-07-07: qty 2

## 2022-07-07 MED ORDER — DEXAMETHASONE SODIUM PHOSPHATE 10 MG/ML IJ SOLN
INTRAMUSCULAR | Status: DC | PRN
  Administered 2022-07-07: 8 mg via INTRAVENOUS

## 2022-07-07 MED ORDER — LIDOCAINE HCL (CARDIAC) PF 100 MG/5ML IV SOSY
PREFILLED_SYRINGE | INTRAVENOUS | Status: DC | PRN
  Administered 2022-07-07: 100 mg via INTRAVENOUS

## 2022-07-07 MED ORDER — CHLORHEXIDINE GLUCONATE 0.12 % MT SOLN
15.0000 mL | Freq: Once | OROMUCOSAL | Status: AC
  Administered 2022-07-07: 15 mL via OROMUCOSAL

## 2022-07-07 MED ORDER — ONDANSETRON HCL 4 MG/2ML IJ SOLN
4.0000 mg | INTRAMUSCULAR | Status: DC | PRN
Start: 2022-07-07 — End: 2022-07-08
  Administered 2022-07-07: 4 mg via INTRAVENOUS

## 2022-07-07 MED ORDER — SODIUM CHLORIDE 0.9 % IR SOLN
Status: DC | PRN
  Administered 2022-07-07: 1000 mL via INTRAVESICAL

## 2022-07-07 MED ORDER — CEFAZOLIN SODIUM-DEXTROSE 1-4 GM/50ML-% IV SOLN
1.0000 g | Freq: Three times a day (TID) | INTRAVENOUS | Status: AC
  Administered 2022-07-07 – 2022-07-08 (×2): 1 g via INTRAVENOUS
  Filled 2022-07-07 (×2): qty 50

## 2022-07-07 MED ORDER — DEXAMETHASONE SODIUM PHOSPHATE 10 MG/ML IJ SOLN
INTRAMUSCULAR | Status: AC
  Filled 2022-07-07: qty 1

## 2022-07-07 MED ORDER — ROCURONIUM BROMIDE 10 MG/ML (PF) SYRINGE
PREFILLED_SYRINGE | INTRAVENOUS | Status: AC
  Filled 2022-07-07: qty 10

## 2022-07-07 MED ORDER — PHENYLEPHRINE HCL (PRESSORS) 10 MG/ML IV SOLN
INTRAVENOUS | Status: AC
  Filled 2022-07-07: qty 1

## 2022-07-07 MED ORDER — SUGAMMADEX SODIUM 200 MG/2ML IV SOLN
INTRAVENOUS | Status: DC | PRN
  Administered 2022-07-07: 200 mg via INTRAVENOUS

## 2022-07-07 SURGICAL SUPPLY — 70 items
ADH SKN CLS APL DERMABOND .7 (GAUZE/BANDAGES/DRESSINGS) ×2
APL PRP STRL LF DISP 70% ISPRP (MISCELLANEOUS) ×2
APL SWBSTK 6 STRL LF DISP (MISCELLANEOUS) ×2
APPLICATOR COTTON TIP 6 STRL (MISCELLANEOUS) ×3 IMPLANT
APPLICATOR COTTON TIP 6IN STRL (MISCELLANEOUS) ×3
BAG COUNTER SPONGE SURGICOUNT (BAG) IMPLANT
BAG SPNG CNTER NS LX DISP (BAG)
CATH FOLEY 2WAY SLVR 18FR 30CC (CATHETERS) ×4 IMPLANT
CATH ROBINSON RED A/P 16FR (CATHETERS) ×4 IMPLANT
CATH ROBINSON RED A/P 8FR (CATHETERS) ×4 IMPLANT
CATH TIEMANN FOLEY 18FR 5CC (CATHETERS) ×4 IMPLANT
CHLORAPREP W/TINT 26 (MISCELLANEOUS) ×4 IMPLANT
CLIP LIGATING HEM O LOK PURPLE (MISCELLANEOUS) ×8 IMPLANT
COVER SURGICAL LIGHT HANDLE (MISCELLANEOUS) ×4 IMPLANT
COVER TIP SHEARS 8 DVNC (MISCELLANEOUS) ×3 IMPLANT
COVER TIP SHEARS 8MM DA VINCI (MISCELLANEOUS) ×3
CUTTER ECHEON FLEX ENDO 45 340 (ENDOMECHANICALS) ×4 IMPLANT
DERMABOND ADVANCED (GAUZE/BANDAGES/DRESSINGS) ×1
DERMABOND ADVANCED .7 DNX12 (GAUZE/BANDAGES/DRESSINGS) ×3 IMPLANT
DRAIN CHANNEL RND F F (WOUND CARE) IMPLANT
DRAPE ARM DVNC X/XI (DISPOSABLE) ×12 IMPLANT
DRAPE COLUMN DVNC XI (DISPOSABLE) ×3 IMPLANT
DRAPE DA VINCI XI ARM (DISPOSABLE) ×12
DRAPE DA VINCI XI COLUMN (DISPOSABLE) ×3
DRAPE SURG IRRIG POUCH 19X23 (DRAPES) ×4 IMPLANT
DRSG TEGADERM 4X4.75 (GAUZE/BANDAGES/DRESSINGS) ×4 IMPLANT
ELECT PENCIL ROCKER SW 15FT (MISCELLANEOUS) ×4 IMPLANT
ELECT REM PT RETURN 15FT ADLT (MISCELLANEOUS) ×4 IMPLANT
GAUZE 4X4 16PLY ~~LOC~~+RFID DBL (SPONGE) ×4 IMPLANT
GAUZE SPONGE 4X4 12PLY STRL (GAUZE/BANDAGES/DRESSINGS) ×4 IMPLANT
GLOVE BIO SURGEON STRL SZ 6.5 (GLOVE) ×4 IMPLANT
GLOVE SURG LX 7.5 STRW (GLOVE) ×2
GLOVE SURG LX STRL 7.5 STRW (GLOVE) ×6 IMPLANT
GOWN SRG XL LVL 4 BRTHBL STRL (GOWNS) ×3 IMPLANT
GOWN STRL NON-REIN XL LVL4 (GOWNS) ×3
GOWN STRL REUS W/ TWL XL LVL3 (GOWN DISPOSABLE) ×6 IMPLANT
GOWN STRL REUS W/TWL XL LVL3 (GOWN DISPOSABLE) ×6
HOLDER FOLEY CATH W/STRAP (MISCELLANEOUS) ×4 IMPLANT
IRRIG SUCT STRYKERFLOW 2 WTIP (MISCELLANEOUS) ×3
IRRIGATION SUCT STRKRFLW 2 WTP (MISCELLANEOUS) ×3 IMPLANT
IV LACTATED RINGERS 1000ML (IV SOLUTION) ×4 IMPLANT
KIT TURNOVER KIT A (KITS) IMPLANT
NDL SAFETY ECLIPSE 18X1.5 (NEEDLE) ×3 IMPLANT
NEEDLE HYPO 18GX1.5 SHARP (NEEDLE) ×3
PACK ROBOT UROLOGY CUSTOM (CUSTOM PROCEDURE TRAY) ×4 IMPLANT
RELOAD STAPLE 45 4.1 GRN THCK (STAPLE) ×3 IMPLANT
SEAL CANN UNIV 5-8 DVNC XI (MISCELLANEOUS) ×12 IMPLANT
SEAL XI 5MM-8MM UNIVERSAL (MISCELLANEOUS) ×12
SET TUBE SMOKE EVAC HIGH FLOW (TUBING) ×4 IMPLANT
SOLUTION ELECTROLUBE (MISCELLANEOUS) ×4 IMPLANT
SPIKE FLUID TRANSFER (MISCELLANEOUS) ×4 IMPLANT
STAPLE RELOAD 45 GRN (STAPLE) ×2 IMPLANT
STAPLE RELOAD 45MM GREEN (STAPLE) ×3
SUT ETHILON 3 0 PS 1 (SUTURE) ×4 IMPLANT
SUT MNCRL 3 0 RB1 (SUTURE) ×3 IMPLANT
SUT MNCRL 3 0 VIOLET RB1 (SUTURE) ×3 IMPLANT
SUT MNCRL AB 4-0 PS2 18 (SUTURE) ×8 IMPLANT
SUT MONOCRYL 3 0 RB1 (SUTURE) ×6
SUT PDS PLUS 0 (SUTURE) ×6
SUT PDS PLUS AB 0 CT-2 (SUTURE) ×6 IMPLANT
SUT VIC AB 0 CT1 27 (SUTURE) ×6
SUT VIC AB 0 CT1 27XBRD ANTBC (SUTURE) ×6 IMPLANT
SUT VIC AB 2-0 SH 27 (SUTURE) ×3
SUT VIC AB 2-0 SH 27X BRD (SUTURE) ×3 IMPLANT
SYR 27GX1/2 1ML LL SAFETY (SYRINGE) ×4 IMPLANT
SYS BAG RETRIEVAL 10MM (BASKET) ×3
SYSTEM BAG RETRIEVAL 10MM (BASKET) IMPLANT
TOWEL OR NON WOVEN STRL DISP B (DISPOSABLE) ×4 IMPLANT
TROCAR Z-THREAD FIOS 5X100MM (TROCAR) IMPLANT
WATER STERILE IRR 1000ML POUR (IV SOLUTION) ×4 IMPLANT

## 2022-07-07 NOTE — Plan of Care (Signed)
  Problem: Education: Goal: Knowledge of General Education information will improve Description: Including pain rating scale, medication(s)/side effects and non-pharmacologic comfort measures Outcome: Completed/Met   Problem: Activity: Goal: Risk for activity intolerance will decrease Outcome: Progressing   

## 2022-07-07 NOTE — Progress Notes (Signed)
  Transition of Care [facility]) Screening Note   Patient Details  Name: [name] Date of Birth: 05/05/1961   Transition of Care [facility]) CM/SW Contact:    Dessa Phi, RN Phone Number: 07/07/2022, 1:25 PM    Transition of Care Department [facility]) has reviewed patient and no TOC needs have been identified at this time. We will continue to monitor patient advancement through interdisciplinary progression rounds. If new patient transition needs arise, please place a TOC consult.

## 2022-07-07 NOTE — Anesthesia Procedure Notes (Signed)
Procedure Name: Intubation Date/Time: 07/07/2022 7:31 AM  Performed by: Raenette Rover, CRNAPre-anesthesia Checklist: Patient identified, Emergency Drugs available, Suction available and Patient being monitored Patient Re-evaluated:Patient Re-evaluated prior to induction Oxygen Delivery Method: Circle system utilized Preoxygenation: Pre-oxygenation with 100% oxygen Induction Type: IV induction Ventilation: Mask ventilation without difficulty and Oral airway inserted - appropriate to patient size Laryngoscope Size: Mac and 4 Grade View: Grade I Tube type: Oral Tube size: 7.5 mm Number of attempts: 1 Airway Equipment and Method: Stylet and Oral airway Placement Confirmation: ETT inserted through vocal cords under direct vision, positive ETCO2 and breath sounds checked- equal and bilateral Secured at: 22 cm Tube secured with: Tape Dental Injury: Teeth and Oropharynx as per pre-operative assessment

## 2022-07-07 NOTE — Plan of Care (Signed)
  Problem: Education: Goal: Knowledge of General Education information will improve Description: Including pain rating scale, medication(s)/side effects and non-pharmacologic comfort measures Outcome: Completed/Met   Problem: Pain Management: Goal: General experience of comfort will improve Outcome: Progressing   Problem: Education: Goal: Knowledge of the procedure and recovery process will improve Outcome: Progressing   Problem: Activity: Goal: Risk for activity intolerance will decrease Outcome: Progressing

## 2022-07-07 NOTE — Anesthesia Postprocedure Evaluation (Signed)
Anesthesia Post Note  Patient: Delmar Arriaga  Procedure(s) Performed: XI ROBOTIC ASSISTED LAPAROSCOPIC RADICAL PROSTATECTOMY LEVEL 2 LYMPHADENECTOMY, PELVIC (Bilateral)     Patient location during evaluation: PACU Anesthesia Type: General Level of consciousness: awake and alert Pain management: pain level controlled Vital Signs Assessment: post-procedure vital signs reviewed and stable Respiratory status: spontaneous breathing, nonlabored ventilation, respiratory function stable and patient connected to nasal cannula oxygen Cardiovascular status: blood pressure returned to baseline and stable Postop Assessment: no apparent nausea or vomiting Anesthetic complications: no   No notable events documented.  Last Vitals:  Vitals:   07/07/22 1215 07/07/22 1233  BP: (!) 143/79 (!) 144/75  Pulse: 83 85  Resp: 14 (!) 22  Temp: 36.6 C 36.9 C  SpO2: 95% 92%    Last Pain:  Vitals:   07/07/22 1327  TempSrc:   PainSc: Asleep                 Gordana Kewley L Kayron Hicklin

## 2022-07-07 NOTE — Progress Notes (Signed)
Patient ID: [name], male   DOB: 1961-10-13, 61 y.o.   MRN: [id number]  Post-op note  Subjective: The patient is doing well.  Complains of bladder spasms but doing well overall.  Objective: Vital signs in last 24 hours: Temp:  [97.8 F (36.6 C)-98.4 F (36.9 C)] 98.4 F (36.9 C) (08/03 1233) Pulse Rate:  [83-95] 85 (08/03 1233) Resp:  [14-22] 22 (08/03 1233) BP: (143-162)/(65-86) 144/75 (08/03 1233) SpO2:  [92 %-97 %] 92 % (08/03 1233) Weight:  [115.7 kg] 115.7 kg (08/03 0604)  Intake/Output from previous day: No intake/output data recorded. Intake/Output this shift: Total I/O In: 1200 [I.V.:1200] Out: 475 [Urine:350; Drains:25; Blood:100]  Physical Exam:  General: Alert and oriented. Abdomen: Soft, Nondistended. Incisions: Clean and dry. GU: Urine clear.  Lab Results: Recent Labs    07/07/22 1205  HGB 15.2  HCT 46.6    Assessment/Plan: POD#0   1) Continue to monitor, ambulate, IS, Levsin for bladder spasms prn   [name]. MD   LOS: 0 days   [name] 07/07/2022, 3:10 PM

## 2022-07-07 NOTE — Discharge Instructions (Signed)

## 2022-07-07 NOTE — Transfer of Care (Signed)
Immediate Anesthesia Transfer of Care Note  Patient: [name]  Procedure(s) Performed: XI ROBOTIC ASSISTED LAPAROSCOPIC RADICAL PROSTATECTOMY LEVEL 2 LYMPHADENECTOMY, PELVIC (Bilateral)  Patient Location: PACU  Anesthesia Type:General  Level of Consciousness: awake, drowsy and patient cooperative  Airway & Oxygen Therapy: Patient Spontanous Breathing and Patient connected to face mask oxygen  Post-op Assessment: Report given to RN and Post -op Vital signs reviewed and stable  Post vital signs: Reviewed and stable  Last Vitals:  Vitals Value Taken Time  BP 157/86 07/07/22 1120  Temp    Pulse 97 07/07/22 1123  Resp 17 07/07/22 1123  SpO2 94 % 07/07/22 1123  Vitals shown include unvalidated device data.  Last Pain:  Vitals:   07/07/22 0604  TempSrc:   PainSc: 0-No pain         Complications: No notable events documented.

## 2022-07-07 NOTE — Op Note (Signed)
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 M.D.  Assistant: Debbrah Alar, PA-C  An assistant was required for this surgical procedure.  The duties of the assistant included but were not limited to suctioning, passing suture, camera manipulation, retraction. This procedure would not be able to be performed without an Environmental consultant.  Resident: Dr. Hinton Rao  Anesthesia: General  Complications: None  EBL: 50 mL  IVF:  1000 mL crystalloid  Specimens: Prostate and seminal vesicles Right pelvic lymph nodes Left pelvic lymph nodes  Disposition of specimens: Pathology  Drains: 20 Fr coude catheter # 19 Blake pelvic drain  Indication: [name] is a 61 y.o. year old patient with clinically localized prostate cancer.  After a thorough review of the management options for treatment of prostate cancer, he elected to proceed with surgical therapy and the above procedure(s).  We have discussed the potential benefits and risks of the procedure, side effects of the proposed treatment, the likelihood of the patient achieving the goals of the procedure, and any potential problems that might occur during the procedure or recuperation. Informed consent has been obtained.  Description of procedure:  The patient was taken to the operating room and a general anesthetic was administered. He was given preoperative antibiotics, placed in the dorsal lithotomy position, and prepped and draped in the usual sterile fashion. Next a preoperative timeout was performed. A urethral catheter was placed into the bladder and a site was selected near the umbilicus for  placement of the camera port. This was placed using a standard open Hassan technique which allowed entry into the peritoneal cavity under direct vision and without difficulty. An 8 mm robotic port was placed and a pneumoperitoneum established. The camera was then used to inspect the abdomen and there was no evidence of any intra-abdominal injuries or other abnormalities. The remaining abdominal ports were then placed. 8 mm robotic ports were placed in the right lower quadrant, left lower quadrant, and far left lateral abdominal wall. A 5 mm port was placed in the right upper quadrant and a 12 mm port was placed in the right lateral abdominal wall for laparoscopic assistance. All ports were placed under direct vision without difficulty. The surgical cart was then docked.   Utilizing the cautery scissors, the bladder was reflected posteriorly allowing entry into the space of Retzius and identification of the endopelvic fascia and prostate. The periprostatic fat was then removed from the prostate allowing full exposure of the endopelvic fascia. The endopelvic fascia was then incised from the apex back to the base of the prostate bilaterally and the underlying levator muscle fibers were swept laterally off the prostate thereby isolating the dorsal venous complex. The dorsal vein was then stapled and divided with a 45 mm Flex Echelon stapler. Attention then turned to the bladder neck which was divided anteriorly thereby allowing entry into the bladder and exposure of the urethral catheter. The catheter balloon was deflated and the catheter was brought into the operative field and used to retract the prostate anteriorly. The posterior bladder neck was then examined and was divided allowing further dissection between the bladder and prostate posteriorly until the vasa deferentia and seminal vessels were identified. The vasa deferentia were isolated, divided, and lifted anteriorly. The seminal vesicles  were dissected down  to their tips with care to control the seminal vascular arterial blood supply. These structures were then lifted anteriorly and the space between Denonvillier's fascia and the anterior rectum was developed with a combination of sharp and blunt dissection. This isolated the vascular pedicles of the prostate.  The lateral prostatic fascia was then sharply incised allowing release of the neurovascular bundles bilaterally. The vascular pedicles of the prostate were then ligated with Weck clips between the prostate and neurovascular bundles and divided with sharp cold scissor dissection resulting in neurovascular bundle preservation. The neurovascular bundles were then separated off the apex of the prostate and urethra bilaterally.  The urethra was then sharply transected allowing the prostate specimen to be disarticulated. The pelvis was copiously irrigated and hemostasis was ensured. There was no evidence for rectal injury.  Attention then turned to the right pelvic sidewall. The fibrofatty tissue between the external iliac vein, confluence of the iliac vessels, hypogastric artery, and Cooper's ligament was dissected free from the pelvic sidewall with care to preserve the obturator nerve. Weck clips were used for lymphostasis and hemostasis. An identical procedure was performed on the contralateral side and the lymphatic packets were removed for permanent pathologic analysis.  Attention then turned to the urethral anastomosis. A 2-0 Vicryl slip knot was placed between Denonvillier's fascia, the posterior bladder neck, and the posterior urethra to reapproximate these structures. A double-armed 3-0 Monocryl suture was then used to perform a 360 running tension-free anastomosis between the bladder neck and urethra. A new urethral catheter was then placed into the bladder and irrigated. There were no blood clots within the bladder and the anastomosis appeared to be watertight. A #19 Blake drain was then brought  through the left lateral 8 mm port site and positioned appropriately within the pelvis. It was secured to the skin with a nylon suture. The surgical cart was then undocked. The right lateral 12 mm port site was closed at the fascial level with a 0 Vicryl suture placed laparoscopically. All remaining ports were then removed under direct vision. The prostate specimen was removed intact within the Endopouch retrieval bag via the periumbilical camera port site. This fascial opening was closed with two running 0 PDS sutures. 0.25% Marcaine was then injected into all port sites and all incisions were reapproximated at the skin level with 4-0 Monocryl subcuticular sutures and Dermabond. The patient appeared to tolerate the procedure well and without complications. The patient was able to be extubated and transferred to the recovery unit in satisfactory condition.   [name] MD

## 2022-07-07 NOTE — Interval H&P Note (Signed)
History and Physical Interval Note:  07/07/2022 6:39 AM  [name]  has presented today for surgery, with the diagnosis of PROSTATE CANCER.  The various methods of treatment have been discussed with the patient and family. After consideration of risks, benefits and other options for treatment, the patient has consented to  Procedure(s): XI ROBOTIC ASSISTED LAPAROSCOPIC RADICAL PROSTATECTOMY LEVEL 2 (N/A) LYMPHADENECTOMY, PELVIC (Bilateral) as a surgical intervention.  The patient's history has been reviewed, patient examined, no change in status, stable for surgery.  I have reviewed the patient's chart and labs.  Questions were answered to the patient's satisfaction.     [employer]

## 2022-07-08 ENCOUNTER — Encounter (HOSPITAL_COMMUNITY): Payer: Self-pay | Admitting: Urology

## 2022-07-08 DIAGNOSIS — F17210 Nicotine dependence, cigarettes, uncomplicated: Secondary | ICD-10-CM | POA: Diagnosis not present

## 2022-07-08 DIAGNOSIS — E119 Type 2 diabetes mellitus without complications: Secondary | ICD-10-CM | POA: Diagnosis not present

## 2022-07-08 DIAGNOSIS — Z79899 Other long term (current) drug therapy: Secondary | ICD-10-CM | POA: Diagnosis not present

## 2022-07-08 DIAGNOSIS — C61 Malignant neoplasm of prostate: Secondary | ICD-10-CM | POA: Diagnosis not present

## 2022-07-08 DIAGNOSIS — I10 Essential (primary) hypertension: Secondary | ICD-10-CM | POA: Diagnosis not present

## 2022-07-08 LAB — BASIC METABOLIC PANEL
Anion gap: 7 (ref 5–15)
BUN: 12 mg/dL (ref 8–23)
CO2: 24 mmol/L (ref 22–32)
Calcium: 8.6 mg/dL — ABNORMAL LOW (ref 8.9–10.3)
Chloride: 107 mmol/L (ref 98–111)
Creatinine, Ser: 0.81 mg/dL (ref 0.61–1.24)
GFR, Estimated: >60 mL/min (ref >60)
Glucose, Bld: 107 mg/dL — ABNORMAL HIGH (ref 70–99)
Potassium: 3.8 mmol/L (ref 3.5–5.1)
Sodium: 138 mmol/L (ref 135–145)

## 2022-07-08 LAB — GLUCOSE, CAPILLARY
Glucose-Capillary: 108 mg/dL — ABNORMAL HIGH (ref 70–99)
Glucose-Capillary: 111 mg/dL — ABNORMAL HIGH (ref 70–99)
Glucose-Capillary: 123 mg/dL — ABNORMAL HIGH (ref 70–99)
Glucose-Capillary: 137 mg/dL — ABNORMAL HIGH (ref 70–99)

## 2022-07-08 LAB — HEMOGLOBIN AND HEMATOCRIT, BLOOD
HCT: 40.7 % (ref 39.0–52.0)
Hemoglobin: 13.6 g/dL (ref 13.0–17.0)

## 2022-07-08 MED ORDER — TRAMADOL HCL 50 MG PO TABS: 50.0000 mg | ORAL_TABLET | Freq: Four times a day (QID) | ORAL | Status: DC | PRN

## 2022-07-08 MED ORDER — BISACODYL 10 MG RE SUPP
10.0000 mg | Freq: Once | RECTAL | Status: AC
  Administered 2022-07-08: 10 mg via RECTAL
  Filled 2022-07-08: qty 1

## 2022-07-08 MED ORDER — BISACODYL 10 MG RE SUPP
10.0000 mg | Freq: Once | RECTAL | Status: AC
  Administered 2022-07-08: 10 mg via RECTAL

## 2022-07-08 NOTE — Progress Notes (Signed)
Patient ID: [name], male   DOB: March 01, 1961, 61 y.o.   MRN: [id number]  1 Day Post-Op Subjective: The patient is doing well.  No nausea or vomiting. Pain is adequately controlled.  Objective: Vital signs in last 24 hours: Temp:  [97.5 F (36.4 C)-98.8 F (37.1 C)] 97.9 F (36.6 C) (08/04 0459) Pulse Rate:  [82-95] 82 (08/04 0459) Resp:  [14-22] 18 (08/04 0459) BP: (125-162)/(62-86) 125/62 (08/04 0459) SpO2:  [92 %-96 %] 95 % (08/04 0637)  Intake/Output from previous day: 08/03 0701 - 08/04 0700 In: 4709.7 [P.O.:240; I.V.:3448; IV Piggyback:1021.7] Out: 5436 [Urine:3475; Drains:70; Blood:100] Intake/Output this shift: No intake/output data recorded.  Physical Exam:  General: Alert and oriented. CV: RRR Lungs: Clear bilaterally. GI: Soft, Nondistended. Incisions: Clean, dry, and intact Urine: Clear Extremities: Nontender, no erythema, no edema.  Lab Results: Recent Labs    07/07/22 1205 07/08/22 0401  HGB 15.2 13.6  HCT 46.6 40.7      Assessment/Plan: POD# 1 s/p robotic prostatectomy.  1) SL IVF 2) Ambulate, Incentive spirometry 3) Transition to oral pain medication 4) Dulcolax suppository 5) D/C pelvic drain 6) Plan for likely discharge later today   [name]. MD   LOS: 0 days   [name] 07/08/2022, 8:12 AM

## 2022-07-08 NOTE — Discharge Summary (Signed)
[facility] Discharge Summary  Admit date: 07/07/2022  Discharge date and time: 07/08/22   Discharge to: Home  Discharge Service: Urology  Discharge Attending Physician:  Dr. Raynelle Bring  Discharge  Diagnoses: Prostate cancer [facility])  Secondary Diagnosis: Principal Problem:   Prostate cancer ([place])   OR Procedures: Procedure(s): XI ROBOTIC ASSISTED LAPAROSCOPIC RADICAL PROSTATECTOMY LEVEL 2 LYMPHADENECTOMY, PELVIC 07/07/2022   Ancillary Procedures: None   Discharge Day Services: The patient was seen and examined by the Urology team both in the morning and immediately prior to discharge.  Vital signs and laboratory values were stable and within normal limits.  The physical exam was benign and unchanged and all surgical wounds were examined.  Discharge instructions were explained and all questions answered.  Subjective  No acute events overnight. Pain Controlled. No fever or chills.  Objective Patient Vitals for the past 8 hrs:  BP Temp Pulse Resp SpO2  07/08/22 1236 (!) 145/84 97.9 F (36.6 C) 82 18 98 %  07/08/22 0637 -- -- -- -- 95 %   Total I/O In: -  Out: 1000 [Urine:1000]  General Appearance:        No acute distress Lungs:                       Normal work of breathing on room air Heart:                                Regular rate and rhythm Abdomen:                         Soft, non-tender, non-distended. Incision clean/dry/intact. Catheter draining clear yellow urine Extremities:                      Warm and well perfused   Hospital Course:  The patient underwent robotic assisted radical prostatectomy and bilateral pelvic lymph node dissection on 07/07/2022.  The patient tolerated the procedure well, was extubated in the OR, and afterwards was taken to the PACU for routine post-surgical care. When stable the patient was transferred to the floor.   The patient did well postoperatively.  The patient's diet was slowly advanced and at the time of discharge was  tolerating a regular diet.  The patient was discharged home 1 Day Post-Op, at which point was tolerating a regular solid diet, was able to void spontaneously, have adequate pain control with P.O. pain medication, and could ambulate without difficulty. The patient will follow up with [place] for post op check and catheter removal in approximately 1 week  Condition at Discharge: Improved  Discharge Medications:  Allergies as of 07/08/2022   No Known Allergies      Medication List     STOP taking these medications    ascorbic acid 500 MG tablet Commonly known as: VITAMIN C   b complex vitamins capsule       TAKE these medications    acetaminophen 500 MG tablet Commonly known as: TYLENOL Take 1,500-2,000 mg by mouth every 6 (six) hours as needed for headache or moderate pain.   amLODipine 5 MG tablet Commonly known as: NORVASC Take 5 mg by mouth every evening.   docusate sodium 100 MG capsule Commonly known as: COLACE Take 1 capsule (100 mg total) by mouth 2 (two) times daily.   losartan 100 MG tablet Commonly known as: COZAAR Take 100  mg by mouth every evening.   metFORMIN 500 MG tablet Commonly known as: GLUCOPHAGE Take 500 mg by mouth 2 (two) times daily with a meal.   pantoprazole 40 MG tablet Commonly known as: PROTONIX Take 40 mg by mouth every evening.   rosuvastatin 5 MG tablet Commonly known as: CRESTOR Take 5 mg by mouth every evening.   sulfamethoxazole-trimethoprim 800-160 MG tablet Commonly known as: BACTRIM DS Take 1 tablet by mouth 2 (two) times daily. Start the day prior to foley removal appointment   traMADol 50 MG tablet Commonly known as: Ultram Take 1-2 tablets (50-100 mg total) by mouth every 6 (six) hours as needed for moderate pain or severe pain.

## 2022-07-12 LAB — SURGICAL PATHOLOGY

## 2022-07-14 ENCOUNTER — Telehealth: Payer: Self-pay | Admitting: Hematology and Oncology

## 2022-07-14 ENCOUNTER — Inpatient Hospital Stay (HOSPITAL_BASED_OUTPATIENT_CLINIC_OR_DEPARTMENT_OTHER): Payer: BC Managed Care – PPO | Admitting: Hematology and Oncology

## 2022-07-14 ENCOUNTER — Telehealth: Payer: Self-pay | Admitting: *Deleted

## 2022-07-14 ENCOUNTER — Other Ambulatory Visit: Payer: Self-pay | Admitting: Hematology and Oncology

## 2022-07-14 ENCOUNTER — Inpatient Hospital Stay: Payer: BC Managed Care – PPO | Attending: Hematology and Oncology

## 2022-07-14 ENCOUNTER — Other Ambulatory Visit: Payer: Self-pay

## 2022-07-14 VITALS — BP 134/81 | HR 91 | Temp 98.1°F | Resp 16 | Wt 257.3 lb

## 2022-07-14 DIAGNOSIS — C8121 Mixed cellularity classical Hodgkin lymphoma, lymph nodes of head, face, and neck: Secondary | ICD-10-CM | POA: Diagnosis not present

## 2022-07-14 DIAGNOSIS — Z8546 Personal history of malignant neoplasm of prostate: Secondary | ICD-10-CM | POA: Diagnosis not present

## 2022-07-14 DIAGNOSIS — Z79899 Other long term (current) drug therapy: Secondary | ICD-10-CM | POA: Diagnosis not present

## 2022-07-14 LAB — CBC WITH DIFFERENTIAL (CANCER CENTER ONLY)
Abs Immature Granulocytes: 0.02 10*3/uL (ref 0.00–0.07)
Basophils Absolute: 0.1 10*3/uL (ref 0.0–0.1)
Basophils Relative: 1 %
Eosinophils Absolute: 0.4 10*3/uL (ref 0.0–0.5)
Eosinophils Relative: 5 %
HCT: 43.3 % (ref 39.0–52.0)
Hemoglobin: 14.8 g/dL (ref 13.0–17.0)
Immature Granulocytes: 0 %
Lymphocytes Relative: 19 %
Lymphs Abs: 1.6 10*3/uL (ref 0.7–4.0)
MCH: 29.6 pg (ref 26.0–34.0)
MCHC: 34.2 g/dL (ref 30.0–36.0)
MCV: 86.6 fL (ref 80.0–100.0)
Monocytes Absolute: 0.7 10*3/uL (ref 0.1–1.0)
Monocytes Relative: 8 %
Neutro Abs: 5.7 10*3/uL (ref 1.7–7.7)
Neutrophils Relative %: 67 %
Platelet Count: 234 10*3/uL (ref 150–400)
RBC: 5 MIL/uL (ref 4.22–5.81)
RDW: 13.3 % (ref 11.5–15.5)
WBC Count: 8.4 10*3/uL (ref 4.0–10.5)
nRBC: 0 % (ref 0.0–0.2)

## 2022-07-14 LAB — CMP (CANCER CENTER ONLY)
ALT: 18 U/L (ref 0–44)
AST: 14 U/L — ABNORMAL LOW (ref 15–41)
Albumin: 4.3 g/dL (ref 3.5–5.0)
Alkaline Phosphatase: 59 U/L (ref 38–126)
Anion gap: 8 (ref 5–15)
BUN: 15 mg/dL (ref 8–23)
CO2: 25 mmol/L (ref 22–32)
Calcium: 9.5 mg/dL (ref 8.9–10.3)
Chloride: 106 mmol/L (ref 98–111)
Creatinine: 0.99 mg/dL (ref 0.61–1.24)
GFR, Estimated: >60 mL/min (ref >60)
Glucose, Bld: 148 mg/dL — ABNORMAL HIGH (ref 70–99)
Potassium: 3.9 mmol/L (ref 3.5–5.1)
Sodium: 139 mmol/L (ref 135–145)
Total Bilirubin: 0.5 mg/dL (ref 0.3–1.2)
Total Protein: 7.9 g/dL (ref 6.5–8.1)

## 2022-07-14 LAB — LACTATE DEHYDROGENASE: LDH: 118 U/L (ref 98–192)

## 2022-07-14 NOTE — Telephone Encounter (Signed)
[name] regarding his appt today. Spoke to him. Advised that I knew he had surgery (prostatectomy)  last week and asked if he wanted to postpone his visit with Dr. Lorenso Courier today. He states he is very sore but will be able to come in today. He cannot drive at this time so his sister will be bringing him.

## 2022-07-14 NOTE — Telephone Encounter (Signed)
Per 8/10 los called and spoke to pt about appointment   pt confirmed appointment

## 2022-07-14 NOTE — Progress Notes (Signed)
[place] [phone]   [phone]  PROGRESS NOTE  Patient Care Team: London Pepper, MD as PCP - General (Family Medicine)  Hematological/Oncological History # Classical Hodgkin's Lymphoma, Mixed Cellularity. 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 2) 10/27/2020: resection of neck mass. Pathology showed classical Hodgkin's lymphoma, mixed cellularity subtype 3) 11/13/2020: establish care with Dr. Lorenso Courier  4) 11/26/2020: PET CT scan shows single hypermetabolic unenlarged right level II cervical lymph node identified on today's study. FDG uptake is compatible with [place] 4. Other small bilateral cervical and upper normal to borderline hepatoduodenal ligament lymph nodes show De[place]ville 2 uptake levels 5) 12/18/2020: Cycle 1 Day 1 of AVD chemotherapy  6) 01/15/2021: Cycle 2 Day 1 of AVD chemotherapy  7) 02/12/2021: PET CT scan shows no evidence of residual disease 8) 02/15/2021: Cycle 3 Day 1 of AVD chemotherapy  9) 03/01/2021: Cycle 3 Day 15 of AVD chemotherapy  10) 03/15/2021: Cycle 4 Day 1 of AVD chemotherapy  11) 03/29/2021: Cycle 4 Day 15 of AVD chemotherapy 12) 04/12/2021: Cycle 5 Day 1 of AVD chemotherapy 13) 04/26/2021: Cycle 5 Day 15 of AVD chemotherapy 14) 05/10/2021: Cycle 6 Day 1 of AVD chemotherapy 15) 05/24/2021: Cycle 6 Day 15 of AVD chemotherapy 16) 06/21/2021: PET CT scan shows no evidence of residual/recurrent lymphoma. Patient in complete remission, enter surviellance.   Interval History:  [name] 61 y.o. male with medical history significant for Classical Hodgkin's lymphoma Early Stage/Unfavorable risk who presents for a follow up visit. The patient's last visit was on 5/32023. In the interim since the last visit he underwent prostatectomy 02/21/2022 with urology.  On exam today [name] reports he is making a good recovery from  surgery.  He reports overall surgery with a "best case scenario".  He notes that he is ambulatory he is having some abdominal discomfort at the sites of the laparoscopic arm incisions.  He reports that he is eating well and is not having any issues with nausea,, or diarrhea.  He notes that he had his catheter removed yesterday and he still having trouble with bladder control.  He is wearing depends to help with this.  He notes will be out of work for the next 2 and half months.  Regarding his weight, he has not noticed any fevers, chills, sweats, nausea, vomiting or diarrhea.  He is not having any issues with lymphadenopathy.  His weight has been steady.   He has no other complaints. A full 10 point ROS is listed below.  MEDICAL HISTORY:  Past Medical History:  Diagnosis Date   Cancer ([place]) 09/2014   recent dx prostate ca-no tx yet   Diabetes mellitus without complication ([place])    GERD (gastroesophageal reflux disease)    Hodgkin's lymphoma ([place]) 11/2020   Hypertension    Pneumonia    history   Sleep apnea    wears CPAP   Teeth decayed     SURGICAL HISTORY: Past Surgical History:  Procedure Laterality Date   IR IMAGING GUIDED PORT INSERTION  12/11/2020   IR REMOVAL TUN ACCESS W/ PORT W/O FL MOD SED  07/22/2021   LYMPHADENECTOMY Bilateral 07/07/2022   Procedure: LYMPHADENECTOMY, PELVIC;  Surgeon: Raynelle Bring, MD;  Location: [facility];  Service: Urology;  Laterality: Bilateral;   MASS BIOPSY Left 09/29/2014   Procedure: LEFT LOWER NECK MASS EXCISION;  Surgeon: Izora Gala, MD;  Location: [facility];  Service: ENT;  Laterality: Left;   PAROTIDECTOMY Left 09/29/2014   Procedure: LEFT PAROTIDECTOMY;  Surgeon: Izora Gala, MD;  Location: [facility];  Service: ENT;  Laterality: Left;   ROBOT ASSISTED LAPAROSCOPIC RADICAL PROSTATECTOMY N/A 07/07/2022   Procedure: XI ROBOTIC ASSISTED LAPAROSCOPIC RADICAL PROSTATECTOMY LEVEL 2;  Surgeon: Raynelle Bring, MD;  Location: [facility];  Service: Urology;  Laterality: N/A;   SEPTOPLASTY  1976    SOCIAL HISTORY: Social History   Socioeconomic History   Marital status: Widowed    Spouse name: Not on file   Number of children: Not on file   Years of education: Not on file   Highest education level: Not on file  Occupational History   Not on file  Tobacco Use   Smoking status: Former    Packs/day: 3.00    Years: 32.00    Total pack years: 96.00    Types: Cigarettes    Quit date: 11/26/2017    Years since quitting: 4.6   Smokeless tobacco: Never  Vaping Use   Vaping Use: Never used  Substance and Sexual Activity   Alcohol use: Yes    Comment: twice a year   Drug use: No   Sexual activity: Yes  Other Topics Concern   Not on file  Social History Narrative   Not on file   Social Determinants of Health   Financial Resource Strain: Not on file  Food Insecurity: Not on file  Transportation Needs: Not on file  Physical Activity: Not on file  Stress: Not on file  Social Connections: Not on file  Intimate Partner Violence: Not on file    FAMILY HISTORY: Family History  Problem Relation Age of Onset   Stroke Mother    Heart disease Father     ALLERGIES:  has No Known Allergies.  MEDICATIONS:  Current Outpatient Medications  Medication Sig Dispense Refill   acetaminophen (TYLENOL) 500 MG tablet Take 1,500-2,000 mg by mouth every 6 (six) hours as needed for headache or moderate pain.     amLODipine (NORVASC) 5 MG tablet Take 5 mg by mouth every evening.     docusate sodium (COLACE) 100 MG capsule Take 1 capsule (100 mg total) by mouth 2 (two) times daily.     losartan (COZAAR) 100 MG tablet Take 100 mg by mouth every evening.     metFORMIN (GLUCOPHAGE) 500 MG tablet Take 500 mg by mouth 2 (two) times daily with a meal.     pantoprazole (PROTONIX) 40 MG tablet Take 40 mg by mouth every evening.     rosuvastatin (CRESTOR) 5 MG tablet Take 5 mg by mouth every evening.      sulfamethoxazole-trimethoprim (BACTRIM DS) 800-160 MG tablet Take 1 tablet by mouth 2 (two) times daily. Start the day prior to foley removal appointment 6 tablet 0   traMADol (ULTRAM) 50 MG tablet Take 1-2 tablets (50-100 mg total) by mouth every 6 (six) hours as needed for moderate pain or severe pain. (Patient not taking: Reported on 07/14/2022) 20 tablet 0   No current facility-administered medications for this visit.   Facility-Administered Medications Ordered in Other Visits  Medication Dose Route Frequency Provider Last Rate Last Admin   0.9 %  sodium chloride infusion   Intravenous Continuous Allred, Darrell K, PA-C        REVIEW OF SYSTEMS:   Constitutional: ( - ) fevers, ( - )  chills , ( - ) night sweats Eyes: ( - ) blurriness  of vision, ( - ) double vision, ( - ) watery eyes Ears, nose, mouth, throat, and face: ( - ) mucositis, ( - ) sore throat Respiratory: ( - ) cough, ( - ) dyspnea, ( - ) wheezes Cardiovascular: ( - ) palpitation, ( - ) chest discomfort, ( - ) lower extremity swelling Gastrointestinal:  ( - ) nausea, ( - ) heartburn, ( - ) change in bowel habits Skin: ( - ) abnormal skin rashes Lymphatics: ( - ) new lymphadenopathy, ( - ) easy bruising Neurological: ( - ) numbness, ( - ) tingling, ( - ) new weaknesses Behavioral/Psych: ( - ) mood change, ( - ) new changes  All other systems were reviewed with the patient and are negative.  PHYSICAL EXAMINATION: ECOG PERFORMANCE STATUS: 1 - Symptomatic but completely ambulatory  Vitals:   07/14/22 1114  BP: 134/81  Pulse: 91  Resp: 16  Temp: 98.1 F (36.7 C)  SpO2: 98%     Filed Weights   07/14/22 1114  Weight: 257 lb 4.8 oz (116.7 kg)      GENERAL: well appearing middle aged Caucasian male in NAD SKIN: skin color, texture, turgor are normal, no rashes or significant lesions EYES: conjunctiva are pink and non-injected, sclera clear LUNGS: clear to auscultation and percussion with normal breathing  effort HEART: regular rate & rhythm and no murmurs and no lower extremity edema Musculoskeletal: no cyanosis of digits and no clubbing  PSYCH: alert & oriented x 3, fluent speech NEURO: no focal motor/sensory deficits  LABORATORY DATA:  I have reviewed the data as listed    Latest Ref Rng & Units 07/14/2022   10:53 AM 07/08/2022    4:01 AM 07/07/2022   12:05 PM  CBC  WBC 4.0 - 10.5 K/uL 8.4     Hemoglobin 13.0 - 17.0 g/dL 14.8  13.6  15.2   Hematocrit 39.0 - 52.0 % 43.3  40.7  46.6   Platelets 150 - 400 K/uL 234          Latest Ref Rng & Units 07/14/2022   10:53 AM 07/08/2022    4:01 AM 07/01/2022    2:19 PM  CMP  Glucose 70 - 99 mg/dL 148  107  156   BUN 8 - 23 mg/dL '15  12  13   ' Creatinine 0.61 - 1.24 mg/dL 0.99  0.81  0.92   Sodium 135 - 145 mmol/L 139  138  136   Potassium 3.5 - 5.1 mmol/L 3.9  3.8  3.9   Chloride 98 - 111 mmol/L 106  107  107   CO2 22 - 32 mmol/L '25  24  22   ' Calcium 8.9 - 10.3 mg/dL 9.5  8.6  9.3   Total Protein 6.5 - 8.1 g/dL 7.9     Total Bilirubin 0.3 - 1.2 mg/dL 0.5     Alkaline Phos 38 - 126 U/L 59     AST 15 - 41 U/L 14     ALT 0 - 44 U/L 18       No results found for: "MPROTEIN" No results found for: "KPAFRELGTCHN", "LAMBDASER", "KAPLAMBRATIO"   RADIOGRAPHIC STUDIES:  No results found.  ASSESSMENT & PLAN [name] 61 y.o. male with medical history significant for Classical Hodgkin's lymphoma Early Stage/Unfavorable risk who presents for a follow up visit.  After review the labs, the records, review of the PFTs, and review the echocardiogram the findings are most consistent with an early stage/unfavorable risk classical Hodgkin's lymphoma.  Given that he meets unfavorable criteria via multiple guidelines I recommended that we proceed with treatment as recommended in the NCCN guidelines for patients with early stage unfavorable disease (which is the exact same treatment recommended for stage III-IV disease).  Previously we discussed the AVD  chemotherapy regimen.  We discussed the expected side effects including possible hair loss, nausea, vomiting, diarrhea, constipation, cytopenias, neutropenia, cardiac dysfunction, and neurological damage.  The patient will be treated with every 2 week rounds of chemotherapy using this regimen. AVD chemotherapy consists of doxorubicin 25 mg/m on days 1 and 15, vinblastine 6 mg per metered squared IV on days 1 and 15, and dacarbazine 375 mg/m IV on days 1 and 15.  After 2 cycles of AVD chemotherapy, PET/CT scan from 02/12/2021 revealed complete response to therapy with no evidence of lymphoma. The recommendation is to proceed with a further 4 cycles of AVD therapy.   # Classical Hodgkin's Lymphoma, Mixed Cellularity. 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  --findings were consistent with an Early stage unfavorable risk disease (due to age, ESR elevation, and mixed cellularity based on EORTC, GHSG, and ECGO criteria) --his disease appears early stage with one clear site of involvement in the neck. There are some smaller lymph nodes in the neck and one near the hepatoduodenal ligament, though would favor diagnosis of early stage --based on these criteria the recommendation would be for AVD x 2 cycles with interval PET CT scan followed by AVD x 4 cycles.  --interval PET on 06/21/2021 showed complete response to therapy --we avoided bleomycin in this patient due to smoking history, mild abnormalities on PFTs and age Plan: -- Labs (CBC, CMP, LDH) from today were reviewed without any intervention needed.  -- Labs show white blood cell count 8.4, hemoglobin 14.8, MCV 86.6, and platelets of 234 --CT scan q 6 months. Next scan due Nov 2023 --RTC in 3 months   #Supportive Care --chemotherapy education complete --port to be removed on 07/22/2021.   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 No biopsy recommended.  --continue to monitor on future scans.  #Prostate Cancer, Gleason 3+4=7. S/p resection --s/p robotic assisted laparoscopic radical prostatectomy on 07/07/2022.  -- Noted to have benign lymph nodes and Prostatic adenocarcinoma, Gleason score 3+4=7  -- Currently being managed by urology service.  Orders Placed This Encounter  Procedures   CT CHEST ABDOMEN PELVIS W CONTRAST    Standing Status:   Future    Standing Expiration Date:   07/15/2023    Order Specific Question:   Preferred imaging location?    Answer:   [facility]    Order Specific Question:   Is Oral Contrast requested for this exam?    Answer:   Yes, Per Radiology protocol   All questions were answered. The patient knows to call the clinic with any problems, questions or concerns.  I have spent a total of 30 minutes minutes of face-to-face and non-face-to-face time, preparing to see the patient, obtaining and/or reviewing separately obtained history, performing a medically appropriate examination, counseling and educating the patient, ordering tests,documenting clinical information in the electronic health record and care coordination.   Ledell Peoples, MD Department of Hematology/Oncology [place] [facility] Phone: [phone] Pager: [phone] Email: [email or web address]  07/14/2022 11:51 AM

## 2022-07-15 ENCOUNTER — Other Ambulatory Visit: Payer: Self-pay

## 2022-07-17 ENCOUNTER — Other Ambulatory Visit: Payer: Self-pay

## 2022-08-04 DIAGNOSIS — M62838 Other muscle spasm: Secondary | ICD-10-CM | POA: Diagnosis not present

## 2022-08-04 DIAGNOSIS — N393 Stress incontinence (female) (male): Secondary | ICD-10-CM | POA: Diagnosis not present

## 2022-08-04 DIAGNOSIS — M6281 Muscle weakness (generalized): Secondary | ICD-10-CM | POA: Diagnosis not present

## 2022-08-17 DIAGNOSIS — M62838 Other muscle spasm: Secondary | ICD-10-CM | POA: Diagnosis not present

## 2022-08-17 DIAGNOSIS — N393 Stress incontinence (female) (male): Secondary | ICD-10-CM | POA: Diagnosis not present

## 2022-08-17 DIAGNOSIS — M6281 Muscle weakness (generalized): Secondary | ICD-10-CM | POA: Diagnosis not present

## 2022-08-27 DIAGNOSIS — G4733 Obstructive sleep apnea (adult) (pediatric): Secondary | ICD-10-CM | POA: Diagnosis not present

## 2022-09-23 DIAGNOSIS — M62838 Other muscle spasm: Secondary | ICD-10-CM | POA: Diagnosis not present

## 2022-09-23 DIAGNOSIS — N393 Stress incontinence (female) (male): Secondary | ICD-10-CM | POA: Diagnosis not present

## 2022-09-23 DIAGNOSIS — M6281 Muscle weakness (generalized): Secondary | ICD-10-CM | POA: Diagnosis not present

## 2022-09-23 DIAGNOSIS — C61 Malignant neoplasm of prostate: Secondary | ICD-10-CM | POA: Diagnosis not present

## 2022-09-26 DIAGNOSIS — G4733 Obstructive sleep apnea (adult) (pediatric): Secondary | ICD-10-CM | POA: Diagnosis not present

## 2022-10-04 ENCOUNTER — Ambulatory Visit (HOSPITAL_COMMUNITY)
Admission: RE | Admit: 2022-10-04 | Discharge: 2022-10-04 | Disposition: A | Discharge status: Home / Self Care | Payer: BC Managed Care – PPO | Source: Ambulatory Visit | Attending: Hematology and Oncology | Admitting: Hematology and Oncology

## 2022-10-04 DIAGNOSIS — K429 Umbilical hernia without obstruction or gangrene: Secondary | ICD-10-CM | POA: Diagnosis not present

## 2022-10-04 DIAGNOSIS — K4090 Unilateral inguinal hernia, without obstruction or gangrene, not specified as recurrent: Secondary | ICD-10-CM | POA: Diagnosis not present

## 2022-10-04 DIAGNOSIS — C61 Malignant neoplasm of prostate: Secondary | ICD-10-CM | POA: Insufficient documentation

## 2022-10-04 DIAGNOSIS — C8121 Mixed cellularity classical Hodgkin lymphoma, lymph nodes of head, face, and neck: Secondary | ICD-10-CM | POA: Insufficient documentation

## 2022-10-04 DIAGNOSIS — D1800 Hemangioma unspecified site: Secondary | ICD-10-CM | POA: Diagnosis not present

## 2022-10-04 DIAGNOSIS — I700 Atherosclerosis of aorta: Secondary | ICD-10-CM | POA: Diagnosis not present

## 2022-10-04 DIAGNOSIS — Z8546 Personal history of malignant neoplasm of prostate: Secondary | ICD-10-CM | POA: Diagnosis not present

## 2022-10-04 DIAGNOSIS — Z8571 Personal history of Hodgkin lymphoma: Secondary | ICD-10-CM | POA: Diagnosis not present

## 2022-10-04 LAB — POCT I-STAT CREATININE: Creatinine, Ser: 1 mg/dL (ref 0.61–1.24)

## 2022-10-04 MED ORDER — IOHEXOL 9 MG/ML PO SOLN: 1000.0000 mL | ORAL | Status: AC

## 2022-10-04 MED ORDER — SODIUM CHLORIDE (PF) 0.9 % IJ SOLN
INTRAMUSCULAR | Status: AC
  Filled 2022-10-04: qty 50

## 2022-10-04 MED ORDER — IOHEXOL 300 MG/ML  SOLN
100.0000 mL | Freq: Once | INTRAMUSCULAR | Status: AC | PRN
  Administered 2022-10-04: 100 mL via INTRAVENOUS

## 2022-10-04 MED ORDER — IOHEXOL 9 MG/ML PO SOLN
ORAL | Status: AC
  Filled 2022-10-04: qty 1000

## 2022-10-05 ENCOUNTER — Other Ambulatory Visit: Payer: Self-pay | Admitting: Hematology and Oncology

## 2022-10-05 ENCOUNTER — Inpatient Hospital Stay: Payer: BC Managed Care – PPO | Attending: Hematology and Oncology

## 2022-10-05 ENCOUNTER — Inpatient Hospital Stay (HOSPITAL_BASED_OUTPATIENT_CLINIC_OR_DEPARTMENT_OTHER): Payer: BC Managed Care – PPO | Admitting: Hematology and Oncology

## 2022-10-05 ENCOUNTER — Other Ambulatory Visit: Payer: Self-pay

## 2022-10-05 VITALS — BP 145/75 | HR 83 | Temp 97.7°F | Resp 16 | Wt 261.7 lb

## 2022-10-05 DIAGNOSIS — C61 Malignant neoplasm of prostate: Secondary | ICD-10-CM | POA: Diagnosis not present

## 2022-10-05 DIAGNOSIS — Z9079 Acquired absence of other genital organ(s): Secondary | ICD-10-CM | POA: Diagnosis not present

## 2022-10-05 DIAGNOSIS — Z79899 Other long term (current) drug therapy: Secondary | ICD-10-CM | POA: Insufficient documentation

## 2022-10-05 DIAGNOSIS — Z8546 Personal history of malignant neoplasm of prostate: Secondary | ICD-10-CM | POA: Insufficient documentation

## 2022-10-05 DIAGNOSIS — Z87891 Personal history of nicotine dependence: Secondary | ICD-10-CM | POA: Insufficient documentation

## 2022-10-05 DIAGNOSIS — Z8571 Personal history of Hodgkin lymphoma: Secondary | ICD-10-CM | POA: Insufficient documentation

## 2022-10-05 DIAGNOSIS — Z8249 Family history of ischemic heart disease and other diseases of the circulatory system: Secondary | ICD-10-CM | POA: Insufficient documentation

## 2022-10-05 DIAGNOSIS — I700 Atherosclerosis of aorta: Secondary | ICD-10-CM | POA: Diagnosis not present

## 2022-10-05 DIAGNOSIS — Z823 Family history of stroke: Secondary | ICD-10-CM | POA: Insufficient documentation

## 2022-10-05 DIAGNOSIS — C8121 Mixed cellularity classical Hodgkin lymphoma, lymph nodes of head, face, and neck: Secondary | ICD-10-CM

## 2022-10-05 DIAGNOSIS — I10 Essential (primary) hypertension: Secondary | ICD-10-CM | POA: Insufficient documentation

## 2022-10-05 DIAGNOSIS — N393 Stress incontinence (female) (male): Secondary | ICD-10-CM | POA: Diagnosis not present

## 2022-10-05 LAB — CMP (CANCER CENTER ONLY)
ALT: 23 U/L (ref 0–44)
AST: 21 U/L (ref 15–41)
Albumin: 4.1 g/dL (ref 3.5–5.0)
Alkaline Phosphatase: 54 U/L (ref 38–126)
Anion gap: 9 (ref 5–15)
BUN: 13 mg/dL (ref 8–23)
CO2: 25 mmol/L (ref 22–32)
Calcium: 9.5 mg/dL (ref 8.9–10.3)
Chloride: 105 mmol/L (ref 98–111)
Creatinine: 1.01 mg/dL (ref 0.61–1.24)
GFR, Estimated: >60 mL/min (ref >60)
Glucose, Bld: 161 mg/dL — ABNORMAL HIGH (ref 70–99)
Potassium: 3.7 mmol/L (ref 3.5–5.1)
Sodium: 139 mmol/L (ref 135–145)
Total Bilirubin: 0.3 mg/dL (ref 0.3–1.2)
Total Protein: 7.5 g/dL (ref 6.5–8.1)

## 2022-10-05 LAB — CBC WITH DIFFERENTIAL (CANCER CENTER ONLY)
Abs Immature Granulocytes: 0.01 10*3/uL (ref 0.00–0.07)
Basophils Absolute: 0.1 10*3/uL (ref 0.0–0.1)
Basophils Relative: 1 %
Eosinophils Absolute: 0.2 10*3/uL (ref 0.0–0.5)
Eosinophils Relative: 4 %
HCT: 44.1 % (ref 39.0–52.0)
Hemoglobin: 15.2 g/dL (ref 13.0–17.0)
Immature Granulocytes: 0 %
Lymphocytes Relative: 27 %
Lymphs Abs: 1.4 10*3/uL (ref 0.7–4.0)
MCH: 29.1 pg (ref 26.0–34.0)
MCHC: 34.5 g/dL (ref 30.0–36.0)
MCV: 84.5 fL (ref 80.0–100.0)
Monocytes Absolute: 0.5 10*3/uL (ref 0.1–1.0)
Monocytes Relative: 9 %
Neutro Abs: 3.2 10*3/uL (ref 1.7–7.7)
Neutrophils Relative %: 59 %
Platelet Count: 169 10*3/uL (ref 150–400)
RBC: 5.22 MIL/uL (ref 4.22–5.81)
RDW: 13.2 % (ref 11.5–15.5)
WBC Count: 5.3 10*3/uL (ref 4.0–10.5)
nRBC: 0 % (ref 0.0–0.2)

## 2022-10-05 LAB — LACTATE DEHYDROGENASE: LDH: 113 U/L (ref 98–192)

## 2022-10-05 NOTE — Progress Notes (Signed)
[place] [phone]   [phone]  PROGRESS NOTE  Patient Care Team: London Pepper, MD as PCP - General (Family Medicine)  Hematological/Oncological History # Classical Hodgkin's Lymphoma, Mixed Cellularity. 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 2) 10/27/2020: resection of neck mass. Pathology showed classical Hodgkin's lymphoma, mixed cellularity subtype 3) 11/13/2020: establish care with Dr. Lorenso Courier  4) 11/26/2020: PET CT scan shows single hypermetabolic unenlarged right level II cervical lymph node identified on today's study. FDG uptake is compatible with [place] 4. Other small bilateral cervical and upper normal to borderline hepatoduodenal ligament lymph nodes show Deauville 2 uptake levels 5) 12/18/2020: Cycle 1 Day 1 of AVD chemotherapy  6) 01/15/2021: Cycle 2 Day 1 of AVD chemotherapy  7) 02/12/2021: PET CT scan shows no evidence of residual disease 8) 02/15/2021: Cycle 3 Day 1 of AVD chemotherapy  9) 03/01/2021: Cycle 3 Day 15 of AVD chemotherapy  10) 03/15/2021: Cycle 4 Day 1 of AVD chemotherapy  11) 03/29/2021: Cycle 4 Day 15 of AVD chemotherapy 12) 04/12/2021: Cycle 5 Day 1 of AVD chemotherapy 13) 04/26/2021: Cycle 5 Day 15 of AVD chemotherapy 14) 05/10/2021: Cycle 6 Day 1 of AVD chemotherapy 15) 05/24/2021: Cycle 6 Day 15 of AVD chemotherapy 16) 06/21/2021: PET CT scan shows no evidence of residual/recurrent lymphoma. Patient in complete remission, enter surviellance.  17) 10/04/2022: CT chest abdomen pelvis performed, it showed no evidence of residual or recurrent disease.  Interval History:  [name] 61 y.o. male with medical history significant for Classical Hodgkin's lymphoma Early Stage/Unfavorable risk who presents for a follow up visit. The patient's last visit was on 07/14/2022. In the interim since the last visit   On exam  today [name] reports he has been well overall interim since her last visit.  He notes that he is delighted that his PSA is undetectable.  He notes that his energy is "better than it has been in years".  He notes his appetite is quite strong and he is getting back to work.  He is not having any trouble with fevers, chills, sweats, nausea, vomiting or diarrhea.  He denies any bumps or lumps concerning for recurrence.  Overall he is at his baseline level of health.  He has no other complaints. A full 10 point ROS is listed below.  MEDICAL HISTORY:  Past Medical History:  Diagnosis Date   Cancer ([place]) 09/2014   recent dx prostate ca-no tx yet   Diabetes mellitus without complication ([place])    GERD (gastroesophageal reflux disease)    Hodgkin's lymphoma ([place]) 11/2020   Hypertension    Pneumonia    history   Sleep apnea    wears CPAP   Teeth decayed     SURGICAL HISTORY: Past Surgical History:  Procedure Laterality Date   IR IMAGING GUIDED PORT INSERTION  12/11/2020   IR REMOVAL TUN ACCESS W/ PORT W/O FL MOD SED  07/22/2021   LYMPHADENECTOMY Bilateral 07/07/2022   Procedure: LYMPHADENECTOMY, PELVIC;  Surgeon: Raynelle Bring, MD;  Location: [facility];  Service: Urology;  Laterality: Bilateral;   MASS BIOPSY Left 09/29/2014   Procedure: LEFT LOWER NECK MASS EXCISION;  Surgeon: Izora Gala, MD;  Location: [facility];  Service: ENT;  Laterality: Left;   PAROTIDECTOMY Left 09/29/2014   Procedure: LEFT PAROTIDECTOMY;  Surgeon: Izora Gala, MD;  Location: [facility];  Service: ENT;  Laterality: Left;  ROBOT ASSISTED LAPAROSCOPIC RADICAL PROSTATECTOMY N/A 07/07/2022   Procedure: XI ROBOTIC ASSISTED LAPAROSCOPIC RADICAL PROSTATECTOMY LEVEL 2;  Surgeon: Raynelle Bring, MD;  Location: [facility];  Service: Urology;  Laterality: N/A;   SEPTOPLASTY  1976    SOCIAL HISTORY: Social History   Socioeconomic History   Marital status: Widowed    Spouse name: Not on file   Number of  children: Not on file   Years of education: Not on file   Highest education level: Not on file  Occupational History   Not on file  Tobacco Use   Smoking status: Former    Packs/day: 3.00    Years: 32.00    Total pack years: 96.00    Types: Cigarettes    Quit date: 11/26/2017    Years since quitting: 4.8   Smokeless tobacco: Never  Vaping Use   Vaping Use: Never used  Substance and Sexual Activity   Alcohol use: Yes    Comment: twice a year   Drug use: No   Sexual activity: Yes  Other Topics Concern   Not on file  Social History Narrative   Not on file   Social Determinants of Health   Financial Resource Strain: Not on file  Food Insecurity: Not on file  Transportation Needs: Not on file  Physical Activity: Not on file  Stress: Not on file  Social Connections: Not on file  Intimate Partner Violence: Not on file    FAMILY HISTORY: Family History  Problem Relation Age of Onset   Stroke Mother    Heart disease Father     ALLERGIES:  has No Known Allergies.  MEDICATIONS:  Current Outpatient Medications  Medication Sig Dispense Refill   acetaminophen (TYLENOL) 500 MG tablet Take 1,500-2,000 mg by mouth every 6 (six) hours as needed for headache or moderate pain.     amLODipine (NORVASC) 5 MG tablet Take 5 mg by mouth every evening.     losartan (COZAAR) 100 MG tablet Take 100 mg by mouth every evening.     metFORMIN (GLUCOPHAGE) 500 MG tablet Take 500 mg by mouth 2 (two) times daily with a meal.     pantoprazole (PROTONIX) 40 MG tablet Take 40 mg by mouth every evening.     rosuvastatin (CRESTOR) 5 MG tablet Take 5 mg by mouth every evening.     No current facility-administered medications for this visit.   Facility-Administered Medications Ordered in Other Visits  Medication Dose Route Frequency Provider Last Rate Last Admin   0.9 %  sodium chloride infusion   Intravenous Continuous Allred, Darrell K, PA-C        REVIEW OF SYSTEMS:   Constitutional: ( - )  fevers, ( - )  chills , ( - ) night sweats Eyes: ( - ) blurriness of vision, ( - ) double vision, ( - ) watery eyes Ears, nose, mouth, throat, and face: ( - ) mucositis, ( - ) sore throat Respiratory: ( - ) cough, ( - ) dyspnea, ( - ) wheezes Cardiovascular: ( - ) palpitation, ( - ) chest discomfort, ( - ) lower extremity swelling Gastrointestinal:  ( - ) nausea, ( - ) heartburn, ( - ) change in bowel habits Skin: ( - ) abnormal skin rashes Lymphatics: ( - ) new lymphadenopathy, ( - ) easy bruising Neurological: ( - ) numbness, ( - ) tingling, ( - ) new weaknesses Behavioral/Psych: ( - ) mood change, ( - ) new changes  All other systems were reviewed  with the patient and are negative.  PHYSICAL EXAMINATION: ECOG PERFORMANCE STATUS: 1 - Symptomatic but completely ambulatory  Vitals:   10/05/22 1432  BP: (!) 145/75  Pulse: 83  Resp: 16  Temp: 97.7 F (36.5 C)  SpO2: 94%     Filed Weights   10/05/22 1432  Weight: 261 lb 11.2 oz (118.7 kg)      GENERAL: well appearing middle aged Caucasian male in NAD SKIN: skin color, texture, turgor are normal, no rashes or significant lesions EYES: conjunctiva are pink and non-injected, sclera clear LUNGS: clear to auscultation and percussion with normal breathing effort HEART: regular rate & rhythm and no murmurs and no lower extremity edema Musculoskeletal: no cyanosis of digits and no clubbing  PSYCH: alert & oriented x 3, fluent speech NEURO: no focal motor/sensory deficits  LABORATORY DATA:  I have reviewed the data as listed    Latest Ref Rng & Units 10/05/2022    2:00 PM 07/14/2022   10:53 AM 07/08/2022    4:01 AM  CBC  WBC 4.0 - 10.5 K/uL 5.3  8.4    Hemoglobin 13.0 - 17.0 g/dL 15.2  14.8  13.6   Hematocrit 39.0 - 52.0 % 44.1  43.3  40.7   Platelets 150 - 400 K/uL 169  234         Latest Ref Rng & Units 10/05/2022    2:00 PM 10/04/2022    2:25 PM 07/14/2022   10:53 AM  CMP  Glucose 70 - 99 mg/dL 161   148   BUN 8 - 23  mg/dL 13   15   Creatinine 0.61 - 1.24 mg/dL 1.01  1.00  0.99   Sodium 135 - 145 mmol/L 139   139   Potassium 3.5 - 5.1 mmol/L 3.7   3.9   Chloride 98 - 111 mmol/L 105   106   CO2 22 - 32 mmol/L 25   25   Calcium 8.9 - 10.3 mg/dL 9.5   9.5   Total Protein 6.5 - 8.1 g/dL 7.5   7.9   Total Bilirubin 0.3 - 1.2 mg/dL 0.3   0.5   Alkaline Phos 38 - 126 U/L 54   59   AST 15 - 41 U/L 21   14   ALT 0 - 44 U/L 23   18     No results found for: "MPROTEIN" No results found for: "KPAFRELGTCHN", "LAMBDASER", "KAPLAMBRATIO"   RADIOGRAPHIC STUDIES:  CT CHEST ABDOMEN PELVIS W CONTRAST  Result Date: 10/04/2022 CLINICAL DATA:  History of Hodgkin's lymphoma, follow-up. Also history of prostate cancer. * Tracking Code: BO * EXAM: CT CHEST, ABDOMEN, AND PELVIS WITH CONTRAST TECHNIQUE: Multidetector CT imaging of the chest, abdomen and pelvis was performed following the standard protocol during bolus administration of intravenous contrast. RADIATION DOSE REDUCTION: This exam was performed according to the departmental dose-optimization program which includes automated exposure control, adjustment of the mA and/or kV according to patient size and/or use of iterative reconstruction technique. CONTRAST:  11m OMNIPAQUE IOHEXOL 300 MG/ML  SOLN COMPARISON:  Multiple priors including CT chest abdomen pelvis dated Apr 14, 2022 FINDINGS: CT CHEST FINDINGS Cardiovascular: Aortic atherosclerosis. No central pulmonary embolus on this nondedicated study. Normal size heart. No significant pericardial effusion/thickening. Mediastinum/Nodes: No supraclavicular adenopathy. No suspicious thyroid nodule. No pathologically enlarged mediastinal, hilar or axillary lymph nodes. The esophagus is grossly unremarkable. Lungs/Pleura: Hypoventilatory change in the lung bases. No suspicious pulmonary nodules or masses. No pleural effusion. No pneumothorax. Musculoskeletal: Stable  benign appearing subcutaneous cystic foci for instance in the  posterior chest wall on image 31/2 measuring 20 mm and in the paramedian anterior chest wall measuring 16 mm on image 39/2 likely reflecting benign epithelial cysts. Similar appearance of the benign intraosseous vertebral body hemangiomas. No aggressive lytic or blastic lesion of bone. CT ABDOMEN PELVIS FINDINGS Hepatobiliary: No suspicious hepatic lesion. Gallbladder is unremarkable. No biliary ductal dilation. Pancreas: No pancreatic ductal dilation or evidence of acute inflammation. Spleen: No splenomegaly or focal splenic lesion. Calcified splenic granulomata. Adrenals/Urinary Tract: Bilateral adrenal glands appear normal. No hydronephrosis. Kidneys demonstrate symmetric enhancement and excretion of contrast material. Urinary bladder is unremarkable for degree of distension. Stomach/Bowel: Stomach is unremarkable for degree of distension. No pathologic dilation of small or large bowel. Appendix and terminal ileum appear normal. No evidence of acute bowel inflammation. Vascular/Lymphatic: Aortic atherosclerosis. No pathologically enlarged abdominal or pelvic lymph nodes. Reproductive: Prostate gland is surgically absent. Other: Small fat containing left inguinal and paraumbilical hernias. Musculoskeletal: Multilevel degenerative changes spine. Degenerative change of the bilateral hips and SI joints. IMPRESSION: 1. No adenopathy above or below the diaphragm and no splenomegaly. 2. Status post prostatectomy. 3. Small fat containing left inguinal and paraumbilical hernias. 4.  Aortic Atherosclerosis (ICD10-I70.0). Electronically Signed   By: Dahlia Bailiff M.D.   On: 10/04/2022 15:09    ASSESSMENT & PLAN [name] 61 y.o. male with medical history significant for Classical Hodgkin's lymphoma Early Stage/Unfavorable risk who presents for a follow up visit.  After review the labs, the records, review of the PFTs, and review the echocardiogram the findings are most consistent with an early stage/unfavorable risk  classical Hodgkin's lymphoma.  Given that he meets unfavorable criteria via multiple guidelines I recommended that we proceed with treatment as recommended in the NCCN guidelines for patients with early stage unfavorable disease (which is the exact same treatment recommended for stage III-IV disease).  Previously we discussed the AVD chemotherapy regimen.  We discussed the expected side effects including possible hair loss, nausea, vomiting, diarrhea, constipation, cytopenias, neutropenia, cardiac dysfunction, and neurological damage.  The patient will be treated with every 2 week rounds of chemotherapy using this regimen. AVD chemotherapy consists of doxorubicin 25 mg/m on days 1 and 15, vinblastine 6 mg per metered squared IV on days 1 and 15, and dacarbazine 375 mg/m IV on days 1 and 15.  After 2 cycles of AVD chemotherapy, PET/CT scan from 02/12/2021 revealed complete response to therapy with no evidence of lymphoma. The recommendation is to proceed with a further 4 cycles of AVD therapy.   # Classical Hodgkin's Lymphoma, Mixed Cellularity. 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  --findings were consistent with an Early stage unfavorable risk disease (due to age, ESR elevation, and mixed cellularity based on EORTC, GHSG, and ECGO criteria) --his disease appears early stage with one clear site of involvement in the neck. There are some smaller lymph nodes in the neck and one near the hepatoduodenal ligament, though would favor diagnosis of early stage --based on these criteria the recommendation would be for AVD x 2 cycles with interval PET CT scan followed by AVD x 4 cycles.  --interval PET on 06/21/2021 showed complete response to therapy --we avoided bleomycin in this patient due to smoking history, mild abnormalities on PFTs and age Plan: -- Labs (CBC, CMP, LDH) from today were reviewed without any intervention needed.  -- Labs show white blood cell count 5.3, hemoglobin 8.2, MCV 84.5, and  platelets 169 --CT scan q  6 months. Next scan due May 2024.  Last scan on 10/04/2022 showed no evidence of residual recurrent disease. --RTC in 3 months   #Supportive Care --chemotherapy education complete --port removed on 07/22/2021.   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 No biopsy recommended.  --continue to monitor on future scans.  #Prostate Cancer, Gleason 3+4=7. S/p resection --s/p robotic assisted laparoscopic radical prostatectomy on 07/07/2022.  -- Noted to have benign lymph nodes and Prostatic adenocarcinoma, Gleason score 3+4=7  -- Currently being managed by urology service.  No orders of the defined types were placed in this encounter.  All questions were answered. The patient knows to call the clinic with any problems, questions or concerns.  I have spent a total of 30 minutes minutes of face-to-face and non-face-to-face time, preparing to see the patient, obtaining and/or reviewing separately obtained history, performing a medically appropriate examination, counseling and educating the patient, ordering tests,documenting clinical information in the electronic health record and care coordination.   Ledell Peoples, MD Department of Hematology/Oncology [place] [facility] Phone: [phone] Pager: [phone] Email: [email or web address]  10/14/2022 10:20 AM

## 2022-10-14 ENCOUNTER — Encounter: Payer: Self-pay | Admitting: Hematology and Oncology

## 2022-10-17 ENCOUNTER — Other Ambulatory Visit: Payer: BC Managed Care – PPO

## 2022-10-17 ENCOUNTER — Ambulatory Visit: Payer: BC Managed Care – PPO | Admitting: Hematology and Oncology

## 2022-11-04 ENCOUNTER — Telehealth: Payer: Self-pay | Admitting: *Deleted

## 2022-11-04 NOTE — Patient Outreach (Signed)
  Care Coordination   11/04/2022 Name: Etienne Mowers MRN: [id number] DOB: 09-22-1961   Care Coordination Outreach Attempts:  An unsuccessful telephone outreach was attempted today to offer the patient information about available care coordination services as a benefit of their health plan.   Follow Up Plan:  Additional outreach attempts will be made to offer the patient care coordination information and services.   Encounter Outcome:  No Answer   Care Coordination Interventions:  No, not indicated    Raina Mina, RN Care Management Coordinator [place] [facility] [phone]

## 2022-11-16 DIAGNOSIS — I10 Essential (primary) hypertension: Secondary | ICD-10-CM | POA: Diagnosis not present

## 2022-11-16 DIAGNOSIS — G4733 Obstructive sleep apnea (adult) (pediatric): Secondary | ICD-10-CM | POA: Diagnosis not present

## 2022-11-16 DIAGNOSIS — E1169 Type 2 diabetes mellitus with other specified complication: Secondary | ICD-10-CM | POA: Diagnosis not present

## 2022-11-16 DIAGNOSIS — E785 Hyperlipidemia, unspecified: Secondary | ICD-10-CM | POA: Diagnosis not present

## 2022-12-30 ENCOUNTER — Encounter: Payer: Self-pay | Admitting: Hematology and Oncology

## 2023-01-04 ENCOUNTER — Encounter: Payer: Self-pay | Admitting: Hematology and Oncology

## 2023-01-06 ENCOUNTER — Other Ambulatory Visit: Payer: Self-pay | Admitting: Hematology and Oncology

## 2023-01-06 ENCOUNTER — Inpatient Hospital Stay (HOSPITAL_BASED_OUTPATIENT_CLINIC_OR_DEPARTMENT_OTHER): Payer: BC Managed Care – PPO | Admitting: Hematology and Oncology

## 2023-01-06 ENCOUNTER — Inpatient Hospital Stay: Payer: BC Managed Care – PPO | Attending: Hematology and Oncology

## 2023-01-06 VITALS — BP 152/86 | HR 74 | Temp 98.1°F | Resp 15 | Wt 248.6 lb

## 2023-01-06 DIAGNOSIS — Z7984 Long term (current) use of oral hypoglycemic drugs: Secondary | ICD-10-CM | POA: Insufficient documentation

## 2023-01-06 DIAGNOSIS — C8121 Mixed cellularity classical Hodgkin lymphoma, lymph nodes of head, face, and neck: Secondary | ICD-10-CM

## 2023-01-06 DIAGNOSIS — Z9079 Acquired absence of other genital organ(s): Secondary | ICD-10-CM | POA: Diagnosis not present

## 2023-01-06 DIAGNOSIS — Z87891 Personal history of nicotine dependence: Secondary | ICD-10-CM | POA: Insufficient documentation

## 2023-01-06 DIAGNOSIS — E119 Type 2 diabetes mellitus without complications: Secondary | ICD-10-CM | POA: Diagnosis not present

## 2023-01-06 DIAGNOSIS — I10 Essential (primary) hypertension: Secondary | ICD-10-CM | POA: Insufficient documentation

## 2023-01-06 DIAGNOSIS — Z8571 Personal history of Hodgkin lymphoma: Secondary | ICD-10-CM | POA: Diagnosis not present

## 2023-01-06 DIAGNOSIS — Z79899 Other long term (current) drug therapy: Secondary | ICD-10-CM | POA: Insufficient documentation

## 2023-01-06 DIAGNOSIS — Z8546 Personal history of malignant neoplasm of prostate: Secondary | ICD-10-CM | POA: Insufficient documentation

## 2023-01-06 DIAGNOSIS — Z9221 Personal history of antineoplastic chemotherapy: Secondary | ICD-10-CM | POA: Diagnosis not present

## 2023-01-06 LAB — CMP (CANCER CENTER ONLY)
ALT: 12 U/L (ref 0–44)
AST: 14 U/L — ABNORMAL LOW (ref 15–41)
Albumin: 4.3 g/dL (ref 3.5–5.0)
Alkaline Phosphatase: 61 U/L (ref 38–126)
Anion gap: 7 (ref 5–15)
BUN: 14 mg/dL (ref 8–23)
CO2: 27 mmol/L (ref 22–32)
Calcium: 9.2 mg/dL (ref 8.9–10.3)
Chloride: 104 mmol/L (ref 98–111)
Creatinine: 0.91 mg/dL (ref 0.61–1.24)
GFR, Estimated: >60 mL/min (ref >60)
Glucose, Bld: 129 mg/dL — ABNORMAL HIGH (ref 70–99)
Potassium: 3.6 mmol/L (ref 3.5–5.1)
Sodium: 138 mmol/L (ref 135–145)
Total Bilirubin: 0.3 mg/dL (ref 0.3–1.2)
Total Protein: 6.9 g/dL (ref 6.5–8.1)

## 2023-01-06 LAB — CBC WITH DIFFERENTIAL (CANCER CENTER ONLY)
Abs Immature Granulocytes: 0.02 10*3/uL (ref 0.00–0.07)
Basophils Absolute: 0.1 10*3/uL (ref 0.0–0.1)
Basophils Relative: 1 %
Eosinophils Absolute: 0.2 10*3/uL (ref 0.0–0.5)
Eosinophils Relative: 3 %
HCT: 41.8 % (ref 39.0–52.0)
Hemoglobin: 14.5 g/dL (ref 13.0–17.0)
Immature Granulocytes: 0 %
Lymphocytes Relative: 26 %
Lymphs Abs: 1.7 10*3/uL (ref 0.7–4.0)
MCH: 29.4 pg (ref 26.0–34.0)
MCHC: 34.7 g/dL (ref 30.0–36.0)
MCV: 84.6 fL (ref 80.0–100.0)
Monocytes Absolute: 0.5 10*3/uL (ref 0.1–1.0)
Monocytes Relative: 8 %
Neutro Abs: 4.1 10*3/uL (ref 1.7–7.7)
Neutrophils Relative %: 62 %
Platelet Count: 168 10*3/uL (ref 150–400)
RBC: 4.94 MIL/uL (ref 4.22–5.81)
RDW: 13.6 % (ref 11.5–15.5)
WBC Count: 6.5 10*3/uL (ref 4.0–10.5)
nRBC: 0 % (ref 0.0–0.2)

## 2023-01-06 LAB — LACTATE DEHYDROGENASE: LDH: 114 U/L (ref 98–192)

## 2023-01-06 NOTE — Progress Notes (Signed)
[place] [phone]   [phone]  PROGRESS NOTE  Patient Care Team: London Pepper, MD as PCP - General (Family Medicine)  Hematological/Oncological History # Classical Hodgkin's Lymphoma, Mixed Cellularity. 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 2) 10/27/2020: resection of neck mass. Pathology showed classical Hodgkin's lymphoma, mixed cellularity subtype 3) 11/13/2020: establish care with Dr. Lorenso Courier  4) 11/26/2020: PET CT scan shows single hypermetabolic unenlarged right level II cervical lymph node identified on today's study. FDG uptake is compatible with [place] 4. Other small bilateral cervical and upper normal to borderline hepatoduodenal ligament lymph nodes show Deauville 2 uptake levels 5) 12/18/2020: Cycle 1 Day 1 of AVD chemotherapy  6) 01/15/2021: Cycle 2 Day 1 of AVD chemotherapy  7) 02/12/2021: PET CT scan shows no evidence of residual disease 8) 02/15/2021: Cycle 3 Day 1 of AVD chemotherapy  9) 03/01/2021: Cycle 3 Day 15 of AVD chemotherapy  10) 03/15/2021: Cycle 4 Day 1 of AVD chemotherapy  11) 03/29/2021: Cycle 4 Day 15 of AVD chemotherapy 12) 04/12/2021: Cycle 5 Day 1 of AVD chemotherapy 13) 04/26/2021: Cycle 5 Day 15 of AVD chemotherapy 14) 05/10/2021: Cycle 6 Day 1 of AVD chemotherapy 15) 05/24/2021: Cycle 6 Day 15 of AVD chemotherapy 16) 06/21/2021: PET CT scan shows no evidence of residual/recurrent lymphoma. Patient in complete remission, enter surviellance.  17) 10/04/2022: CT chest abdomen pelvis performed, it showed no evidence of residual or recurrent disease.  Interval History:  [name] 62 y.o. male with medical history significant for Classical Hodgkin's lymphoma Early Stage/Unfavorable risk who presents for a follow up visit. The patient's last visit was on 10/05/2022. In the interim since the last visit he has  transitioned jobs and is now working as a Pharmacist, community for [employer].  On exam today [name] reports he feels well overall.  He reports he was getting "tired of being on the road" and that works at [employer].  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  He reports that he is feeling good and "working his butt off".  He notes that he is down about 20 pounds and thinks this may be due to his increased physical activity.  He notes his energy is good but some days he feels "worn out".  He notes he has a rare headache and denies any bumps or lumps consistent with lymphadenopathy.  He is not having any fevers, chills, sweats, nausea, vomiting, or diarrhea..  Overall he is at his baseline level of health.  He has no other complaints. A full 10 point ROS is listed below.  MEDICAL HISTORY:  Past Medical History:  Diagnosis Date   Cancer ([place]) 09/2014   recent dx prostate ca-no tx yet   Diabetes mellitus without complication ([place])    GERD (gastroesophageal reflux disease)    Hodgkin's lymphoma ([place]) 11/2020   Hypertension    Pneumonia    history   Sleep apnea    wears CPAP   Teeth decayed     SURGICAL HISTORY: Past Surgical History:  Procedure Laterality Date   IR IMAGING GUIDED PORT INSERTION  12/11/2020   IR REMOVAL TUN ACCESS W/ PORT W/O FL MOD SED  07/22/2021   LYMPHADENECTOMY Bilateral 07/07/2022   Procedure: LYMPHADENECTOMY, PELVIC;  Surgeon: Raynelle Bring, MD;  Location: [facility];  Service: Urology;  Laterality: Bilateral;   MASS BIOPSY Left 09/29/2014   Procedure: LEFT LOWER NECK MASS EXCISION;  Surgeon: Izora Gala, MD;  Location: [place];  Service: ENT;  Laterality: Left;   PAROTIDECTOMY Left 09/29/2014   Procedure: LEFT PAROTIDECTOMY;  Surgeon: Izora Gala, MD;  Location: [facility];  Service: ENT;  Laterality: Left;   ROBOT ASSISTED LAPAROSCOPIC RADICAL PROSTATECTOMY N/A 07/07/2022   Procedure: XI ROBOTIC ASSISTED LAPAROSCOPIC RADICAL PROSTATECTOMY LEVEL 2;   Surgeon: Raynelle Bring, MD;  Location: [facility];  Service: Urology;  Laterality: N/A;   SEPTOPLASTY  1976    SOCIAL HISTORY: Social History   Socioeconomic History   Marital status: Widowed    Spouse name: Not on file   Number of children: Not on file   Years of education: Not on file   Highest education level: Not on file  Occupational History   Not on file  Tobacco Use   Smoking status: Former    Packs/day: 3.00    Years: 32.00    Total pack years: 96.00    Types: Cigarettes    Quit date: 11/26/2017    Years since quitting: 5.1   Smokeless tobacco: Never  Vaping Use   Vaping Use: Never used  Substance and Sexual Activity   Alcohol use: Yes    Comment: twice a year   Drug use: No   Sexual activity: Yes  Other Topics Concern   Not on file  Social History Narrative   Not on file   Social Determinants of Health   Financial Resource Strain: Not on file  Food Insecurity: Not on file  Transportation Needs: Not on file  Physical Activity: Not on file  Stress: Not on file  Social Connections: Not on file  Intimate Partner Violence: Not on file    FAMILY HISTORY: Family History  Problem Relation Age of Onset   Stroke Mother    Heart disease Father     ALLERGIES:  has No Known Allergies.  MEDICATIONS:  Current Outpatient Medications  Medication Sig Dispense Refill   acetaminophen (TYLENOL) 500 MG tablet Take 1,500-2,000 mg by mouth every 6 (six) hours as needed for headache or moderate pain.     amLODipine (NORVASC) 5 MG tablet Take 5 mg by mouth every evening.     losartan (COZAAR) 100 MG tablet Take 100 mg by mouth every evening.     metFORMIN (GLUCOPHAGE) 500 MG tablet Take 500 mg by mouth 2 (two) times daily with a meal.     pantoprazole (PROTONIX) 40 MG tablet Take 40 mg by mouth every evening.     rosuvastatin (CRESTOR) 5 MG tablet Take 5 mg by mouth every evening.     No current facility-administered medications for this visit.   Facility-Administered  Medications Ordered in Other Visits  Medication Dose Route Frequency Provider Last Rate Last Admin   0.9 %  sodium chloride infusion   Intravenous Continuous Allred, Darrell K, PA-C        REVIEW OF SYSTEMS:   Constitutional: ( - ) fevers, ( - )  chills , ( - ) night sweats Eyes: ( - ) blurriness of vision, ( - ) double vision, ( - ) watery eyes Ears, nose, mouth, throat, and face: ( - ) mucositis, ( - ) sore throat Respiratory: ( - ) cough, ( - ) dyspnea, ( - ) wheezes Cardiovascular: ( - ) palpitation, ( - ) chest discomfort, ( - ) lower extremity swelling Gastrointestinal:  ( - ) nausea, ( - ) heartburn, ( - ) change in bowel habits Skin: ( - )  abnormal skin rashes Lymphatics: ( - ) new lymphadenopathy, ( - ) easy bruising Neurological: ( - ) numbness, ( - ) tingling, ( - ) new weaknesses Behavioral/Psych: ( - ) mood change, ( - ) new changes  All other systems were reviewed with the patient and are negative.  PHYSICAL EXAMINATION: ECOG PERFORMANCE STATUS: 1 - Symptomatic but completely ambulatory  Vitals:   01/06/23 1413  BP: (!) 152/86  Pulse: 74  Resp: 15  Temp: 98.1 F (36.7 C)  SpO2: 95%     Filed Weights   01/06/23 1413  Weight: 248 lb 9.6 oz (112.8 kg)      GENERAL: well appearing middle aged Caucasian male in NAD SKIN: skin color, texture, turgor are normal, no rashes or significant lesions EYES: conjunctiva are pink and non-injected, sclera clear LUNGS: clear to auscultation and percussion with normal breathing effort HEART: regular rate & rhythm and no murmurs and no lower extremity edema Musculoskeletal: no cyanosis of digits and no clubbing  PSYCH: alert & oriented x 3, fluent speech NEURO: no focal motor/sensory deficits  LABORATORY DATA:  I have reviewed the data as listed    Latest Ref Rng & Units 01/06/2023    1:56 PM 10/05/2022    2:00 PM 07/14/2022   10:53 AM  CBC  WBC 4.0 - 10.5 K/uL 6.5  5.3  8.4   Hemoglobin 13.0 - 17.0 g/dL 14.5  15.2   14.8   Hematocrit 39.0 - 52.0 % 41.8  44.1  43.3   Platelets 150 - 400 K/uL 168  169  234        Latest Ref Rng & Units 01/06/2023    1:56 PM 10/05/2022    2:00 PM 10/04/2022    2:25 PM  CMP  Glucose 70 - 99 mg/dL 129  161    BUN 8 - 23 mg/dL 14  13    Creatinine 0.61 - 1.24 mg/dL 0.91  1.01  1.00   Sodium 135 - 145 mmol/L 138  139    Potassium 3.5 - 5.1 mmol/L 3.6  3.7    Chloride 98 - 111 mmol/L 104  105    CO2 22 - 32 mmol/L 27  25    Calcium 8.9 - 10.3 mg/dL 9.2  9.5    Total Protein 6.5 - 8.1 g/dL 6.9  7.5    Total Bilirubin 0.3 - 1.2 mg/dL 0.3  0.3    Alkaline Phos 38 - 126 U/L 61  54    AST 15 - 41 U/L 14  21    ALT 0 - 44 U/L 12  23      No results found for: "MPROTEIN" No results found for: "KPAFRELGTCHN", "LAMBDASER", "KAPLAMBRATIO"   RADIOGRAPHIC STUDIES:  No results found.  ASSESSMENT & PLAN [name] 62 y.o. male with medical history significant for Classical Hodgkin's lymphoma Early Stage/Unfavorable risk who presents for a follow up visit.  After review the labs, the records, review of the PFTs, and review the echocardiogram the findings are most consistent with an early stage/unfavorable risk classical Hodgkin's lymphoma.  Given that he meets unfavorable criteria via multiple guidelines I recommended that we proceed with treatment as recommended in the NCCN guidelines for patients with early stage unfavorable disease (which is the exact same treatment recommended for stage III-IV disease).  Previously we discussed the AVD chemotherapy regimen.  We discussed the expected side effects including possible hair loss, nausea, vomiting, diarrhea, constipation, cytopenias, neutropenia, cardiac dysfunction, and neurological damage.  The patient will be treated with every 2 week rounds of chemotherapy using this regimen. AVD chemotherapy consists of doxorubicin 25 mg/m on days 1 and 15, vinblastine 6 mg per metered squared IV on days 1 and 15, and dacarbazine 375 mg/m IV  on days 1 and 15.  After 2 cycles of AVD chemotherapy, PET/CT scan from 02/12/2021 revealed complete response to therapy with no evidence of lymphoma. The recommendation is to proceed with a further 4 cycles of AVD therapy.   # Classical Hodgkin's Lymphoma, Mixed Cellularity. 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  --findings were consistent with an Early stage unfavorable risk disease (due to age, ESR elevation, and mixed cellularity based on EORTC, GHSG, and ECGO criteria) --his disease appears early stage with one clear site of involvement in the neck. There are some smaller lymph nodes in the neck and one near the hepatoduodenal ligament, though would favor diagnosis of early stage --based on these criteria the recommendation would be for AVD x 2 cycles with interval PET CT scan followed by AVD x 4 cycles.  --interval PET on 06/21/2021 showed complete response to therapy --we avoided bleomycin in this patient due to smoking history, mild abnormalities on PFTs and age Plan: -- Labs (CBC, CMP, LDH) from today were reviewed without any intervention needed.  -- Labs show white blood cell count 6.5, hemoglobin 14.5, MCV 84.6, and platelets of 168 --CT scan q 6 months. Next scan due May 2024.  Last scan on 10/04/2022 showed no evidence of residual recurrent disease. --RTC in 3 months   #Supportive Care --chemotherapy education complete --port removed on 07/22/2021.   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 No biopsy recommended.  --continue to monitor on future scans.  #Prostate Cancer, Gleason 3+4=7. S/p resection --s/p robotic assisted laparoscopic radical prostatectomy on 07/07/2022.  -- Noted to have benign lymph nodes and Prostatic adenocarcinoma, Gleason score 3+4=7  -- Currently being managed by urology  service.  Orders Placed This Encounter  Procedures   CT CHEST ABDOMEN PELVIS W CONTRAST    Standing Status:   Future    Standing Expiration Date:   01/07/2024    Order Specific Question:   If indicated for the ordered procedure, I authorize the administration of contrast media per Radiology protocol    Answer:   Yes    Order Specific Question:   Does the patient have a contrast media/X-ray dye allergy?    Answer:   No    Order Specific Question:   Preferred imaging location?    Answer:   [facility]    Order Specific Question:   Is Oral Contrast requested for this exam?    Answer:   Yes, Per Radiology protocol   All questions were answered. The patient knows to call the clinic with any problems, questions or concerns.  I have spent a total of 30 minutes minutes of face-to-face and non-face-to-face time, preparing to see the patient, obtaining and/or reviewing separately obtained history, performing a medically appropriate examination, counseling and educating the patient, ordering tests,documenting clinical information in the electronic health record and care coordination.   Ledell Peoples, MD Department of Hematology/Oncology [place] [facility] Phone: [phone] Pager: [phone] Email: [email or web address]  01/09/2023 9:33 AM

## 2023-01-09 ENCOUNTER — Telehealth: Payer: Self-pay | Admitting: Hematology and Oncology

## 2023-01-09 ENCOUNTER — Encounter: Payer: Self-pay | Admitting: Hematology and Oncology

## 2023-01-09 NOTE — Telephone Encounter (Signed)
Patient called back to to confirm appointments. Patient notified.

## 2023-01-09 NOTE — Telephone Encounter (Signed)
Called patiet per 2/2 los notes to schedule f/u. Left voicemail with new appointment information and direct contact details.

## 2023-01-18 ENCOUNTER — Telehealth: Payer: Self-pay | Admitting: Hematology and Oncology

## 2023-01-18 NOTE — Telephone Encounter (Signed)
Called patient per provider PAL to reschedule May appointments. Left voicemail with new appointment information and contact details if needing to reschedule

## 2023-01-19 ENCOUNTER — Telehealth: Payer: Self-pay | Admitting: Hematology and Oncology

## 2023-01-19 NOTE — Telephone Encounter (Signed)
Called patient per provider PAL to return message. Patient confirmed new appointment dates. Left voicemail with number for central radiology to be able to schedule scan.

## 2023-01-19 NOTE — Telephone Encounter (Signed)
Patient returned call to confirm rescheduled appointments and confirm that he calls central radiology to get a sooner appointment for CT scans.

## 2023-03-21 ENCOUNTER — Ambulatory Visit (HOSPITAL_COMMUNITY)
Admission: RE | Admit: 2023-03-21 | Discharge: 2023-03-21 | Disposition: A | Discharge status: Home / Self Care | Payer: BC Managed Care – PPO | Source: Ambulatory Visit | Attending: Hematology and Oncology | Admitting: Hematology and Oncology

## 2023-03-21 DIAGNOSIS — C8191 Hodgkin lymphoma, unspecified, lymph nodes of head, face, and neck: Secondary | ICD-10-CM | POA: Diagnosis not present

## 2023-03-21 DIAGNOSIS — C8121 Mixed cellularity classical Hodgkin lymphoma, lymph nodes of head, face, and neck: Secondary | ICD-10-CM | POA: Diagnosis not present

## 2023-03-21 LAB — POCT I-STAT CREATININE: Creatinine, Ser: 1.1 mg/dL (ref 0.61–1.24)

## 2023-03-21 MED ORDER — IOHEXOL 300 MG/ML  SOLN
100.0000 mL | Freq: Once | INTRAMUSCULAR | Status: AC | PRN
  Administered 2023-03-21: 100 mL via INTRAVENOUS

## 2023-03-21 MED ORDER — IOHEXOL 9 MG/ML PO SOLN
ORAL | Status: AC
  Filled 2023-03-21: qty 1000

## 2023-03-21 MED ORDER — IOHEXOL 9 MG/ML PO SOLN
500.0000 mL | ORAL | Status: AC
  Administered 2023-03-21 (×2): 500 mL via ORAL

## 2023-03-23 ENCOUNTER — Telehealth: Payer: Self-pay | Admitting: *Deleted

## 2023-03-23 NOTE — Telephone Encounter (Signed)
-----   Message from Jaci Standard, MD sent at 03/22/2023  1:07 PM EDT ----- Please let Mr. Tyska know that his CT scan showed no evidence of residual/recurrent disease.  ----- Message ----- From: Interface, Rad Results In Sent: 03/22/2023  10:51 AM EDT To: Jaci Standard, MD

## 2023-03-23 NOTE — Telephone Encounter (Signed)
Received call back from patient. Advised  that his CT scan showed no evidence of residual/recurrent disease.  Pt voiced understanding. He is aware of his appts next Friday, $/26/24

## 2023-03-23 NOTE — Telephone Encounter (Signed)
[name] regarding recent CT scan results. No answer but was able to leave vm message asking pt to return call regarding his results.

## 2023-03-31 ENCOUNTER — Inpatient Hospital Stay (HOSPITAL_BASED_OUTPATIENT_CLINIC_OR_DEPARTMENT_OTHER): Payer: BC Managed Care – PPO | Admitting: Hematology and Oncology

## 2023-03-31 ENCOUNTER — Other Ambulatory Visit: Payer: Self-pay | Admitting: Hematology and Oncology

## 2023-03-31 ENCOUNTER — Inpatient Hospital Stay: Payer: BC Managed Care – PPO | Attending: Hematology and Oncology

## 2023-03-31 VITALS — BP 137/74 | HR 79 | Temp 98.2°F | Resp 16 | Wt 249.3 lb

## 2023-03-31 DIAGNOSIS — Z9079 Acquired absence of other genital organ(s): Secondary | ICD-10-CM | POA: Diagnosis not present

## 2023-03-31 DIAGNOSIS — E119 Type 2 diabetes mellitus without complications: Secondary | ICD-10-CM | POA: Insufficient documentation

## 2023-03-31 DIAGNOSIS — Z5111 Encounter for antineoplastic chemotherapy: Secondary | ICD-10-CM | POA: Diagnosis not present

## 2023-03-31 DIAGNOSIS — Z8571 Personal history of Hodgkin lymphoma: Secondary | ICD-10-CM | POA: Diagnosis not present

## 2023-03-31 DIAGNOSIS — Z79899 Other long term (current) drug therapy: Secondary | ICD-10-CM | POA: Diagnosis not present

## 2023-03-31 DIAGNOSIS — K219 Gastro-esophageal reflux disease without esophagitis: Secondary | ICD-10-CM | POA: Insufficient documentation

## 2023-03-31 DIAGNOSIS — Z87891 Personal history of nicotine dependence: Secondary | ICD-10-CM | POA: Diagnosis not present

## 2023-03-31 DIAGNOSIS — G4730 Sleep apnea, unspecified: Secondary | ICD-10-CM | POA: Insufficient documentation

## 2023-03-31 DIAGNOSIS — Z8546 Personal history of malignant neoplasm of prostate: Secondary | ICD-10-CM | POA: Insufficient documentation

## 2023-03-31 DIAGNOSIS — I10 Essential (primary) hypertension: Secondary | ICD-10-CM | POA: Insufficient documentation

## 2023-03-31 DIAGNOSIS — Z7984 Long term (current) use of oral hypoglycemic drugs: Secondary | ICD-10-CM | POA: Insufficient documentation

## 2023-03-31 DIAGNOSIS — C8121 Mixed cellularity classical Hodgkin lymphoma, lymph nodes of head, face, and neck: Secondary | ICD-10-CM

## 2023-03-31 DIAGNOSIS — Z9221 Personal history of antineoplastic chemotherapy: Secondary | ICD-10-CM | POA: Insufficient documentation

## 2023-03-31 DIAGNOSIS — Z95828 Presence of other vascular implants and grafts: Secondary | ICD-10-CM | POA: Diagnosis not present

## 2023-03-31 LAB — CMP (CANCER CENTER ONLY)
ALT: 17 U/L (ref 0–44)
AST: 16 U/L (ref 15–41)
Albumin: 4.5 g/dL (ref 3.5–5.0)
Alkaline Phosphatase: 56 U/L (ref 38–126)
Anion gap: 8 (ref 5–15)
BUN: 13 mg/dL (ref 8–23)
CO2: 26 mmol/L (ref 22–32)
Calcium: 9.4 mg/dL (ref 8.9–10.3)
Chloride: 106 mmol/L (ref 98–111)
Creatinine: 1.07 mg/dL (ref 0.61–1.24)
GFR, Estimated: >60 mL/min (ref >60)
Glucose, Bld: 122 mg/dL — ABNORMAL HIGH (ref 70–99)
Potassium: 3.7 mmol/L (ref 3.5–5.1)
Sodium: 140 mmol/L (ref 135–145)
Total Bilirubin: 0.4 mg/dL (ref 0.3–1.2)
Total Protein: 7.1 g/dL (ref 6.5–8.1)

## 2023-03-31 LAB — CBC WITH DIFFERENTIAL (CANCER CENTER ONLY)
Abs Immature Granulocytes: 0.02 10*3/uL (ref 0.00–0.07)
Basophils Absolute: 0.1 10*3/uL (ref 0.0–0.1)
Basophils Relative: 1 %
Eosinophils Absolute: 0.2 10*3/uL (ref 0.0–0.5)
Eosinophils Relative: 3 %
HCT: 43.5 % (ref 39.0–52.0)
Hemoglobin: 14.8 g/dL (ref 13.0–17.0)
Immature Granulocytes: 0 %
Lymphocytes Relative: 30 %
Lymphs Abs: 1.9 10*3/uL (ref 0.7–4.0)
MCH: 29.3 pg (ref 26.0–34.0)
MCHC: 34 g/dL (ref 30.0–36.0)
MCV: 86.1 fL (ref 80.0–100.0)
Monocytes Absolute: 0.7 10*3/uL (ref 0.1–1.0)
Monocytes Relative: 11 %
Neutro Abs: 3.5 10*3/uL (ref 1.7–7.7)
Neutrophils Relative %: 55 %
Platelet Count: 172 10*3/uL (ref 150–400)
RBC: 5.05 MIL/uL (ref 4.22–5.81)
RDW: 13.2 % (ref 11.5–15.5)
WBC Count: 6.3 10*3/uL (ref 4.0–10.5)
nRBC: 0 % (ref 0.0–0.2)

## 2023-03-31 LAB — LACTATE DEHYDROGENASE: LDH: 103 U/L (ref 98–192)

## 2023-03-31 NOTE — Progress Notes (Signed)
[facility] [phone]   [phone]  PROGRESS NOTE  Patient Care Team: Farris Has, MD as PCP - General (Family Medicine)  Hematological/Oncological History # Classical Hodgkin's Lymphoma, Mixed Cellularity. 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 2) 10/27/2020: resection of neck mass. Pathology showed classical Hodgkin's lymphoma, mixed cellularity subtype 3) 11/13/2020: establish care with Dr. Leonides Schanz  4) 11/26/2020: PET CT scan shows single hypermetabolic unenlarged right level II cervical lymph node identified on today's study. FDG uptake is compatible with Deauville 4. Other small bilateral cervical and upper normal to borderline hepatoduodenal ligament lymph nodes show Deauville 2 uptake levels 5) 12/18/2020: Cycle 1 Day 1 of AVD chemotherapy  6) 01/15/2021: Cycle 2 Day 1 of AVD chemotherapy  7) 02/12/2021: PET CT scan shows no evidence of residual disease 8) 02/15/2021: Cycle 3 Day 1 of AVD chemotherapy  9) 03/01/2021: Cycle 3 Day 15 of AVD chemotherapy  10) 03/15/2021: Cycle 4 Day 1 of AVD chemotherapy  11) 03/29/2021: Cycle 4 Day 15 of AVD chemotherapy 12) 04/12/2021: Cycle 5 Day 1 of AVD chemotherapy 13) 04/26/2021: Cycle 5 Day 15 of AVD chemotherapy 14) 05/10/2021: Cycle 6 Day 1 of AVD chemotherapy 15) 05/24/2021: Cycle 6 Day 15 of AVD chemotherapy 16) 06/21/2021: PET CT scan shows no evidence of residual/recurrent lymphoma. Patient in complete remission, enter surviellance.  17) 10/04/2022: CT chest abdomen pelvis performed, it showed no evidence of residual or recurrent disease. 18) 03/21/2023: CT chest abdomen pelvis performed, it showed no evidence of residual or recurrent disease.  Interval History:  [name] 62 y.o. male with medical history significant for Classical Hodgkin's lymphoma Early Stage/Unfavorable risk who presents for a  follow up visit. The patient's last visit was on 01/06/2023. In the interim since the last visit he has had no major changes in his health.  On exam today [name] reports he has been good overall interim since our last visit.  He reports he is [place] tired today because this visit "interrupted his sleep".  He notes he has been working nights and is not used to being up this early.  He reports that since his prostate surgery has been urinating a lot but this was "to be expected".  He is not having any pain or discoloration of his urine.  He has not noticed any bumps or lumps concerning for lymphadenopathy in his neck, groin, or underarms.  He is not having any fevers, chills, sweats, nausea, vomiting, or diarrhea..  Overall he is at his baseline level of health.  He has no other complaints. A full 10 point ROS is listed below.  MEDICAL HISTORY:  Past Medical History:  Diagnosis Date   Cancer (HCC) 09/2014   recent dx prostate ca-no tx yet   Diabetes mellitus without complication (HCC)    GERD (gastroesophageal reflux disease)    Hodgkin's lymphoma (HCC) 11/2020   Hypertension    Pneumonia    history   Sleep apnea    wears CPAP   Teeth decayed     SURGICAL HISTORY: Past Surgical History:  Procedure Laterality Date   IR IMAGING GUIDED PORT INSERTION  12/11/2020   IR REMOVAL TUN ACCESS W/ PORT W/O FL MOD SED  07/22/2021   LYMPHADENECTOMY Bilateral 07/07/2022   Procedure: LYMPHADENECTOMY, PELVIC;  Surgeon: Heloise Purpura, MD;  Location: [facility];  Service: Urology;  Laterality: Bilateral;   MASS BIOPSY Left 09/29/2014  Procedure: LEFT LOWER NECK MASS EXCISION;  Surgeon: Serena Colonel, MD;  Location: [facility];  Service: ENT;  Laterality: Left;   PAROTIDECTOMY Left 09/29/2014   Procedure: LEFT PAROTIDECTOMY;  Surgeon: Serena Colonel, MD;  Location: [facility];  Service: ENT;  Laterality: Left;   ROBOT ASSISTED LAPAROSCOPIC RADICAL PROSTATECTOMY N/A 07/07/2022    Procedure: XI ROBOTIC ASSISTED LAPAROSCOPIC RADICAL PROSTATECTOMY LEVEL 2;  Surgeon: Heloise Purpura, MD;  Location: [facility];  Service: Urology;  Laterality: N/A;   SEPTOPLASTY  1976    SOCIAL HISTORY: Social History   Socioeconomic History   Marital status: Widowed    Spouse name: Not on file   Number of children: Not on file   Years of education: Not on file   Highest education level: Not on file  Occupational History   Not on file  Tobacco Use   Smoking status: Former    Packs/day: 3.00    Years: 32.00    Additional pack years: 0.00    Total pack years: 96.00    Types: Cigarettes    Quit date: 11/26/2017    Years since quitting: 5.3   Smokeless tobacco: Never  Vaping Use   Vaping Use: Never used  Substance and Sexual Activity   Alcohol use: Yes    Comment: twice a year   Drug use: No   Sexual activity: Yes  Other Topics Concern   Not on file  Social History Narrative   Not on file   Social Determinants of Health   Financial Resource Strain: Not on file  Food Insecurity: Not on file  Transportation Needs: Not on file  Physical Activity: Not on file  Stress: Not on file  Social Connections: Not on file  Intimate Partner Violence: Not on file    FAMILY HISTORY: Family History  Problem Relation Age of Onset   Stroke Mother    Heart disease Father     ALLERGIES:  has No Known Allergies.  MEDICATIONS:  Current Outpatient Medications  Medication Sig Dispense Refill   acetaminophen (TYLENOL) 500 MG tablet Take 1,500-2,000 mg by mouth every 6 (six) hours as needed for headache or moderate pain.     amLODipine (NORVASC) 5 MG tablet Take 5 mg by mouth every evening.     losartan (COZAAR) 100 MG tablet Take 100 mg by mouth every evening.     metFORMIN (GLUCOPHAGE) 500 MG tablet Take 500 mg by mouth 2 (two) times daily with a meal.     pantoprazole (PROTONIX) 40 MG tablet Take 40 mg by mouth every evening.     rosuvastatin (CRESTOR) 5 MG tablet Take 5 mg by mouth  every evening.     No current facility-administered medications for this visit.   Facility-Administered Medications Ordered in Other Visits  Medication Dose Route Frequency Provider Last Rate Last Admin   0.9 %  sodium chloride infusion   Intravenous Continuous Allred, Darrell K, PA-C        REVIEW OF SYSTEMS:   Constitutional: ( - ) fevers, ( - )  chills , ( - ) night sweats Eyes: ( - ) blurriness of vision, ( - ) double vision, ( - ) watery eyes Ears, nose, mouth, throat, and face: ( - ) mucositis, ( - ) sore throat Respiratory: ( - ) cough, ( - ) dyspnea, ( - ) wheezes Cardiovascular: ( - ) palpitation, ( - ) chest discomfort, ( - ) lower extremity swelling Gastrointestinal:  ( - ) nausea, ( - )  heartburn, ( - ) change in bowel habits Skin: ( - ) abnormal skin rashes Lymphatics: ( - ) new lymphadenopathy, ( - ) easy bruising Neurological: ( - ) numbness, ( - ) tingling, ( - ) new weaknesses Behavioral/Psych: ( - ) mood change, ( - ) new changes  All other systems were reviewed with the patient and are negative.  PHYSICAL EXAMINATION: ECOG PERFORMANCE STATUS: 1 - Symptomatic but completely ambulatory  Vitals:   03/31/23 1422  BP: 137/74  Pulse: 79  Resp: 16  Temp: 98.2 F (36.8 C)  SpO2: 97%      Filed Weights   03/31/23 1422  Weight: 249 lb 4.8 oz (113.1 kg)       GENERAL: well appearing middle aged Caucasian male in NAD SKIN: skin color, texture, turgor are normal, no rashes or significant lesions EYES: conjunctiva are pink and non-injected, sclera clear LUNGS: clear to auscultation and percussion with normal breathing effort HEART: regular rate & rhythm and no murmurs and no lower extremity edema Musculoskeletal: no cyanosis of digits and no clubbing  PSYCH: alert & oriented x 3, fluent speech NEURO: no focal motor/sensory deficits  LABORATORY DATA:  I have reviewed the data as listed    Latest Ref Rng & Units 03/31/2023    1:59 PM 01/06/2023    1:56 PM  10/05/2022    2:00 PM  CBC  WBC 4.0 - 10.5 K/uL 6.3  6.5  5.3   Hemoglobin 13.0 - 17.0 g/dL 29.5  62.1  30.8   Hematocrit 39.0 - 52.0 % 43.5  41.8  44.1   Platelets 150 - 400 K/uL 172  168  169        Latest Ref Rng & Units 03/31/2023    1:59 PM 03/21/2023    5:45 PM 01/06/2023    1:56 PM  CMP  Glucose 70 - 99 mg/dL 657   846   BUN 8 - 23 mg/dL 13   14   Creatinine 9.62 - 1.24 mg/dL 9.52  8.41  3.24   Sodium 135 - 145 mmol/L 140   138   Potassium 3.5 - 5.1 mmol/L 3.7   3.6   Chloride 98 - 111 mmol/L 106   104   CO2 22 - 32 mmol/L 26   27   Calcium 8.9 - 10.3 mg/dL 9.4   9.2   Total Protein 6.5 - 8.1 g/dL 7.1   6.9   Total Bilirubin 0.3 - 1.2 mg/dL 0.4   0.3   Alkaline Phos 38 - 126 U/L 56   61   AST 15 - 41 U/L 16   14   ALT 0 - 44 U/L 17   12     No results found for: "MPROTEIN" No results found for: "KPAFRELGTCHN", "LAMBDASER", "KAPLAMBRATIO"   RADIOGRAPHIC STUDIES:  CT CHEST ABDOMEN PELVIS W CONTRAST  Result Date: 03/22/2023 CLINICAL DATA:  62 year old male with history of mixed cellularity Hodgkin's lymphoma of the neck. Additional history of prostate cancer. Follow-up study. * Tracking Code: BO * EXAM: CT CHEST, ABDOMEN, AND PELVIS WITH CONTRAST TECHNIQUE: Multidetector CT imaging of the chest, abdomen and pelvis was performed following the standard protocol during bolus administration of intravenous contrast. RADIATION DOSE REDUCTION: This exam was performed according to the departmental dose-optimization program which includes automated exposure control, adjustment of the mA and/or kV according to patient size and/or use of iterative reconstruction technique. CONTRAST:  OMNIPAQUE IOHEXOL 300 MG/ML  SOLN COMPARISON:  CT of the chest,  abdomen and pelvis 10/04/2022. FINDINGS: CT CHEST FINDINGS Cardiovascular: Heart size is normal. There is no significant pericardial fluid, thickening or pericardial calcification. There is aortic atherosclerosis, as well as atherosclerosis  of the great vessels of the mediastinum and the coronary arteries, including calcified atherosclerotic plaque in the left anterior descending and right coronary arteries. Mediastinum/Nodes: No pathologically enlarged mediastinal or hilar lymph nodes. Esophagus is unremarkable in appearance. No axillary lymphadenopathy. Lungs/Pleura: No suspicious appearing pulmonary nodules or masses are noted. No acute consolidative airspace disease. No pleural effusions. Musculoskeletal: In the subcutaneous fat of the anterior chest wall in the midline (axial image 39 of series 2) there is a well-circumscribed 1.5 cm low-attenuation lesion, stable compared to prior studies, presumably a small sebaceous cyst or other benign lesion. Old healed fractures of the anterolateral left fifth and sixth ribs. There are no aggressive appearing lytic or blastic lesions noted in the visualized portions of the skeleton. CT ABDOMEN PELVIS FINDINGS Hepatobiliary: No suspicious cystic or solid hepatic lesions. Small calcified granulomas are noted in the liver. No intra or extrahepatic biliary ductal dilatation. Gallbladder is nearly completely decompressed, but otherwise unremarkable in appearance. Pancreas: No pancreatic mass. No pancreatic ductal dilatation. No pancreatic or peripancreatic fluid collections or inflammatory changes. Spleen: Multiple calcified granulomas in the spleen incidentally noted. Adrenals/Urinary Tract: Bilateral kidneys and adrenal glands are normal in appearance. No hydroureteronephrosis. Urinary bladder is unremarkable in appearance. Stomach/Bowel: The appearance of the stomach is normal. There is no pathologic dilatation of small bowel or colon. Normal appendix. Vascular/Lymphatic: Aortic atherosclerosis, without evidence of aneurysm or dissection in the abdominal or pelvic vasculature. No lymphadenopathy noted in the abdomen or pelvis. Reproductive: Status post radical prostatectomy. Other: No significant volume of  ascites. No pneumoperitoneum. Supraumbilical ventral hernia which is wide mouth containing short portions of the mid small bowel. Small left inguinal hernia containing only fat. Musculoskeletal: There are no aggressive appearing lytic or blastic lesions noted in the visualized portions of the skeleton. IMPRESSION: 1. No findings to suggest residual or recurrent disease in the chest, abdomen or pelvis on today's examination. 2. Aortic atherosclerosis, in addition to 2 vessel coronary artery disease. Please note that although the presence of coronary artery calcium documents the presence of coronary artery disease, the severity of this disease and any potential stenosis cannot be assessed on this non-gated CT examination. Assessment for potential risk factor modification, dietary therapy or pharmacologic therapy may be warranted, if clinically indicated. 3. Additional incidental findings, as above. Electronically Signed   By: Trudie Reed M.D.   On: 03/22/2023 10:49    ASSESSMENT & PLAN [name] 62 y.o. male with medical history significant for Classical Hodgkin's lymphoma Early Stage/Unfavorable risk who presents for a follow up visit.  After review the labs, the records, review of the PFTs, and review the echocardiogram the findings are most consistent with an early stage/unfavorable risk classical Hodgkin's lymphoma.  Given that he meets unfavorable criteria via multiple guidelines I recommended that we proceed with treatment as recommended in the NCCN guidelines for patients with early stage unfavorable disease (which is the exact same treatment recommended for stage III-IV disease).  Previously we discussed the AVD chemotherapy regimen.  We discussed the expected side effects including possible hair loss, nausea, vomiting, diarrhea, constipation, cytopenias, neutropenia, cardiac dysfunction, and neurological damage.  The patient will be treated with every 2 week rounds of chemotherapy using this  regimen. AVD chemotherapy consists of doxorubicin 25 mg/m on days 1 and 15, vinblastine 6 mg  per metered squared IV on days 1 and 15, and dacarbazine 375 mg/m IV on days 1 and 15.  After 2 cycles of AVD chemotherapy, PET/CT scan from 02/12/2021 revealed complete response to therapy with no evidence of lymphoma. The recommendation is to proceed with a further 4 cycles of AVD therapy.   # Classical Hodgkin's Lymphoma, Mixed Cellularity. 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  --findings were consistent with an Early stage unfavorable risk disease (due to age, ESR elevation, and mixed cellularity based on EORTC, GHSG, and ECGO criteria) --his disease appears early stage with one clear site of involvement in the neck. There are some smaller lymph nodes in the neck and one near the hepatoduodenal ligament, though would favor diagnosis of early stage --based on these criteria the recommendation would be for AVD x 2 cycles with interval PET CT scan followed by AVD x 4 cycles.  --interval PET on 06/21/2021 showed complete response to therapy --we avoided bleomycin in this patient due to smoking history, mild abnormalities on PFTs and age Plan: -- Labs (CBC, CMP, LDH) from today were reviewed without any intervention needed.  -- Labs show white blood cell count 6.3, hemoglobin 14.8, MCV 86.1, and platelets of 172 --CT scan q 12 months. Next scan due April 2025.  Last scan on 03/21/2023 showed no evidence of residual or recurrent disease. --RTC in 6 months   #Supportive Care --chemotherapy education complete --port removed on 07/22/2021.   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 No biopsy recommended.  --continue to monitor on future scans.  #Prostate Cancer, Gleason 3+4=7. S/p resection --s/p robotic assisted laparoscopic  radical prostatectomy on 07/07/2022.  -- Noted to have benign lymph nodes and Prostatic adenocarcinoma, Gleason score 3+4=7  -- Currently being managed by urology service.  No orders of the defined types were placed in this encounter.  All questions were answered. The patient knows to call the clinic with any problems, questions or concerns.  I have spent a total of 30 minutes minutes of face-to-face and non-face-to-face time, preparing to see the patient, obtaining and/or reviewing separately obtained history, performing a medically appropriate examination, counseling and educating the patient, ordering tests,documenting clinical information in the electronic health record and care coordination.   Ulysees Barns, MD Department of Hematology/Oncology [facility] Phone: [phone] Pager: [phone] Email: [email or web address]  03/31/2023 5:14 PM

## 2023-04-06 DIAGNOSIS — H25813 Combined forms of age-related cataract, bilateral: Secondary | ICD-10-CM | POA: Diagnosis not present

## 2023-04-06 DIAGNOSIS — H40013 Open angle with borderline findings, low risk, bilateral: Secondary | ICD-10-CM | POA: Diagnosis not present

## 2023-04-06 DIAGNOSIS — R7309 Other abnormal glucose: Secondary | ICD-10-CM | POA: Diagnosis not present

## 2023-04-06 DIAGNOSIS — H33321 Round hole, right eye: Secondary | ICD-10-CM | POA: Diagnosis not present

## 2023-04-14 ENCOUNTER — Ambulatory Visit: Payer: BC Managed Care – PPO | Admitting: Hematology and Oncology

## 2023-04-14 ENCOUNTER — Other Ambulatory Visit: Payer: BC Managed Care – PPO

## 2023-05-12 DIAGNOSIS — E1169 Type 2 diabetes mellitus with other specified complication: Secondary | ICD-10-CM | POA: Diagnosis not present

## 2023-05-12 DIAGNOSIS — G4733 Obstructive sleep apnea (adult) (pediatric): Secondary | ICD-10-CM | POA: Diagnosis not present

## 2023-05-12 DIAGNOSIS — K429 Umbilical hernia without obstruction or gangrene: Secondary | ICD-10-CM | POA: Diagnosis not present

## 2023-05-12 DIAGNOSIS — M2550 Pain in unspecified joint: Secondary | ICD-10-CM | POA: Diagnosis not present

## 2023-05-12 DIAGNOSIS — E785 Hyperlipidemia, unspecified: Secondary | ICD-10-CM | POA: Diagnosis not present

## 2023-05-12 DIAGNOSIS — Z0000 Encounter for general adult medical examination without abnormal findings: Secondary | ICD-10-CM | POA: Diagnosis not present

## 2023-05-12 DIAGNOSIS — C8190 Hodgkin lymphoma, unspecified, unspecified site: Secondary | ICD-10-CM | POA: Diagnosis not present

## 2023-05-30 DIAGNOSIS — M79642 Pain in left hand: Secondary | ICD-10-CM | POA: Diagnosis not present

## 2023-05-30 DIAGNOSIS — M549 Dorsalgia, unspecified: Secondary | ICD-10-CM | POA: Diagnosis not present

## 2023-05-30 DIAGNOSIS — M1990 Unspecified osteoarthritis, unspecified site: Secondary | ICD-10-CM | POA: Diagnosis not present

## 2023-05-30 DIAGNOSIS — M79643 Pain in unspecified hand: Secondary | ICD-10-CM | POA: Diagnosis not present

## 2023-05-30 DIAGNOSIS — M25562 Pain in left knee: Secondary | ICD-10-CM | POA: Diagnosis not present

## 2023-05-30 DIAGNOSIS — M7989 Other specified soft tissue disorders: Secondary | ICD-10-CM | POA: Diagnosis not present

## 2023-05-30 DIAGNOSIS — M25561 Pain in right knee: Secondary | ICD-10-CM | POA: Diagnosis not present

## 2023-05-30 DIAGNOSIS — M2550 Pain in unspecified joint: Secondary | ICD-10-CM | POA: Diagnosis not present

## 2023-05-30 DIAGNOSIS — M79641 Pain in right hand: Secondary | ICD-10-CM | POA: Diagnosis not present

## 2023-06-13 ENCOUNTER — Ambulatory Visit: Payer: Self-pay | Admitting: Surgery

## 2023-06-13 DIAGNOSIS — G4733 Obstructive sleep apnea (adult) (pediatric): Secondary | ICD-10-CM | POA: Diagnosis not present

## 2023-06-13 DIAGNOSIS — E669 Obesity, unspecified: Secondary | ICD-10-CM | POA: Diagnosis not present

## 2023-06-13 DIAGNOSIS — K4090 Unilateral inguinal hernia, without obstruction or gangrene, not specified as recurrent: Secondary | ICD-10-CM | POA: Diagnosis not present

## 2023-06-13 DIAGNOSIS — K432 Incisional hernia without obstruction or gangrene: Secondary | ICD-10-CM | POA: Diagnosis not present

## 2023-06-14 DIAGNOSIS — C61 Malignant neoplasm of prostate: Secondary | ICD-10-CM | POA: Diagnosis not present

## 2023-06-21 DIAGNOSIS — N393 Stress incontinence (female) (male): Secondary | ICD-10-CM | POA: Diagnosis not present

## 2023-06-21 DIAGNOSIS — C61 Malignant neoplasm of prostate: Secondary | ICD-10-CM | POA: Diagnosis not present

## 2023-06-21 DIAGNOSIS — N5201 Erectile dysfunction due to arterial insufficiency: Secondary | ICD-10-CM | POA: Diagnosis not present

## 2023-07-03 DIAGNOSIS — M549 Dorsalgia, unspecified: Secondary | ICD-10-CM | POA: Diagnosis not present

## 2023-07-03 DIAGNOSIS — M79643 Pain in unspecified hand: Secondary | ICD-10-CM | POA: Diagnosis not present

## 2023-07-03 DIAGNOSIS — M7989 Other specified soft tissue disorders: Secondary | ICD-10-CM | POA: Diagnosis not present

## 2023-07-03 DIAGNOSIS — M1990 Unspecified osteoarthritis, unspecified site: Secondary | ICD-10-CM | POA: Diagnosis not present

## 2023-07-04 ENCOUNTER — Other Ambulatory Visit: Payer: Self-pay | Admitting: Rheumatology

## 2023-07-04 DIAGNOSIS — M461 Sacroiliitis, not elsewhere classified: Secondary | ICD-10-CM

## 2023-07-25 ENCOUNTER — Ambulatory Visit: Admission: RE | Admit: 2023-07-25 | Payer: BC Managed Care – PPO | Source: Ambulatory Visit

## 2023-07-25 DIAGNOSIS — M461 Sacroiliitis, not elsewhere classified: Secondary | ICD-10-CM

## 2023-07-25 DIAGNOSIS — Z8546 Personal history of malignant neoplasm of prostate: Secondary | ICD-10-CM | POA: Diagnosis not present

## 2023-07-25 DIAGNOSIS — M533 Sacrococcygeal disorders, not elsewhere classified: Secondary | ICD-10-CM | POA: Diagnosis not present

## 2023-07-31 DIAGNOSIS — Z79899 Other long term (current) drug therapy: Secondary | ICD-10-CM | POA: Diagnosis not present

## 2023-07-31 DIAGNOSIS — M25569 Pain in unspecified knee: Secondary | ICD-10-CM | POA: Diagnosis not present

## 2023-07-31 DIAGNOSIS — M549 Dorsalgia, unspecified: Secondary | ICD-10-CM | POA: Diagnosis not present

## 2023-07-31 DIAGNOSIS — E669 Obesity, unspecified: Secondary | ICD-10-CM | POA: Diagnosis not present

## 2023-07-31 DIAGNOSIS — M1990 Unspecified osteoarthritis, unspecified site: Secondary | ICD-10-CM | POA: Diagnosis not present

## 2023-08-11 DIAGNOSIS — K436 Other and unspecified ventral hernia with obstruction, without gangrene: Secondary | ICD-10-CM | POA: Diagnosis not present

## 2023-08-11 DIAGNOSIS — K4020 Bilateral inguinal hernia, without obstruction or gangrene, not specified as recurrent: Secondary | ICD-10-CM | POA: Diagnosis not present

## 2023-08-11 DIAGNOSIS — K432 Incisional hernia without obstruction or gangrene: Secondary | ICD-10-CM | POA: Diagnosis not present

## 2023-08-12 ENCOUNTER — Encounter (HOSPITAL_BASED_OUTPATIENT_CLINIC_OR_DEPARTMENT_OTHER): Payer: Self-pay

## 2023-08-12 ENCOUNTER — Other Ambulatory Visit: Payer: Self-pay

## 2023-08-12 ENCOUNTER — Observation Stay (HOSPITAL_BASED_OUTPATIENT_CLINIC_OR_DEPARTMENT_OTHER)
Admission: EM | Admit: 2023-08-12 | Discharge: 2023-08-13 | Disposition: A | Discharge status: Home / Self Care | Payer: BC Managed Care – PPO | Attending: General Surgery | Admitting: General Surgery

## 2023-08-12 DIAGNOSIS — I10 Essential (primary) hypertension: Secondary | ICD-10-CM | POA: Diagnosis not present

## 2023-08-12 DIAGNOSIS — E119 Type 2 diabetes mellitus without complications: Secondary | ICD-10-CM | POA: Insufficient documentation

## 2023-08-12 DIAGNOSIS — Z79899 Other long term (current) drug therapy: Secondary | ICD-10-CM | POA: Diagnosis not present

## 2023-08-12 DIAGNOSIS — G8918 Other acute postprocedural pain: Principal | ICD-10-CM

## 2023-08-12 DIAGNOSIS — Z8546 Personal history of malignant neoplasm of prostate: Secondary | ICD-10-CM | POA: Insufficient documentation

## 2023-08-12 DIAGNOSIS — Z7984 Long term (current) use of oral hypoglycemic drugs: Secondary | ICD-10-CM | POA: Diagnosis not present

## 2023-08-12 DIAGNOSIS — R109 Unspecified abdominal pain: Secondary | ICD-10-CM | POA: Diagnosis not present

## 2023-08-12 DIAGNOSIS — Z87891 Personal history of nicotine dependence: Secondary | ICD-10-CM | POA: Diagnosis not present

## 2023-08-12 DIAGNOSIS — K91840 Postprocedural hemorrhage and hematoma of a digestive system organ or structure following a digestive system procedure: Secondary | ICD-10-CM | POA: Diagnosis not present

## 2023-08-12 LAB — BASIC METABOLIC PANEL
Anion gap: 10 (ref 5–15)
BUN: 18 mg/dL (ref 8–23)
CO2: 26 mmol/L (ref 22–32)
Calcium: 8.6 mg/dL — ABNORMAL LOW (ref 8.9–10.3)
Chloride: 98 mmol/L (ref 98–111)
Creatinine, Ser: 0.96 mg/dL (ref 0.61–1.24)
GFR, Estimated: >60 mL/min (ref >60)
Glucose, Bld: 118 mg/dL — ABNORMAL HIGH (ref 70–99)
Potassium: 3.7 mmol/L (ref 3.5–5.1)
Sodium: 134 mmol/L — ABNORMAL LOW (ref 135–145)

## 2023-08-12 LAB — CBC
HCT: 41.8 % (ref 39.0–52.0)
Hemoglobin: 14.4 g/dL (ref 13.0–17.0)
MCH: 29.8 pg (ref 26.0–34.0)
MCHC: 34.4 g/dL (ref 30.0–36.0)
MCV: 86.5 fL (ref 80.0–100.0)
Platelets: 192 10*3/uL (ref 150–400)
RBC: 4.83 MIL/uL (ref 4.22–5.81)
RDW: 14.2 % (ref 11.5–15.5)
WBC: 13.2 10*3/uL — ABNORMAL HIGH (ref 4.0–10.5)
nRBC: 0 % (ref 0.0–0.2)

## 2023-08-12 MED ORDER — ENOXAPARIN SODIUM 40 MG/0.4ML IJ SOSY
40.0000 mg | PREFILLED_SYRINGE | INTRAMUSCULAR | Status: DC
  Administered 2023-08-12: 40 mg via SUBCUTANEOUS
  Filled 2023-08-12: qty 0.4

## 2023-08-12 MED ORDER — SENNA 8.6 MG PO TABS
1.0000 | ORAL_TABLET | Freq: Two times a day (BID) | ORAL | Status: DC
  Administered 2023-08-12 (×2): 8.6 mg via ORAL
  Filled 2023-08-12 (×2): qty 1

## 2023-08-12 MED ORDER — PANTOPRAZOLE SODIUM 40 MG PO TBEC
40.0000 mg | DELAYED_RELEASE_TABLET | Freq: Every day | ORAL | Status: DC
  Administered 2023-08-12: 40 mg via ORAL
  Filled 2023-08-12: qty 1

## 2023-08-12 MED ORDER — METHOCARBAMOL 1000 MG/10ML IJ SOLN: 500.0000 mg | Freq: Three times a day (TID) | INTRAVENOUS | Status: DC | PRN

## 2023-08-12 MED ORDER — ROSUVASTATIN CALCIUM 5 MG PO TABS
5.0000 mg | ORAL_TABLET | Freq: Every evening | ORAL | Status: DC
  Administered 2023-08-12: 5 mg via ORAL
  Filled 2023-08-12: qty 1

## 2023-08-12 MED ORDER — ACETAMINOPHEN 500 MG PO TABS
1000.0000 mg | ORAL_TABLET | Freq: Four times a day (QID) | ORAL | Status: DC
  Administered 2023-08-12 – 2023-08-13 (×4): 1000 mg via ORAL
  Filled 2023-08-12 (×4): qty 2

## 2023-08-12 MED ORDER — HYDROMORPHONE HCL 1 MG/ML IJ SOLN
1.0000 mg | INTRAMUSCULAR | Status: DC | PRN
  Administered 2023-08-12: 1 mg via INTRAVENOUS
  Filled 2023-08-12: qty 1

## 2023-08-12 MED ORDER — KETOROLAC TROMETHAMINE 30 MG/ML IJ SOLN: 30.0000 mg | Freq: Four times a day (QID) | INTRAMUSCULAR | Status: DC | PRN

## 2023-08-12 MED ORDER — ONDANSETRON 4 MG PO TBDP: 4.0000 mg | ORAL_TABLET | Freq: Four times a day (QID) | ORAL | Status: DC | PRN

## 2023-08-12 MED ORDER — HYDROMORPHONE HCL 1 MG/ML IJ SOLN: 1.0000 mg | INTRAMUSCULAR | Status: DC | PRN

## 2023-08-12 MED ORDER — INSULIN ASPART 100 UNIT/ML IJ SOLN
0.0000 [IU] | Freq: Three times a day (TID) | INTRAMUSCULAR | Status: DC
  Administered 2023-08-12 – 2023-08-13 (×2): 2 [IU] via SUBCUTANEOUS

## 2023-08-12 MED ORDER — OXYCODONE HCL 5 MG PO TABS: 5.0000 mg | ORAL_TABLET | ORAL | Status: DC | PRN

## 2023-08-12 MED ORDER — METHOCARBAMOL 500 MG PO TABS: 500.0000 mg | ORAL_TABLET | Freq: Three times a day (TID) | ORAL | Status: DC | PRN

## 2023-08-12 MED ORDER — ONDANSETRON HCL 4 MG/2ML IJ SOLN: 4.0000 mg | Freq: Four times a day (QID) | INTRAMUSCULAR | Status: DC | PRN

## 2023-08-12 MED ORDER — INSULIN ASPART 100 UNIT/ML IJ SOLN: 0.0000 [IU] | Freq: Every day | INTRAMUSCULAR | Status: DC

## 2023-08-12 MED ORDER — KETOROLAC TROMETHAMINE 30 MG/ML IJ SOLN
30.0000 mg | Freq: Four times a day (QID) | INTRAMUSCULAR | Status: DC
  Administered 2023-08-12 – 2023-08-13 (×4): 30 mg via INTRAVENOUS
  Filled 2023-08-12 (×4): qty 1

## 2023-08-12 MED ORDER — DOCUSATE SODIUM 100 MG PO CAPS
100.0000 mg | ORAL_CAPSULE | Freq: Two times a day (BID) | ORAL | Status: DC
  Administered 2023-08-12 (×2): 100 mg via ORAL
  Filled 2023-08-12 (×2): qty 1

## 2023-08-12 MED ORDER — PANTOPRAZOLE SODIUM 40 MG IV SOLR: 40.0000 mg | Freq: Every day | INTRAVENOUS | Status: DC

## 2023-08-12 MED ORDER — HYDROMORPHONE HCL 1 MG/ML IJ SOLN
1.0000 mg | Freq: Once | INTRAMUSCULAR | Status: AC
  Administered 2023-08-12: 1 mg via INTRAMUSCULAR
  Filled 2023-08-12: qty 1

## 2023-08-12 NOTE — H&P (Signed)
[name] [birth year]  [id number].    HPI:  62 y/o M w/ a hx of HTN, GERD, Hodgkin's lymphoma, prostate cancer s/p prostatectomy who underwent a laparoscopic VHR yesterday and has been admitted today for pain control.  He reports that he was never comfortable at home despite taking the prescribed pain medications. He has been tolerating PO. WBC 13.  He is AF and HDS.    ROS: Review of Systems  Constitutional: Negative.   HENT: Negative.    Eyes: Negative.   Respiratory: Negative.    Cardiovascular: Negative.   Gastrointestinal:  Positive for abdominal pain.  Genitourinary: Negative.   Musculoskeletal: Negative.   Skin: Negative.   Neurological: Negative.   Endo/Heme/Allergies: Negative.   Psychiatric/Behavioral: Negative.      Family History  Problem Relation Age of Onset   Stroke Mother    Heart disease Father     Past Medical History:  Diagnosis Date   Cancer (HCC) 09/2014   recent dx prostate ca-no tx yet   Diabetes mellitus without complication (HCC)    GERD (gastroesophageal reflux disease)    Hodgkin's lymphoma (HCC) 11/2020   Hypertension    Pneumonia    history   Sleep apnea    wears CPAP   Teeth decayed     Past Surgical History:  Procedure Laterality Date   IR IMAGING GUIDED PORT INSERTION  12/11/2020   IR REMOVAL TUN ACCESS W/ PORT W/O FL MOD SED  07/22/2021   LYMPHADENECTOMY Bilateral 07/07/2022   Procedure: LYMPHADENECTOMY, PELVIC;  Surgeon: Heloise Purpura, MD;  Location: [facility];  Service: Urology;  Laterality: Bilateral;   MASS BIOPSY Left 09/29/2014   Procedure: LEFT LOWER NECK MASS EXCISION;  Surgeon: Serena Colonel, MD;  Location: [facility];  Service: ENT;  Laterality: Left;   PAROTIDECTOMY Left 09/29/2014   Procedure: LEFT PAROTIDECTOMY;  Surgeon: Serena Colonel, MD;  Location: [facility];  Service: ENT;  Laterality: Left;   ROBOT ASSISTED LAPAROSCOPIC RADICAL PROSTATECTOMY N/A 07/07/2022   Procedure: XI  ROBOTIC ASSISTED LAPAROSCOPIC RADICAL PROSTATECTOMY LEVEL 2;  Surgeon: Heloise Purpura, MD;  Location: [facility];  Service: Urology;  Laterality: N/A;   SEPTOPLASTY  12/05/1974   UMBILICAL HERNIA REPAIR      Social History:  reports that he quit smoking about 5 years ago. His smoking use included cigarettes. He started smoking about 37 years ago. He has a 96 pack-year smoking history. He has never used smokeless tobacco. He reports current alcohol use. He reports that he does not use drugs.  Allergies: No Known Allergies  Medications Prior to Admission  Medication Sig Dispense Refill   acetaminophen (TYLENOL) 500 MG tablet Take 1,500-2,000 mg by mouth every 6 (six) hours as needed for headache or moderate pain.     amLODipine (NORVASC) 5 MG tablet Take 5 mg by mouth every evening.     losartan (COZAAR) 100 MG tablet Take 100 mg by mouth every evening.     metFORMIN (GLUCOPHAGE) 500 MG tablet Take 500 mg by mouth 2 (two) times daily with a meal.     pantoprazole (PROTONIX) 40 MG tablet Take 40 mg by mouth every evening.     rosuvastatin (CRESTOR) 5 MG tablet Take 5 mg by mouth every evening.      Physical Exam: Blood pressure 118/68, pulse (!) 104, temperature 98.2 F (36.8 C), temperature source Oral, resp. rate 17, height 5\' 10"  (1.778 m), weight 113.4 kg, SpO2 92%. Gen: male, NAD Resp:  no increased WOB CV: RRR Abd: soft, mildly distended, port sites appear clean and dry, midline dressing with old sanguinous staining, no signs of active bleeding, appropriately tender, no peritoneal signs Neuro: moving all extremities  Results for orders placed or performed during the hospital encounter of 08/12/23 (from the past 48 hour(s))  Basic metabolic panel     Status: Abnormal   Collection Time: 08/12/23 10:54 AM  Result Value Ref Range   Sodium 134 (L) 135 - 145 mmol/L   Potassium 3.7 3.5 - 5.1 mmol/L   Chloride 98 98 - 111 mmol/L   CO2 26 22 - 32 mmol/L   Glucose, Bld 118 (H) 70 - 99 mg/dL     Comment: Glucose reference range applies only to samples taken after fasting for at least 8 hours.   BUN 18 8 - 23 mg/dL   Creatinine, Ser 1.61 0.61 - 1.24 mg/dL   Calcium 8.6 (L) 8.9 - 10.3 mg/dL   GFR, Estimated >09 >60 mL/min    Comment: (NOTE) Calculated using the CKD-EPI Creatinine Equation (2021)    Anion gap 10 5 - 15    Comment: Performed at [facility], [address] [place], [place] [address]  CBC     Status: Abnormal   Collection Time: 08/12/23 10:54 AM  Result Value Ref Range   WBC 13.2 (H) 4.0 - 10.5 K/uL   RBC 4.83 4.22 - 5.81 MIL/uL   Hemoglobin 14.4 13.0 - 17.0 g/dL   HCT 81.1 91.4 - 78.2 %   MCV 86.5 80.0 - 100.0 fL   MCH 29.8 26.0 - 34.0 pg   MCHC 34.4 30.0 - 36.0 g/dL   RDW 95.6 21.3 - 08.6 %   Platelets 192 150 - 400 K/uL   nRBC 0.0 0.0 - 0.2 %    Comment: Performed at [facility], [address] [place], [place] [address]   No results found.  Assessment/Plan 62 y/o M POD1 for lap VHR  - Low concern for a significant complication at this time based on his labs and physical exam - Will admit him for IV pain meds to better control his post-operative pain - Regular diet - Resume home meds - Will continue to monitor  I reviewed last 24 h vitals and pain scores, last 24 h labs and trends, and last 24 h imaging results.  [facility] 08/12/2023, 1:50 PM Please see Amion for pager number during day hours 7:00am-4:30pm or 7:00am -11:30am on weekends

## 2023-08-12 NOTE — ED Notes (Signed)
[facility] floor nurse to return call for report

## 2023-08-12 NOTE — Plan of Care (Signed)

## 2023-08-12 NOTE — ED Provider Notes (Addendum)
[facility] AT [facility] Provider Note   CSN: [id number] Arrival date & time: 08/12/23  7846     History  Chief Complaint  Patient presents with   Incisional bleeding    [name] is a 62 y.o. male with a history of obesity, ventral surgical repair with mesh placement yesterday, presenting to the ED with complaint of bleeding around the surgical site abdominal pain.  Patient reports his pain has been intractable despite the 5 mg oxycodones which he was prescribed.  He said last night he woke up in his chair felt wet and he felt that he was bleeding around the surgical site.  He said he tried to call the surgeons office and was told it was the weekend and he needs to come to the ER.  HPI     Home Medications Prior to Admission medications   Medication Sig Start Date End Date Taking? Authorizing Provider  acetaminophen (TYLENOL) 500 MG tablet Take 1,500-2,000 mg by mouth every 6 (six) hours as needed for headache or moderate pain.    [provider]  amLODipine (NORVASC) 5 MG tablet Take 5 mg by mouth every evening.    [provider]  losartan (COZAAR) 100 MG tablet Take 100 mg by mouth every evening. 11/01/20   [provider]  metFORMIN (GLUCOPHAGE) 500 MG tablet Take 500 mg by mouth 2 (two) times daily with a meal. 08/22/19   [provider]  pantoprazole (PROTONIX) 40 MG tablet Take 40 mg by mouth every evening. 08/14/19   [provider]  rosuvastatin (CRESTOR) 5 MG tablet Take 5 mg by mouth every evening. 10/28/20   [provider]      Allergies    Patient has no known allergies.    Review of Systems   Review of Systems  Physical Exam Updated Vital Signs BP (!) 139/92 (BP Location: Right Arm)   Pulse 100   Temp 98.2 F (36.8 C) (Oral)   Resp 17   Ht 5\' 10"  (1.778 m)   Wt 113.4 kg   SpO2 94%   BMI 35.87 kg/m  Physical Exam Constitutional:      General: He is not in acute  distress.    Appearance: He is obese.  HENT:     Head: Normocephalic and atraumatic.  Eyes:     Conjunctiva/sclera: Conjunctivae normal.     Pupils: Pupils are equal, round, and reactive to light.  Cardiovascular:     Rate and Rhythm: Normal rate and regular rhythm.  Pulmonary:     Effort: Pulmonary effort is normal. No respiratory distress.  Abdominal:     General: There is no distension.     Tenderness: There is no abdominal tenderness.     Comments: Abdominal surgical incision site with clear dressings as visualized, no active bleeding or laparoscopic port sites are from the abdominal incision, see photo  Skin:    General: Skin is warm and dry.  Neurological:     General: No focal deficit present.     Mental Status: He is alert. Mental status is at baseline.  Psychiatric:        Mood and Affect: Mood normal.        Behavior: Behavior normal.        ED Results / Procedures / Treatments   Labs (all labs ordered are listed, but only abnormal results are displayed) Labs Reviewed  BASIC METABOLIC PANEL  CBC    EKG None  Radiology No  results found.  Procedures Procedures    Medications Ordered in ED Medications  HYDROmorphone (DILAUDID) injection 1 mg (1 mg Intramuscular Given 08/12/23 1027)    ED Course/ Medical Decision Making/ A&P Clinical Course as of 08/12/23 1119  Sat Aug 12, 2023  1059 I spoke to [place] from general surgery who discussed the case with the surgical team and advised admission to gen surgery service at [facility] for pain control.  Patient in agreement [MT]    Clinical Course User Index [MT] Jerimiah Wolman, Kermit Balo, MD                                 Medical Decision Making Amount and/or Complexity of Data Reviewed Labs: ordered.  Risk Prescription drug management. Decision regarding hospitalization.  Patient is here with postoperative pain around his abdominal incision site.  We did not see any overt evidence of infection or active  hemorrhage at this time.  The case were discussed by phone with the general surgery team who advised the patient to be admitted to the hospital on their service for pain control, as they report that these sensations can cause significant pain, and they will assess the patient upon his arrival at the hospital.  The patient is in agreement with this plan.  Basic labs been ordered as well as additional pain medications as needed.  At this time I do not see an indication for an emergent CT scan.  Have a low suspicion for postoperative infection, abscess, or bowel obstruction.  Labs reviewed by myself.  Patient has a minor leukocytosis may be reactive after a large surgery so recently.  BMP is largely unremarkable.       Final Clinical Impression(s) / ED Diagnoses Final diagnoses:  Post-operative pain  Abdominal pain, unspecified abdominal location    Rx / DC Orders ED Discharge Orders     None         Terald Sleeper, MD 08/12/23 1119    Terald Sleeper, MD 08/12/23 1324

## 2023-08-12 NOTE — ED Notes (Signed)
Greggory Stallion at [facility] will send transport when available. Bed Ready at [facility] 3E Rm#[id number].-ABB(NS)

## 2023-08-12 NOTE — ED Triage Notes (Signed)
He cites ventral herniorrhaphy with mesh yesterday by Dr. Michaell Cowing at a local surgical center. He is here with c/o pain at mid abd. And some bleeding at site. His abd. Wound remains covered with op-site dressing and gauze, which does show some small to mod. bloody drainage.

## 2023-08-13 LAB — CBC
HCT: 40.9 % (ref 39.0–52.0)
Hemoglobin: 13.6 g/dL (ref 13.0–17.0)
MCH: 29.6 pg (ref 26.0–34.0)
MCHC: 33.3 g/dL (ref 30.0–36.0)
MCV: 89.1 fL (ref 80.0–100.0)
Platelets: 147 10*3/uL — ABNORMAL LOW (ref 150–400)
RBC: 4.59 MIL/uL (ref 4.22–5.81)
RDW: 14.1 % (ref 11.5–15.5)
WBC: 8.8 10*3/uL (ref 4.0–10.5)
nRBC: 0 % (ref 0.0–0.2)

## 2023-08-13 LAB — BASIC METABOLIC PANEL
Anion gap: 10 (ref 5–15)
BUN: 25 mg/dL — ABNORMAL HIGH (ref 8–23)
CO2: 26 mmol/L (ref 22–32)
Calcium: 8.7 mg/dL — ABNORMAL LOW (ref 8.9–10.3)
Chloride: 97 mmol/L — ABNORMAL LOW (ref 98–111)
Creatinine, Ser: 1.2 mg/dL (ref 0.61–1.24)
GFR, Estimated: >60 mL/min (ref >60)
Glucose, Bld: 129 mg/dL — ABNORMAL HIGH (ref 70–99)
Potassium: 3.3 mmol/L — ABNORMAL LOW (ref 3.5–5.1)
Sodium: 133 mmol/L — ABNORMAL LOW (ref 135–145)

## 2023-08-13 LAB — MAGNESIUM: Magnesium: 2 mg/dL (ref 1.7–2.4)

## 2023-08-13 LAB — PHOSPHORUS: Phosphorus: 3.2 mg/dL (ref 2.5–4.6)

## 2023-08-13 NOTE — Discharge Summary (Signed)
Physician Discharge Summary  Patient ID: [name] MRN: [id number] DOB/AGE: [birth year] 63 y.o.  Admit date: 08/12/2023 Discharge date: 08/13/2023  Admission Diagnoses: Abdominal pain  Discharge Diagnoses:  Principal Problem:   Abdominal pain   Discharged Condition: stable  Hospital Course: Patient was admitted POD 1 from a lap ventral and inguinal hernia repair. His labs, and exam were consistent with expected post-operative findings.  He was admitted overnight for pain control.  On HD 2 his pain was significantly improved.  He discharged to home in stable condition and was hemodynamically stable, tolerating a diet, ambulating at baseline, and taking only oral pain meds for relief.   Consults: None  Significant Diagnostic Studies: WBC 13-->8  Treatments: Pain control   Discharge Exam: Blood pressure 119/75, pulse (!) 105, temperature 98.4 F (36.9 C), temperature source Oral, resp. rate 18, height 5\' 10"  (1.778 m), weight 113.4 kg, SpO2 92%. See progress note  Disposition: Discharge disposition: 01-Home or Self Care        Allergies as of 08/13/2023   No Known Allergies      Medication List     TAKE these medications    amLODipine 5 MG tablet Commonly known as: NORVASC Take 5 mg by mouth every evening.   folic acid 1 MG tablet Commonly known as: FOLVITE Take 1 mg by mouth daily.   losartan 100 MG tablet Commonly known as: COZAAR Take 100 mg by mouth every evening.   metFORMIN 500 MG tablet Commonly known as: GLUCOPHAGE Take 500 mg by mouth 2 (two) times daily with a meal.   methotrexate 2.5 MG tablet Commonly known as: RHEUMATREX Take 20 mg by mouth once a week.   pantoprazole 40 MG tablet Commonly known as: PROTONIX Take 40 mg by mouth every evening.   predniSONE 5 MG tablet Commonly known as: DELTASONE Take 5 mg by mouth daily.   rosuvastatin 5 MG tablet Commonly known as: CRESTOR Take 5 mg by mouth every evening.          Signed: Moise Boring 08/13/2023, 9:08 AM

## 2023-08-13 NOTE — Plan of Care (Signed)

## 2023-08-13 NOTE — Progress Notes (Signed)
Assessment unchanged. Pt verbalized understanding of dc instructions including medications and follow up care. Discharged to front entrance accompanied by NT.

## 2023-08-13 NOTE — Discharge Instructions (Signed)
Home Care After Hernia Repair   Activity  Limit activity for the first 24 hours, then you may return to normal daily activities. Returning to normal daily activities as soon as you can following surgery will enhance recovery time.  No heavy lifting pushing or pulling, anything heavier than 10 pounds (gallon of milk weighs approx. 8.8 pounds) for 4-6 weeks from surgery date.  Do not mow the lawn, use a vacuum cleaner, or do any other strenuous activities without first consulting your surgical team.  Climb stairs slowly and watch your step.  Walk as often as you feel able to increase strength and endurance.  No driving or operating heavy machinery within 24 hours of taking narcotic pain medication.  Abdominal Core Rehab if referral placed for you.  If you think you could benefit from rehab please talk with your surgeon.  For additional information about your recovery and activities you can do to help your recover, please download the [place] [facility] [employer] [facility]) app to your phone and follow the instructions.  The app can be found by clicking one of the links below, by entering [facility]' in your Appstore, or by opening your camera app and following one of the links in the QR codes below:  Diet  Drink plenty of fluids and eat a light meal on the night of surgery. Some patients may find their appetite is poor for a week or two after surgery. This is a normal result of the stress of surgery-your appetite will return in time.   There are no specific diet restrictions after surgery.  Dressings and Wound Care  Keep your wound or incision site clean and dry.  You may have different types of dressings covering your incisions depending on your operation and your surgeon: o Dermabond/Durabond (skin glue): This will usually remain in place for 10-14 days, then naturally fall off your skin. You may take a shower 24 hrs after surgery, carefully wash, not scrub the incision site with a mild  non-scented soap. Pat dry with a soft towel.  Do not pick or peel skin glue off. o Combined outer dressing and inner steri-strips: The outer dressing is typically a plastic film that has a small bandage attached to it. This outer dressing can be removed two days after your operation. For example, if your operation was on Monday, the outer dressing can be removed Wednesday. This should be done for each site you have an incision. Once removed, you will expose the inner steri-strip which looks like a paper strip. Leave these on for about two weeks until they start to peel and ten you can take them off.  o Steri-strips alone: Leave these on for about two weeks until they start to peel and then you can take them off.   You can shower and let the water fall on the dressings above. Do not soak or submerge your incision(s) in a bath tub, hot tub, or swimming pool, until your doctor says it is ok to do so or the incision(s) have completely healed, usually about 2-4 weeks.  Do not use creams, powder, salves or balms on your incision(s).    What to Expect After Surgery   Moderate discomfort controlled with medications  Minimal drainage from incision  You may feel pain in one or both shoulders. This pain comes from the gas still left in your belly after the surgery, if you had laparoscopic surgery (several small incisions). The pain should ease over several days to a  week. Ambulation will help with this pain.   Belly swelling  Feeling fatigue and weak  Constipation after surgery is common. Drink plenty fluids and eat a high fiber diet.  Swelling - In some patients might feel that their hernia has returned after surgery-DO NOT Worry this is normal. Swelling may be due to the development of a seroma. Seroma is fluid that has built up where the hernia was repaired this is a normal result after surgery and it will slowly reabsorb back into your body over the next several weeks.   (MALE PATIENTS ONLY)-esp following  inguinal hernia repair, it is expected that your scrotum may be slightly swollen or tender. Along with oral NSAIDS medications you can apply ice packs, wear compression shorts, and/or elevate scrotum using a rolled up towel.  Also, a bladder catheter may have been placed during your surgery.  Because of this, it may hurt to urinate for a couple of days or you may pass some blood clots. These issues are expected and will go away after a few days. Please notify surgeon or report to Emergency Dept. if you are unable to urinate or your symptoms worsen.    Pain Control: Prescribed Non-Narcotic Pain Medication  You will be given three prescriptions.  Two of them will be for prescription strength ibuprofen (i.e. Advil) and prescription strength acetaminophen (i.e. Tylenol).  The vast majority of patients will just need these two medications.  One prescription will be for a 'rescue' prescription of an oral narcotic (oxycodone).  You may fill this if needed.  You will alternate taking the ibuprofen (600mg ) every 6 hours and also the acetaminophen (650mg ) every 6 hours so that you are taking one of those medications every 3 hours.  For example: o 0800 - take ibuprofen 600mg  o 1100 - take acetaminophen 650mg  o 1400 - take ibuprofen 600mg  o 1700 - take acetaminophen 650mg  o Etc.  Continue taking this alternating pattern of ibuprofen and acetaminophen for 3 days  If you cannot take one or the other of these medications, just take the one you can every 6 hours.  If you are comfortable at night, you don't have to wake up and take a medication.  If you are still uncomfortable after taking either ibuprofen or acetaminophen, try gentle stretching exercise and ice packs (a bag of frozen vegetables works great).  If you are still uncomfortable, you may fill the narcotic prescription of Oxycodone and take as directed.  Once you have completed these prescriptions, your pain level should be low enough to stop taking  medications altogether or just use an over the counter medication (ibuprofen or acetaminophen) as needed.   Pain Control: Over the Counter Medications to take as needed:  Colace/Docusate: May be prescribed by your surgeon to prevent constipation caused by the combination of narcotics, effects of anesthesia, and decreased ambulation.  Hold for loose stools or diarrhea. Take 100 mg 1-2 times a day starting tonight.   Fiber: High fiber foods, extra liquids (water 9-13 cups/day) can also assist with constipation. Examples of high fiber foods are fruit, bran. Prune juice and water are also good liquids to drink.  Milk of Magnesia/Miralax:  If constipated despite take the over the counter stool softeners, you may take Milk of Magnesia or Miralax as directed on bottle to assist with constipation.     Pepcid/Famotidine: May be prescribed while taking naproxen (Aleve) or other NSAIDs such as ibuprofen (Motrin/Advil) to prevent stomach upset or Acid-reflux symptoms. Take 1 tablet  1-2 times a day.   **Constipation: The first bowel movement may occur anywhere between 1-5 days after surgery.  As long as you are not nauseated or not having significant abdominal pain this variation is acceptable.  Narcotic pain medications can cause constipation increasing discomfort; early discontinuation will assist with bowel management.If constipated despite taking stool softeners, you may take Milk of Magnesia or Miralax as directed on the bottle.     **Home medications: You may restart your home medications as directed by your respective Primary Care Physician or Surgeon.  When to notify your Doctor or Healthcare Team:   Sign of Wound Infection   Fever over 100 degrees.  Wound becomes extremely swollen, shows red streaks, warm to the touch, and/or drainage from the incision site or foul-smelling drainage.  Wound edges separate or opens up  Bleeding or bruising   If you have bleeding, apply pressure to the site and hold  the pressure firmly for 5 minutes. If the bleeding continues, apply pressure again and call 911. If the bleeding stopped, call your doctor to report it.   Call your doctor or nurse if you have increased bleeding from your site and increased bruising or a lump forms or gets larger under your skin at the site.  Unrelieved Pain    Call your doctor or nurse if your pain gets worse or is not eased 1 hour after taking your pain medicine, or if it is severe and uncontrolled.  Nausea and Vomiting   Call your doctor or nurse if you have nausea and vomiting that continues more than 24 hours, will not let you keep medicine down and will not let you keep fluids down  Fever, Flu-like symptoms   Fever over 100 degrees and/or chills  Gastrointestinal Bleeding Symptoms    Black tarry bowel movements.  This can be normal after surgery on the stomach, but should resolve in a day or two.    Call 911 if you suddenly have signs of blood loss such as:  Vomiting blood  Fast heart rate  Feeling faint, sweaty, or blacking out  Passing bright red blood from your rectum  Blood Clot Symptoms   Tender, swollen or reddened areas in your calf muscle or thighs.  Numbness or tingling in your lower leg or calf, or at the top of your leg or groin  Skin on your leg looks pale or blue or feels cold to touch  Chest pain or have trouble breathing, lightheadedness, fast heart rate  Sudden Onset of Symptoms    Call 911 if you suddenly have:  Leg weakness and spasm  Loss of bladder or bowel function  Seizure  Confusion, severe headache, dizziness or feeling unsteady, problems talking, difficulty swallowing, and/or numbness or muscle weakness as these could be signs of a stroke.   Follow up Appointment Your follow up appointment should be scheduled 2-3 weeks after your surgery date.  If you have not previously scheduled for a follow-up visit you can be scheduled by contacting [phone].

## 2023-08-13 NOTE — Progress Notes (Signed)
       Subjective: Reports that his pain has improved significantly.  No complaints this morning.   ROS: See above, otherwise other systems negative  Objective: Vital signs in last 24 hours: Temp:  [98.2 F (36.8 C)-98.5 F (36.9 C)] 98.4 F (36.9 C) (09/08 0534) Pulse Rate:  [88-105] 105 (09/08 0534) Resp:  [17-18] 18 (09/08 0534) BP: (106-139)/(62-92) 119/75 (09/08 0534) SpO2:  [92 %-95 %] 92 % (09/08 0534) Weight:  [113.4 kg] 113.4 kg (09/07 0949) Last BM Date : 08/12/23  Intake/Output from previous day: 09/07 0701 - 09/08 0700 In: 480 [P.O.:480] Out: -  Intake/Output this shift: No intake/output data recorded.  PE: Gen: male, NAD Resp: no increased WOB CV: RRR Abd: soft, non-distended, port sites clean and dry, midline incision w/ minimal clear drainage, appropriately tender  Lab Results:  Recent Labs    08/12/23 1054 08/13/23 0415  WBC 13.2* 8.8  HGB 14.4 13.6  HCT 41.8 40.9  PLT 192 147*   BMET Recent Labs    08/12/23 1054 08/13/23 0415  NA 134* 133*  K 3.7 3.3*  CL 98 97*  CO2 26 26  GLUCOSE 118* 129*  BUN 18 25*  CREATININE 0.96 1.20  CALCIUM 8.6* 8.7*   PT/INR No results for input(s): "LABPROT", "INR" in the last 72 hours. CMP     Component Value Date/Time   NA 133 (L) 08/13/2023 0415   K 3.3 (L) 08/13/2023 0415   CL 97 (L) 08/13/2023 0415   CO2 26 08/13/2023 0415   GLUCOSE 129 (H) 08/13/2023 0415   BUN 25 (H) 08/13/2023 0415   CREATININE 1.20 08/13/2023 0415   CREATININE 1.07 03/31/2023 1359   CALCIUM 8.7 (L) 08/13/2023 0415   PROT 7.1 03/31/2023 1359   ALBUMIN 4.5 03/31/2023 1359   AST 16 03/31/2023 1359   ALT 17 03/31/2023 1359   ALKPHOS 56 03/31/2023 1359   BILITOT 0.4 03/31/2023 1359   GFRNONAA >60 08/13/2023 0415   GFRNONAA >60 03/31/2023 1359   GFRAA >90 09/23/2014 1315   Lipase     Component Value Date/Time   LIPASE 67 **Please note change in reference range.** 09/05/2008 1800    Studies/Results: No results  found.  Anti-infectives: Anti-infectives (From admission, onward)    None       Assessment/Plan 62 y/o M POD 2 from lap ventral and inguinal hernia repair who presented with pain but has reassuring imaging and labs and is now significantly improved.  - Will plan for discharge today - Follow up as previously scheduled   I reviewed last 24 h vitals and pain scores, last 24 h labs and trends, and last 24 h imaging results.   LOS: 0 days   Tacy [facility] 08/13/2023, 8:25 AM Please see Amion for pager number during day hours 7:00am-4:30pm or 7:00am -11:30am on weekends

## 2023-08-13 NOTE — TOC Initial Note (Signed)
Transition of Care [facility]) - Initial/Assessment Note    Patient Details  Name: [name] MRN: [id number] Date of Birth: 1960-12-22  Transition of Care [facility]) CM/SW Contact:    Adrian Prows, RN Phone Number: 08/13/2023, 10:13 AM  Clinical Narrative:                 Spoke w/ pt in room; pt says he is from home and plans to return at d/c; pt verifies he ans PCP and insurance; he identified POC sister Azari Kaden [phone]; pt denies SDOH risks; he has transportation; pt says he has glasses; he does not have DME, HH services, or home oxygen; no TOC needs.  Expected Discharge Plan: Home/Self Care Barriers to Discharge: No Barriers Identified   Patient Goals and CMS Choice Patient states their goals for this hospitalization and ongoing recovery are:: home   Choice offered to / list presented to : NA      Expected Discharge Plan and Services In-house Referral: NA Discharge Planning Services: CM Consult Post Acute Care Choice: NA Living arrangements for the past 2 months: Single Family Home Expected Discharge Date: 08/13/23               DME Arranged: N/A DME Agency: NA       HH Arranged: NA HH Agency: NA        Prior Living Arrangements/Services Living arrangements for the past 2 months: Single Family Home Lives with:: Roommate Patient language and need for interpreter reviewed:: Yes Do you feel safe going back to the place where you live?: Yes      Need for Family Participation in Patient Care: Yes (Comment) Care giver support system in place?: Yes (comment) Current home services:  (n/a) Criminal Activity/Legal Involvement Pertinent to Current Situation/Hospitalization: No - Comment as needed  Activities of Daily Living Home Assistive Devices/Equipment: CBG Meter, Eyeglasses ADL Screening (condition at time of admission) Patient's cognitive ability adequate to safely complete daily activities?: Yes Is the patient deaf or have difficulty hearing?:  No Does the patient have difficulty seeing, even when wearing glasses/contacts?: No Does the patient have difficulty concentrating, remembering, or making decisions?: No Patient able to express need for assistance with ADLs?: Yes Does the patient have difficulty dressing or bathing?: No Independently performs ADLs?: Yes (appropriate for developmental age) Does the patient have difficulty walking or climbing stairs?: No Weakness of Legs: None Weakness of Arms/Hands: None  Permission Sought/Granted Permission sought to share information with : Case Manager Permission granted to share information with : Yes, Verbal Permission Granted  Share Information with NAME: Case Manager     Permission granted to share info w Relationship: Kyre Olshansky (sister) [phone]     Emotional Assessment Appearance:: Appears stated age Attitude/Demeanor/Rapport: Gracious Affect (typically observed): Accepting Orientation: : Oriented to Self, Oriented to Place, Oriented to  Time, Oriented to Situation Alcohol / Substance Use: Not Applicable Psych Involvement: No (comment)  Admission diagnosis:  Post-operative pain [G89.18] Abdominal pain [R10.9] Abdominal pain, unspecified abdominal location [R10.9] Patient Active Problem List   Diagnosis Date Noted   Abdominal pain 08/12/2023   Prostate cancer (HCC) 07/07/2022   Insomnia 05/10/2021   Encounter for antineoplastic chemotherapy 04/26/2021   Port-A-Cath in place 04/12/2021   Neutropenia (HCC) 01/18/2021   Neutropenic fever (HCC) 01/17/2021   Severe sepsis (HCC) 01/17/2021   Thrombocytopenia (HCC) 01/17/2021   HTN (hypertension) 01/17/2021   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 [place] [address] Phone: [phone] Fax: [phone]     Social Determinants of Health (SDOH) Social History: SDOH Screenings   Food Insecurity: No Food Insecurity (08/13/2023)  Housing: Low Risk  (08/13/2023)  Transportation Needs: No Transportation Needs (08/13/2023)  Utilities: Not At Risk (08/13/2023)  Tobacco Use: Medium Risk (08/12/2023)   SDOH Interventions: Food Insecurity Interventions: Intervention Not Indicated, Inpatient TOC Housing Interventions: Intervention Not Indicated, Inpatient TOC Transportation Interventions: Intervention Not Indicated, Inpatient TOC Utilities Interventions: Intervention Not Indicated, Inpatient TOC   Readmission Risk Interventions     No data to display

## 2023-08-13 NOTE — Plan of Care (Signed)

## 2023-08-14 LAB — GLUCOSE, CAPILLARY
Glucose-Capillary: 147 mg/dL — ABNORMAL HIGH (ref 70–99)
Glucose-Capillary: 123 mg/dL — ABNORMAL HIGH (ref 70–99)
Glucose-Capillary: 144 mg/dL — ABNORMAL HIGH (ref 70–99)

## 2023-09-26 DIAGNOSIS — G4733 Obstructive sleep apnea (adult) (pediatric): Secondary | ICD-10-CM | POA: Diagnosis not present

## 2023-09-27 DIAGNOSIS — Z79899 Other long term (current) drug therapy: Secondary | ICD-10-CM | POA: Diagnosis not present

## 2023-09-27 DIAGNOSIS — M2550 Pain in unspecified joint: Secondary | ICD-10-CM | POA: Diagnosis not present

## 2023-09-27 DIAGNOSIS — M1990 Unspecified osteoarthritis, unspecified site: Secondary | ICD-10-CM | POA: Diagnosis not present

## 2023-09-27 DIAGNOSIS — M0609 Rheumatoid arthritis without rheumatoid factor, multiple sites: Secondary | ICD-10-CM | POA: Diagnosis not present

## 2023-09-29 ENCOUNTER — Inpatient Hospital Stay: Payer: BC Managed Care – PPO | Attending: Hematology and Oncology

## 2023-09-29 ENCOUNTER — Other Ambulatory Visit: Payer: Self-pay | Admitting: Hematology and Oncology

## 2023-09-29 ENCOUNTER — Inpatient Hospital Stay (HOSPITAL_BASED_OUTPATIENT_CLINIC_OR_DEPARTMENT_OTHER): Payer: BC Managed Care – PPO | Admitting: Hematology and Oncology

## 2023-09-29 ENCOUNTER — Other Ambulatory Visit: Payer: Self-pay

## 2023-09-29 VITALS — BP 150/76 | HR 81 | Temp 98.5°F | Resp 16 | Wt 255.5 lb

## 2023-09-29 DIAGNOSIS — C8121 Mixed cellularity classical Hodgkin lymphoma, lymph nodes of head, face, and neck: Secondary | ICD-10-CM

## 2023-09-29 DIAGNOSIS — Z9079 Acquired absence of other genital organ(s): Secondary | ICD-10-CM | POA: Insufficient documentation

## 2023-09-29 DIAGNOSIS — C61 Malignant neoplasm of prostate: Secondary | ICD-10-CM | POA: Diagnosis not present

## 2023-09-29 DIAGNOSIS — Z8249 Family history of ischemic heart disease and other diseases of the circulatory system: Secondary | ICD-10-CM | POA: Diagnosis not present

## 2023-09-29 DIAGNOSIS — C8120 Mixed cellularity classical Hodgkin lymphoma, unspecified site: Secondary | ICD-10-CM | POA: Insufficient documentation

## 2023-09-29 DIAGNOSIS — Z79631 Long term (current) use of antimetabolite agent: Secondary | ICD-10-CM | POA: Diagnosis not present

## 2023-09-29 DIAGNOSIS — Z79899 Other long term (current) drug therapy: Secondary | ICD-10-CM | POA: Diagnosis not present

## 2023-09-29 DIAGNOSIS — Z823 Family history of stroke: Secondary | ICD-10-CM | POA: Diagnosis not present

## 2023-09-29 DIAGNOSIS — Z87891 Personal history of nicotine dependence: Secondary | ICD-10-CM | POA: Insufficient documentation

## 2023-09-29 DIAGNOSIS — Z95828 Presence of other vascular implants and grafts: Secondary | ICD-10-CM

## 2023-09-29 LAB — CBC WITH DIFFERENTIAL (CANCER CENTER ONLY)
Abs Immature Granulocytes: 0.01 10*3/uL (ref 0.00–0.07)
Basophils Absolute: 0.1 10*3/uL (ref 0.0–0.1)
Basophils Relative: 1 %
Eosinophils Absolute: 0.2 10*3/uL (ref 0.0–0.5)
Eosinophils Relative: 3 %
HCT: 37.4 % — ABNORMAL LOW (ref 39.0–52.0)
Hemoglobin: 12.7 g/dL — ABNORMAL LOW (ref 13.0–17.0)
Immature Granulocytes: 0 %
Lymphocytes Relative: 19 %
Lymphs Abs: 1.2 10*3/uL (ref 0.7–4.0)
MCH: 29.9 pg (ref 26.0–34.0)
MCHC: 34 g/dL (ref 30.0–36.0)
MCV: 88 fL (ref 80.0–100.0)
Monocytes Absolute: 0.6 10*3/uL (ref 0.1–1.0)
Monocytes Relative: 9 %
Neutro Abs: 4.4 10*3/uL (ref 1.7–7.7)
Neutrophils Relative %: 68 %
Platelet Count: 228 10*3/uL (ref 150–400)
RBC: 4.25 MIL/uL (ref 4.22–5.81)
RDW: 14 % (ref 11.5–15.5)
WBC Count: 6.5 10*3/uL (ref 4.0–10.5)
nRBC: 0 % (ref 0.0–0.2)

## 2023-09-29 LAB — CMP (CANCER CENTER ONLY)
ALT: 12 U/L (ref 0–44)
AST: 12 U/L — ABNORMAL LOW (ref 15–41)
Albumin: 4.3 g/dL (ref 3.5–5.0)
Alkaline Phosphatase: 58 U/L (ref 38–126)
Anion gap: 8 (ref 5–15)
BUN: 10 mg/dL (ref 8–23)
CO2: 28 mmol/L (ref 22–32)
Calcium: 9.2 mg/dL (ref 8.9–10.3)
Chloride: 103 mmol/L (ref 98–111)
Creatinine: 1.09 mg/dL (ref 0.61–1.24)
GFR, Estimated: >60 mL/min (ref >60)
Glucose, Bld: 108 mg/dL — ABNORMAL HIGH (ref 70–99)
Potassium: 3.6 mmol/L (ref 3.5–5.1)
Sodium: 139 mmol/L (ref 135–145)
Total Bilirubin: 0.4 mg/dL (ref 0.3–1.2)
Total Protein: 7 g/dL (ref 6.5–8.1)

## 2023-09-29 LAB — LACTATE DEHYDROGENASE: LDH: 121 U/L (ref 98–192)

## 2023-09-29 NOTE — Progress Notes (Unsigned)
[facility] [phone]   [phone]  PROGRESS NOTE  Patient Care Team: Farris Has, MD as PCP - General (Family Medicine) Karie Soda, MD as Consulting Physician (General Surgery) Jaci Standard, MD as Consulting Physician (Hematology and Oncology)  Hematological/Oncological History # Classical Hodgkin's Lymphoma, Mixed Cellularity. 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 2) 10/27/2020: resection of neck mass. Pathology showed classical Hodgkin's lymphoma, mixed cellularity subtype 3) 11/13/2020: establish care with Dr. Leonides Schanz  4) 11/26/2020: PET CT scan shows single hypermetabolic unenlarged right level II cervical lymph node identified on today's study. FDG uptake is compatible with Deauville 4. Other small bilateral cervical and upper normal to borderline hepatoduodenal ligament lymph nodes show Deauville 2 uptake levels 5) 12/18/2020: Cycle 1 Day 1 of AVD chemotherapy  6) 01/15/2021: Cycle 2 Day 1 of AVD chemotherapy  7) 02/12/2021: PET CT scan shows no evidence of residual disease 8) 02/15/2021: Cycle 3 Day 1 of AVD chemotherapy  9) 03/01/2021: Cycle 3 Day 15 of AVD chemotherapy  10) 03/15/2021: Cycle 4 Day 1 of AVD chemotherapy  11) 03/29/2021: Cycle 4 Day 15 of AVD chemotherapy 12) 04/12/2021: Cycle 5 Day 1 of AVD chemotherapy 13) 04/26/2021: Cycle 5 Day 15 of AVD chemotherapy 14) 05/10/2021: Cycle 6 Day 1 of AVD chemotherapy 15) 05/24/2021: Cycle 6 Day 15 of AVD chemotherapy 16) 06/21/2021: PET CT scan shows no evidence of residual/recurrent lymphoma. Patient in complete remission, enter surviellance.  17) 10/04/2022: CT chest abdomen pelvis performed, it showed no evidence of residual or recurrent disease. 18) 03/21/2023: CT chest abdomen pelvis performed, it showed no evidence of residual or recurrent disease.  Interval History:  [name] 62 y.o. male with medical history significant for Classical Hodgkin's lymphoma Early Stage/Unfavorable risk who presents for a follow up visit. The patient's last visit was on 03/31/2023. In the interim since the last visit he has had no major changes in his health.  On exam today [name] reports he has been out of work for 7 weeks to recover from his hernia surgeries.  He reports he is very eager to go back to work for [employer] and on next week.  He reports he had 4 hernias repaired in 3.5 hours by Dr. Michaell Cowing.  He reports that he is now in 0 out of 10 pain and he has been doing a lot of walking.  He reports he was on oxycodone for 2 weeks but is now completely off of it.  He reports that he has not had any issues with recent infection such as runny nose, sore throat, or cough.  He reports he has been having some occasional issues with allergies with the ragweed.  He denies any bumps or lumps concerning for recurrent lymphoma.  He is not having any fevers, chills, sweats, nausea, vomiting, or diarrhea..  Overall he is at his baseline level of health.  He has no other complaints. A full 10 point ROS is listed below.  MEDICAL HISTORY:  Past Medical History:  Diagnosis Date   Cancer (HCC) 09/2014   recent dx prostate ca-no tx yet   Diabetes mellitus without complication (HCC)    GERD (gastroesophageal reflux disease)    Hodgkin's lymphoma (HCC) 11/2020   Hypertension    Pneumonia    history   Sleep apnea    wears CPAP   Teeth decayed     SURGICAL HISTORY:  Past Surgical History:  Procedure Laterality Date   IR IMAGING GUIDED PORT INSERTION  12/11/2020   IR REMOVAL TUN ACCESS W/ PORT W/O FL MOD SED  07/22/2021   LYMPHADENECTOMY Bilateral 07/07/2022   Procedure: LYMPHADENECTOMY, PELVIC;  Surgeon: Heloise Purpura, MD;  Location: [facility];  Service: Urology;  Laterality: Bilateral;   MASS BIOPSY Left 09/29/2014   Procedure: LEFT LOWER NECK MASS EXCISION;  Surgeon: Serena Colonel, MD;  Location:  [facility];  Service: ENT;  Laterality: Left;   PAROTIDECTOMY Left 09/29/2014   Procedure: LEFT PAROTIDECTOMY;  Surgeon: Serena Colonel, MD;  Location: [facility];  Service: ENT;  Laterality: Left;   ROBOT ASSISTED LAPAROSCOPIC RADICAL PROSTATECTOMY N/A 07/07/2022   Procedure: XI ROBOTIC ASSISTED LAPAROSCOPIC RADICAL PROSTATECTOMY LEVEL 2;  Surgeon: Heloise Purpura, MD;  Location: [facility];  Service: Urology;  Laterality: N/A;   SEPTOPLASTY  12/05/1974   UMBILICAL HERNIA REPAIR      SOCIAL HISTORY: Social History   Socioeconomic History   Marital status: Widowed    Spouse name: Not on file   Number of children: Not on file   Years of education: Not on file   Highest education level: Not on file  Occupational History   Not on file  Tobacco Use   Smoking status: Former    Current packs/day: 0.00    Average packs/day: 3.0 packs/day for 32.0 years (96.0 ttl pk-yrs)    Types: Cigarettes    Start date: 11/26/1985    Quit date: 11/26/2017    Years since quitting: 5.8   Smokeless tobacco: Never  Vaping Use   Vaping status: Never Used  Substance and Sexual Activity   Alcohol use: Yes    Comment: twice a year   Drug use: No   Sexual activity: Yes  Other Topics Concern   Not on file  Social History Narrative   Not on file   Social Determinants of Health   Financial Resource Strain: Not on file  Food Insecurity: No Food Insecurity (08/13/2023)   Hunger Vital Sign    Worried About Running Out of Food in the Last Year: Never true    Ran Out of Food in the Last Year: Never true  Transportation Needs: No Transportation Needs (08/13/2023)   PRAPARE - Administrator, Civil Service (Medical): No    Lack of Transportation (Non-Medical): No  Physical Activity: Not on file  Stress: Not on file  Social Connections: Not on file  Intimate Partner Violence: Not At Risk (08/13/2023)   Humiliation, Afraid, Rape, and Kick questionnaire    Fear of Current or  Ex-Partner: No    Emotionally Abused: No    Physically Abused: No    Sexually Abused: No    FAMILY HISTORY: Family History  Problem Relation Age of Onset   Stroke Mother    Heart disease Father     ALLERGIES:  has No Known Allergies.  MEDICATIONS:  Current Outpatient Medications  Medication Sig Dispense Refill   amLODipine (NORVASC) 5 MG tablet Take 5 mg by mouth every evening.     folic acid (FOLVITE) 1 MG tablet Take 1 mg by mouth daily.     losartan (COZAAR) 100 MG tablet Take 100 mg by mouth every evening.     metFORMIN (GLUCOPHAGE) 500 MG tablet Take 500 mg by mouth 2 (two) times daily with a meal.     methotrexate (RHEUMATREX) 2.5 MG tablet Take 20 mg by mouth once a week.  pantoprazole (PROTONIX) 40 MG tablet Take 40 mg by mouth every evening.     predniSONE (DELTASONE) 5 MG tablet Take 5 mg by mouth daily.     rosuvastatin (CRESTOR) 5 MG tablet Take 5 mg by mouth every evening.     No current facility-administered medications for this visit.   Facility-Administered Medications Ordered in Other Visits  Medication Dose Route Frequency Provider Last Rate Last Admin   0.9 %  sodium chloride infusion   Intravenous Continuous Allred, Darrell K, PA-C        REVIEW OF SYSTEMS:   Constitutional: ( - ) fevers, ( - )  chills , ( - ) night sweats Eyes: ( - ) blurriness of vision, ( - ) double vision, ( - ) watery eyes Ears, nose, mouth, throat, and face: ( - ) mucositis, ( - ) sore throat Respiratory: ( - ) cough, ( - ) dyspnea, ( - ) wheezes Cardiovascular: ( - ) palpitation, ( - ) chest discomfort, ( - ) lower extremity swelling Gastrointestinal:  ( - ) nausea, ( - ) heartburn, ( - ) change in bowel habits Skin: ( - ) abnormal skin rashes Lymphatics: ( - ) new lymphadenopathy, ( - ) easy bruising Neurological: ( - ) numbness, ( - ) tingling, ( - ) new weaknesses Behavioral/Psych: ( - ) mood change, ( - ) new changes  All other systems were reviewed with the patient and  are negative.  PHYSICAL EXAMINATION: ECOG PERFORMANCE STATUS: 1 - Symptomatic but completely ambulatory  Vitals:   09/29/23 1425  BP: (!) 150/76  Pulse: 81  Resp: 16  Temp: 98.5 F (36.9 C)  SpO2: 96%       Filed Weights   09/29/23 1425  Weight: 255 lb 8 oz (115.9 kg)        GENERAL: well appearing middle aged Caucasian male in NAD SKIN: skin color, texture, turgor are normal, no rashes or significant lesions EYES: conjunctiva are pink and non-injected, sclera clear LUNGS: clear to auscultation and percussion with normal breathing effort HEART: regular rate & rhythm and no murmurs and no lower extremity edema Musculoskeletal: no cyanosis of digits and no clubbing  PSYCH: alert & oriented x 3, fluent speech NEURO: no focal motor/sensory deficits  LABORATORY DATA:  I have reviewed the data as listed    Latest Ref Rng & Units 09/29/2023    2:03 PM 08/13/2023    4:15 AM 08/12/2023   10:54 AM  CBC  WBC 4.0 - 10.5 K/uL 6.5  8.8  13.2   Hemoglobin 13.0 - 17.0 g/dL 69.6  29.5  28.4   Hematocrit 39.0 - 52.0 % 37.4  40.9  41.8   Platelets 150 - 400 K/uL 228  147  192        Latest Ref Rng & Units 09/29/2023    2:03 PM 08/13/2023    4:15 AM 08/12/2023   10:54 AM  CMP  Glucose 70 - 99 mg/dL 132  440  102   BUN 8 - 23 mg/dL 10  25  18    Creatinine 0.61 - 1.24 mg/dL 7.25  3.66  4.40   Sodium 135 - 145 mmol/L 139  133  134   Potassium 3.5 - 5.1 mmol/L 3.6  3.3  3.7   Chloride 98 - 111 mmol/L 103  97  98   CO2 22 - 32 mmol/L 28  26  26    Calcium 8.9 - 10.3 mg/dL 9.2  8.7  8.6  Total Protein 6.5 - 8.1 g/dL 7.0     Total Bilirubin 0.3 - 1.2 mg/dL 0.4     Alkaline Phos 38 - 126 U/L 58     AST 15 - 41 U/L 12     ALT 0 - 44 U/L 12       No results found for: "MPROTEIN" No results found for: "KPAFRELGTCHN", "LAMBDASER", "KAPLAMBRATIO"   RADIOGRAPHIC STUDIES:  No results found.  ASSESSMENT & PLAN [name] 62 y.o. male with medical history significant for  Classical Hodgkin's lymphoma Early Stage/Unfavorable risk who presents for a follow up visit.  After review the labs, the records, review of the PFTs, and review the echocardiogram the findings are most consistent with an early stage/unfavorable risk classical Hodgkin's lymphoma.  Given that he meets unfavorable criteria via multiple guidelines I recommended that we proceed with treatment as recommended in the NCCN guidelines for patients with early stage unfavorable disease (which is the exact same treatment recommended for stage III-IV disease).  Previously we discussed the AVD chemotherapy regimen.  We discussed the expected side effects including possible hair loss, nausea, vomiting, diarrhea, constipation, cytopenias, neutropenia, cardiac dysfunction, and neurological damage.  The patient will be treated with every 2 week rounds of chemotherapy using this regimen. AVD chemotherapy consists of doxorubicin 25 mg/m on days 1 and 15, vinblastine 6 mg per metered squared IV on days 1 and 15, and dacarbazine 375 mg/m IV on days 1 and 15.  After 2 cycles of AVD chemotherapy, PET/CT scan from 02/12/2021 revealed complete response to therapy with no evidence of lymphoma. The recommendation is to proceed with a further 4 cycles of AVD therapy.   # Classical Hodgkin's Lymphoma, Mixed Cellularity. 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  --findings were consistent with an Early stage unfavorable risk disease (due to age, ESR elevation, and mixed cellularity based on EORTC, GHSG, and ECGO criteria) --his disease appears early stage with one clear site of involvement in the neck. There are some smaller lymph nodes in the neck and one near the hepatoduodenal ligament, though would favor diagnosis of early stage --based on these criteria the recommendation would be for AVD x 2 cycles with interval PET CT scan followed by AVD x 4 cycles.  --interval PET on 06/21/2021 showed complete response to therapy --we  avoided bleomycin in this patient due to smoking history, mild abnormalities on PFTs and age Plan: -- Labs (CBC, CMP, LDH) from today were reviewed without any intervention needed.  -- Labs show white blood cell count 6.5, hemoglobin 12.7, MCV 88, and platelets of 228 --CT scan q 12 months. Next scan due April 2025.  Last scan on 03/21/2023 showed no evidence of residual or recurrent disease. --RTC in 6 months   #Supportive Care --chemotherapy education complete --port removed on 07/22/2021.   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 No biopsy recommended.  --continue to monitor on future scans.  #Prostate Cancer, Gleason 3+4=7. S/p resection --s/p robotic assisted laparoscopic radical prostatectomy on 07/07/2022.  -- Noted to have benign lymph nodes and Prostatic adenocarcinoma, Gleason score 3+4=7  -- Currently being managed by urology service.  No orders of the defined types were placed in this encounter.  All questions were answered. The patient knows to call the clinic with any problems, questions or concerns.  I have spent a total of  25 minutes minutes of face-to-face and non-face-to-face time, preparing to see the patient, obtaining and/or reviewing separately obtained history, performing a medically appropriate examination, counseling and educating the patient, ordering tests,documenting clinical information in the electronic health record and care coordination.   Dean Barns, MD Department of Hematology/Oncology [facility] at [facility] Phone: [phone] Pager: [phone] Email: [email or web address]  10/01/2023 6:02 PM

## 2023-10-01 ENCOUNTER — Encounter: Payer: Self-pay | Admitting: Hematology and Oncology

## 2023-10-04 DIAGNOSIS — H40013 Open angle with borderline findings, low risk, bilateral: Secondary | ICD-10-CM | POA: Diagnosis not present

## 2023-10-04 DIAGNOSIS — H25811 Combined forms of age-related cataract, right eye: Secondary | ICD-10-CM | POA: Diagnosis not present

## 2023-10-04 DIAGNOSIS — H25813 Combined forms of age-related cataract, bilateral: Secondary | ICD-10-CM | POA: Diagnosis not present

## 2023-10-04 DIAGNOSIS — H52213 Irregular astigmatism, bilateral: Secondary | ICD-10-CM | POA: Diagnosis not present

## 2023-10-17 DIAGNOSIS — H25813 Combined forms of age-related cataract, bilateral: Secondary | ICD-10-CM | POA: Diagnosis not present

## 2023-10-17 DIAGNOSIS — H268 Other specified cataract: Secondary | ICD-10-CM | POA: Diagnosis not present

## 2023-10-17 DIAGNOSIS — H2511 Age-related nuclear cataract, right eye: Secondary | ICD-10-CM | POA: Diagnosis not present

## 2023-10-27 DIAGNOSIS — G4733 Obstructive sleep apnea (adult) (pediatric): Secondary | ICD-10-CM | POA: Diagnosis not present

## 2023-11-07 DIAGNOSIS — H25812 Combined forms of age-related cataract, left eye: Secondary | ICD-10-CM | POA: Diagnosis not present

## 2023-11-07 DIAGNOSIS — H52213 Irregular astigmatism, bilateral: Secondary | ICD-10-CM | POA: Diagnosis not present

## 2023-11-07 DIAGNOSIS — Z961 Presence of intraocular lens: Secondary | ICD-10-CM | POA: Diagnosis not present

## 2023-11-10 DIAGNOSIS — E1169 Type 2 diabetes mellitus with other specified complication: Secondary | ICD-10-CM | POA: Diagnosis not present

## 2023-11-10 DIAGNOSIS — M069 Rheumatoid arthritis, unspecified: Secondary | ICD-10-CM | POA: Diagnosis not present

## 2023-11-10 DIAGNOSIS — I10 Essential (primary) hypertension: Secondary | ICD-10-CM | POA: Diagnosis not present

## 2023-11-14 DIAGNOSIS — H25812 Combined forms of age-related cataract, left eye: Secondary | ICD-10-CM | POA: Diagnosis not present

## 2023-11-14 DIAGNOSIS — H2512 Age-related nuclear cataract, left eye: Secondary | ICD-10-CM | POA: Diagnosis not present

## 2023-11-14 DIAGNOSIS — H268 Other specified cataract: Secondary | ICD-10-CM | POA: Diagnosis not present

## 2023-11-26 DIAGNOSIS — G4733 Obstructive sleep apnea (adult) (pediatric): Secondary | ICD-10-CM | POA: Diagnosis not present

## 2024-01-02 DIAGNOSIS — M0609 Rheumatoid arthritis without rheumatoid factor, multiple sites: Secondary | ICD-10-CM | POA: Diagnosis not present

## 2024-01-02 DIAGNOSIS — Z79899 Other long term (current) drug therapy: Secondary | ICD-10-CM | POA: Diagnosis not present

## 2024-01-02 DIAGNOSIS — M2550 Pain in unspecified joint: Secondary | ICD-10-CM | POA: Diagnosis not present

## 2024-01-02 DIAGNOSIS — M1990 Unspecified osteoarthritis, unspecified site: Secondary | ICD-10-CM | POA: Diagnosis not present

## 2024-01-17 DIAGNOSIS — C61 Malignant neoplasm of prostate: Secondary | ICD-10-CM | POA: Diagnosis not present

## 2024-01-26 DIAGNOSIS — C61 Malignant neoplasm of prostate: Secondary | ICD-10-CM | POA: Diagnosis not present

## 2024-01-26 DIAGNOSIS — N3941 Urge incontinence: Secondary | ICD-10-CM | POA: Diagnosis not present

## 2024-03-04 DIAGNOSIS — Z79899 Other long term (current) drug therapy: Secondary | ICD-10-CM | POA: Diagnosis not present

## 2024-03-04 DIAGNOSIS — M2550 Pain in unspecified joint: Secondary | ICD-10-CM | POA: Diagnosis not present

## 2024-03-04 DIAGNOSIS — M0609 Rheumatoid arthritis without rheumatoid factor, multiple sites: Secondary | ICD-10-CM | POA: Diagnosis not present

## 2024-03-04 DIAGNOSIS — M1990 Unspecified osteoarthritis, unspecified site: Secondary | ICD-10-CM | POA: Diagnosis not present

## 2024-03-08 ENCOUNTER — Telehealth: Payer: Self-pay | Admitting: Hematology and Oncology

## 2024-03-15 ENCOUNTER — Other Ambulatory Visit: Payer: BC Managed Care – PPO

## 2024-03-15 ENCOUNTER — Ambulatory Visit: Payer: BC Managed Care – PPO | Admitting: Hematology and Oncology

## 2024-03-19 ENCOUNTER — Inpatient Hospital Stay: Admitting: Hematology and Oncology

## 2024-03-19 ENCOUNTER — Other Ambulatory Visit: Payer: Self-pay | Admitting: Hematology and Oncology

## 2024-03-19 ENCOUNTER — Inpatient Hospital Stay: Attending: Hematology and Oncology

## 2024-03-19 DIAGNOSIS — Z79899 Other long term (current) drug therapy: Secondary | ICD-10-CM | POA: Insufficient documentation

## 2024-03-19 DIAGNOSIS — Z87891 Personal history of nicotine dependence: Secondary | ICD-10-CM | POA: Insufficient documentation

## 2024-03-19 DIAGNOSIS — Z8572 Personal history of non-Hodgkin lymphomas: Secondary | ICD-10-CM | POA: Insufficient documentation

## 2024-03-19 DIAGNOSIS — Z79631 Long term (current) use of antimetabolite agent: Secondary | ICD-10-CM | POA: Insufficient documentation

## 2024-03-19 DIAGNOSIS — C8121 Mixed cellularity classical Hodgkin lymphoma, lymph nodes of head, face, and neck: Secondary | ICD-10-CM

## 2024-03-19 DIAGNOSIS — Z8546 Personal history of malignant neoplasm of prostate: Secondary | ICD-10-CM | POA: Insufficient documentation

## 2024-03-19 NOTE — Progress Notes (Deleted)
 [facility] [phone]   [phone]  PROGRESS NOTE  Patient Care Team: Farris Has, MD as PCP - General (Family Medicine) Karie Soda, MD as Consulting Physician (General Surgery) Jaci Standard, MD as Consulting Physician (Hematology and Oncology)  Hematological/Oncological History # Classical Hodgkin's Lymphoma, Mixed Cellularity. 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 2) 10/27/2020: resection of neck mass. Pathology showed classical Hodgkin's lymphoma, mixed cellularity subtype 3) 11/13/2020: establish care with Dr. Leonides Schanz  4) 11/26/2020: PET CT scan shows single hypermetabolic unenlarged right level II cervical lymph node identified on today's study. FDG uptake is compatible with Deauville 4. Other small bilateral cervical and upper normal to borderline hepatoduodenal ligament lymph nodes show Deauville 2 uptake levels 5) 12/18/2020: Cycle 1 Day 1 of AVD chemotherapy  6) 01/15/2021: Cycle 2 Day 1 of AVD chemotherapy  7) 02/12/2021: PET CT scan shows no evidence of residual disease 8) 02/15/2021: Cycle 3 Day 1 of AVD chemotherapy  9) 03/01/2021: Cycle 3 Day 15 of AVD chemotherapy  10) 03/15/2021: Cycle 4 Day 1 of AVD chemotherapy  11) 03/29/2021: Cycle 4 Day 15 of AVD chemotherapy 12) 04/12/2021: Cycle 5 Day 1 of AVD chemotherapy 13) 04/26/2021: Cycle 5 Day 15 of AVD chemotherapy 14) 05/10/2021: Cycle 6 Day 1 of AVD chemotherapy 15) 05/24/2021: Cycle 6 Day 15 of AVD chemotherapy 16) 06/21/2021: PET CT scan shows no evidence of residual/recurrent lymphoma. Patient in complete remission, enter surviellance.  17) 10/04/2022: CT chest abdomen pelvis performed, it showed no evidence of residual or recurrent disease. 18) 03/21/2023: CT chest abdomen pelvis performed, it showed no evidence of residual or recurrent disease.  Interval History:  [name] 63 y.o. male with medical history significant for Classical Hodgkin's lymphoma Early Stage/Unfavorable risk who presents for a follow up visit. The patient's last visit was on 03/31/2023. In the interim since the last visit he has had no major changes in his health.  On exam today [name] reports he has been out of work for 7 weeks to recover from his hernia surgeries.  He reports he is very eager to go back to work for [employer] and on next week.  He reports he had 4 hernias repaired in 3.5 hours by Dr. Michaell Cowing.  He reports that he is now in 0 out of 10 pain and he has been doing a lot of walking.  He reports he was on oxycodone for 2 weeks but is now completely off of it.  He reports that he has not had any issues with recent infection such as runny nose, sore throat, or cough.  He reports he has been having some occasional issues with allergies with the ragweed.  He denies any bumps or lumps concerning for recurrent lymphoma.  He is not having any fevers, chills, sweats, nausea, vomiting, or diarrhea..  Overall he is at his baseline level of health.  He has no other complaints. A full 10 point ROS is listed below.  MEDICAL HISTORY:  Past Medical History:  Diagnosis Date   Cancer (HCC) 09/2014   recent dx prostate ca-no tx yet   Diabetes mellitus without complication (HCC)    GERD (gastroesophageal reflux disease)    Hodgkin's lymphoma (HCC) 11/2020   Hypertension    Pneumonia    history   Sleep apnea    wears CPAP   Teeth decayed     SURGICAL HISTORY:  Past Surgical History:  Procedure Laterality Date   IR IMAGING GUIDED PORT INSERTION  12/11/2020   IR REMOVAL TUN ACCESS W/ PORT W/O FL MOD SED  07/22/2021   LYMPHADENECTOMY Bilateral 07/07/2022   Procedure: LYMPHADENECTOMY, PELVIC;  Surgeon: Heloise Purpura, MD;  Location: [facility];  Service: Urology;  Laterality: Bilateral;   MASS BIOPSY Left 09/29/2014   Procedure: LEFT LOWER NECK MASS EXCISION;  Surgeon: Serena Colonel, MD;  Location:  [facility];  Service: ENT;  Laterality: Left;   PAROTIDECTOMY Left 09/29/2014   Procedure: LEFT PAROTIDECTOMY;  Surgeon: Serena Colonel, MD;  Location: [facility];  Service: ENT;  Laterality: Left;   ROBOT ASSISTED LAPAROSCOPIC RADICAL PROSTATECTOMY N/A 07/07/2022   Procedure: XI ROBOTIC ASSISTED LAPAROSCOPIC RADICAL PROSTATECTOMY LEVEL 2;  Surgeon: Heloise Purpura, MD;  Location: [facility];  Service: Urology;  Laterality: N/A;   SEPTOPLASTY  12/05/1974   UMBILICAL HERNIA REPAIR      SOCIAL HISTORY: Social History   Socioeconomic History   Marital status: Widowed    Spouse name: Not on file   Number of children: Not on file   Years of education: Not on file   Highest education level: Not on file  Occupational History   Not on file  Tobacco Use   Smoking status: Former    Current packs/day: 0.00    Average packs/day: 3.0 packs/day for 32.0 years (96.0 ttl pk-yrs)    Types: Cigarettes    Start date: 11/26/1985    Quit date: 11/26/2017    Years since quitting: 6.3   Smokeless tobacco: Never  Vaping Use   Vaping status: Never Used  Substance and Sexual Activity   Alcohol use: Yes    Comment: twice a year   Drug use: No   Sexual activity: Yes  Other Topics Concern   Not on file  Social History Narrative   Not on file   Social Drivers of Health   Financial Resource Strain: Not on file  Food Insecurity: No Food Insecurity (08/13/2023)   Hunger Vital Sign    Worried About Running Out of Food in the Last Year: Never true    Ran Out of Food in the Last Year: Never true  Transportation Needs: No Transportation Needs (08/13/2023)   PRAPARE - Administrator, Civil Service (Medical): No    Lack of Transportation (Non-Medical): No  Physical Activity: Not on file  Stress: Not on file  Social Connections: Not on file  Intimate Partner Violence: Not At Risk (08/13/2023)   Humiliation, Afraid, Rape, and Kick questionnaire    Fear of Current or  Ex-Partner: No    Emotionally Abused: No    Physically Abused: No    Sexually Abused: No    FAMILY HISTORY: Family History  Problem Relation Age of Onset   Stroke Mother    Heart disease Father     ALLERGIES:  has no known allergies.  MEDICATIONS:  Current Outpatient Medications  Medication Sig Dispense Refill   amLODipine (NORVASC) 5 MG tablet Take 5 mg by mouth every evening.     folic acid (FOLVITE) 1 MG tablet Take 1 mg by mouth daily.     losartan (COZAAR) 100 MG tablet Take 100 mg by mouth every evening.     metFORMIN (GLUCOPHAGE) 500 MG tablet Take 500 mg by mouth 2 (two) times daily with a meal.     methotrexate (RHEUMATREX) 2.5 MG tablet Take 20 mg by mouth once a week.  pantoprazole (PROTONIX) 40 MG tablet Take 40 mg by mouth every evening.     predniSONE (DELTASONE) 5 MG tablet Take 5 mg by mouth daily.     rosuvastatin (CRESTOR) 5 MG tablet Take 5 mg by mouth every evening.     No current facility-administered medications for this visit.   Facility-Administered Medications Ordered in Other Visits  Medication Dose Route Frequency Provider Last Rate Last Admin   0.9 %  sodium chloride infusion   Intravenous Continuous Allred, Darrell K, PA-C        REVIEW OF SYSTEMS:   Constitutional: ( - ) fevers, ( - )  chills , ( - ) night sweats Eyes: ( - ) blurriness of vision, ( - ) double vision, ( - ) watery eyes Ears, nose, mouth, throat, and face: ( - ) mucositis, ( - ) sore throat Respiratory: ( - ) cough, ( - ) dyspnea, ( - ) wheezes Cardiovascular: ( - ) palpitation, ( - ) chest discomfort, ( - ) lower extremity swelling Gastrointestinal:  ( - ) nausea, ( - ) heartburn, ( - ) change in bowel habits Skin: ( - ) abnormal skin rashes Lymphatics: ( - ) new lymphadenopathy, ( - ) easy bruising Neurological: ( - ) numbness, ( - ) tingling, ( - ) new weaknesses Behavioral/Psych: ( - ) mood change, ( - ) new changes  All other systems were reviewed with the patient and  are negative.  PHYSICAL EXAMINATION: ECOG PERFORMANCE STATUS: 1 - Symptomatic but completely ambulatory  There were no vitals filed for this visit.      There were no vitals filed for this visit.       GENERAL: well appearing middle aged Caucasian male in NAD SKIN: skin color, texture, turgor are normal, no rashes or significant lesions EYES: conjunctiva are pink and non-injected, sclera clear LUNGS: clear to auscultation and percussion with normal breathing effort HEART: regular rate & rhythm and no murmurs and no lower extremity edema Musculoskeletal: no cyanosis of digits and no clubbing  PSYCH: alert & oriented x 3, fluent speech NEURO: no focal motor/sensory deficits  LABORATORY DATA:  I have reviewed the data as listed    Latest Ref Rng & Units 09/29/2023    2:03 PM 08/13/2023    4:15 AM 08/12/2023   10:54 AM  CBC  WBC 4.0 - 10.5 K/uL 6.5  8.8  13.2   Hemoglobin 13.0 - 17.0 g/dL 13.0  86.5  78.4   Hematocrit 39.0 - 52.0 % 37.4  40.9  41.8   Platelets 150 - 400 K/uL 228  147  192        Latest Ref Rng & Units 09/29/2023    2:03 PM 08/13/2023    4:15 AM 08/12/2023   10:54 AM  CMP  Glucose 70 - 99 mg/dL 696  295  284   BUN 8 - 23 mg/dL 10  25  18    Creatinine 0.61 - 1.24 mg/dL 1.32  4.40  1.02   Sodium 135 - 145 mmol/L 139  133  134   Potassium 3.5 - 5.1 mmol/L 3.6  3.3  3.7   Chloride 98 - 111 mmol/L 103  97  98   CO2 22 - 32 mmol/L 28  26  26    Calcium 8.9 - 10.3 mg/dL 9.2  8.7  8.6   Total Protein 6.5 - 8.1 g/dL 7.0     Total Bilirubin 0.3 - 1.2 mg/dL 0.4     Alkaline  Phos 38 - 126 U/L 58     AST 15 - 41 U/L 12     ALT 0 - 44 U/L 12       No results found for: "MPROTEIN" No results found for: "KPAFRELGTCHN", "LAMBDASER", "KAPLAMBRATIO"   RADIOGRAPHIC STUDIES:  No results found.  ASSESSMENT & PLAN [name] 63 y.o. male with medical history significant for Classical Hodgkin's lymphoma Early Stage/Unfavorable risk who presents for a follow up  visit.  After review the labs, the records, review of the PFTs, and review the echocardiogram the findings are most consistent with an early stage/unfavorable risk classical Hodgkin's lymphoma.  Given that he meets unfavorable criteria via multiple guidelines I recommended that we proceed with treatment as recommended in the NCCN guidelines for patients with early stage unfavorable disease (which is the exact same treatment recommended for stage III-IV disease).  Previously we discussed the AVD chemotherapy regimen.  We discussed the expected side effects including possible hair loss, nausea, vomiting, diarrhea, constipation, cytopenias, neutropenia, cardiac dysfunction, and neurological damage.  The patient will be treated with every 2 week rounds of chemotherapy using this regimen. AVD chemotherapy consists of doxorubicin 25 mg/m on days 1 and 15, vinblastine 6 mg per metered squared IV on days 1 and 15, and dacarbazine 375 mg/m IV on days 1 and 15.  After 2 cycles of AVD chemotherapy, PET/CT scan from 02/12/2021 revealed complete response to therapy with no evidence of lymphoma. The recommendation is to proceed with a further 4 cycles of AVD therapy.   # Classical Hodgkin's Lymphoma, Mixed Cellularity. 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  --findings were consistent with an Early stage unfavorable risk disease (due to age, ESR elevation, and mixed cellularity based on EORTC, GHSG, and ECGO criteria) --his disease appears early stage with one clear site of involvement in the neck. There are some smaller lymph nodes in the neck and one near the hepatoduodenal ligament, though would favor diagnosis of early stage --based on these criteria the recommendation would be for AVD x 2 cycles with interval PET CT scan followed by AVD x 4 cycles.  --interval PET on 06/21/2021 showed complete response to therapy --we avoided bleomycin in this patient due to smoking history, mild abnormalities on PFTs and  age Plan: -- Labs (CBC, CMP, LDH) from today were reviewed without any intervention needed.  -- Labs show white blood cell count 6.5, hemoglobin 12.7, MCV 88, and platelets of 228 --CT scan q 12 months. Next scan due April 2025.  Last scan on 03/21/2023 showed no evidence of residual or recurrent disease. --RTC in 6 months   #Supportive Care --chemotherapy education complete --port removed on 07/22/2021.   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 No biopsy recommended.  --continue to monitor on future scans.  #Prostate Cancer, Gleason 3+4=7. S/p resection --s/p robotic assisted laparoscopic radical prostatectomy on 07/07/2022.  -- Noted to have benign lymph nodes and Prostatic adenocarcinoma, Gleason score 3+4=7  -- Currently being managed by urology service.  No orders of the defined types were placed in this encounter.  All questions were answered. The patient knows to call the clinic with any problems, questions or concerns.  I have spent a total of 25 minutes minutes of face-to-face and non-face-to-face time, preparing to see the patient, obtaining and/or reviewing separately obtained history, performing a medically appropriate  examination, counseling and educating the patient, ordering tests,documenting clinical information in the electronic health record and care coordination.   Dean Clay, MD Department of Hematology/Oncology [facility] Phone: [phone] Pager: [phone] Email: [name]  03/19/2024 7:27 AM

## 2024-03-22 ENCOUNTER — Telehealth: Payer: Self-pay | Admitting: Hematology and Oncology

## 2024-03-25 DIAGNOSIS — G4733 Obstructive sleep apnea (adult) (pediatric): Secondary | ICD-10-CM | POA: Diagnosis not present

## 2024-03-27 ENCOUNTER — Inpatient Hospital Stay

## 2024-03-27 ENCOUNTER — Inpatient Hospital Stay (HOSPITAL_BASED_OUTPATIENT_CLINIC_OR_DEPARTMENT_OTHER): Admitting: Hematology and Oncology

## 2024-03-27 VITALS — BP 135/81 | HR 84 | Temp 99.2°F | Resp 14 | Wt 271.2 lb

## 2024-03-27 DIAGNOSIS — C8121 Mixed cellularity classical Hodgkin lymphoma, lymph nodes of head, face, and neck: Secondary | ICD-10-CM

## 2024-03-27 DIAGNOSIS — Z8572 Personal history of non-Hodgkin lymphomas: Secondary | ICD-10-CM | POA: Diagnosis not present

## 2024-03-27 DIAGNOSIS — Z8546 Personal history of malignant neoplasm of prostate: Secondary | ICD-10-CM | POA: Diagnosis not present

## 2024-03-27 DIAGNOSIS — Z87891 Personal history of nicotine dependence: Secondary | ICD-10-CM | POA: Diagnosis not present

## 2024-03-27 DIAGNOSIS — Z79899 Other long term (current) drug therapy: Secondary | ICD-10-CM | POA: Diagnosis not present

## 2024-03-27 DIAGNOSIS — Z79631 Long term (current) use of antimetabolite agent: Secondary | ICD-10-CM | POA: Diagnosis not present

## 2024-03-27 LAB — CMP (CANCER CENTER ONLY)
ALT: 24 U/L (ref 0–44)
AST: 19 U/L (ref 15–41)
Albumin: 4.5 g/dL (ref 3.5–5.0)
Alkaline Phosphatase: 59 U/L (ref 38–126)
Anion gap: 7 (ref 5–15)
BUN: 11 mg/dL (ref 8–23)
CO2: 28 mmol/L (ref 22–32)
Calcium: 9.1 mg/dL (ref 8.9–10.3)
Chloride: 104 mmol/L (ref 98–111)
Creatinine: 0.88 mg/dL (ref 0.61–1.24)
GFR, Estimated: >60 mL/min (ref >60)
Glucose, Bld: 117 mg/dL — ABNORMAL HIGH (ref 70–99)
Potassium: 4 mmol/L (ref 3.5–5.1)
Sodium: 139 mmol/L (ref 135–145)
Total Bilirubin: 0.5 mg/dL (ref 0.0–1.2)
Total Protein: 7.2 g/dL (ref 6.5–8.1)

## 2024-03-27 LAB — LACTATE DEHYDROGENASE: LDH: 156 U/L (ref 98–192)

## 2024-03-27 LAB — CBC WITH DIFFERENTIAL (CANCER CENTER ONLY)
Abs Immature Granulocytes: 0.02 10*3/uL (ref 0.00–0.07)
Basophils Absolute: 0.1 10*3/uL (ref 0.0–0.1)
Basophils Relative: 1 %
Eosinophils Absolute: 0.2 10*3/uL (ref 0.0–0.5)
Eosinophils Relative: 4 %
HCT: 42.2 % (ref 39.0–52.0)
Hemoglobin: 14.2 g/dL (ref 13.0–17.0)
Immature Granulocytes: 0 %
Lymphocytes Relative: 19 %
Lymphs Abs: 1.1 10*3/uL (ref 0.7–4.0)
MCH: 30 pg (ref 26.0–34.0)
MCHC: 33.6 g/dL (ref 30.0–36.0)
MCV: 89 fL (ref 80.0–100.0)
Monocytes Absolute: 0.5 10*3/uL (ref 0.1–1.0)
Monocytes Relative: 8 %
Neutro Abs: 3.9 10*3/uL (ref 1.7–7.7)
Neutrophils Relative %: 68 %
Platelet Count: 206 10*3/uL (ref 150–400)
RBC: 4.74 MIL/uL (ref 4.22–5.81)
RDW: 13.4 % (ref 11.5–15.5)
WBC Count: 5.8 10*3/uL (ref 4.0–10.5)
nRBC: 0 % (ref 0.0–0.2)

## 2024-03-27 NOTE — Progress Notes (Signed)
 [facility] [phone]   [phone]  PROGRESS NOTE  Patient Care Team: Dean Coho, MD as PCP - General (Family Medicine) Dean Champagne, MD as Consulting Physician (General Surgery) Dean Bame, MD as Consulting Physician (Hematology and Oncology)  Hematological/Oncological History # Classical Hodgkin's Lymphoma, Mixed Cellularity. 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 2) 10/27/2020: resection of neck mass. Pathology showed classical Hodgkin's lymphoma, mixed cellularity subtype 3) 11/13/2020: establish care with Dr. Rosaline Bell  4) 11/26/2020: PET CT scan shows single hypermetabolic unenlarged right level II cervical lymph node identified on today's study. FDG uptake is compatible with Deauville 4. Other small bilateral cervical and upper normal to borderline hepatoduodenal ligament lymph nodes show Deauville 2 uptake levels 5) 12/18/2020: Cycle 1 Day 1 of AVD chemotherapy  6) 01/15/2021: Cycle 2 Day 1 of AVD chemotherapy  7) 02/12/2021: PET CT scan shows no evidence of residual disease 8) 02/15/2021: Cycle 3 Day 1 of AVD chemotherapy  9) 03/01/2021: Cycle 3 Day 15 of AVD chemotherapy  10) 03/15/2021: Cycle 4 Day 1 of AVD chemotherapy  11) 03/29/2021: Cycle 4 Day 15 of AVD chemotherapy 12) 04/12/2021: Cycle 5 Day 1 of AVD chemotherapy 13) 04/26/2021: Cycle 5 Day 15 of AVD chemotherapy 14) 05/10/2021: Cycle 6 Day 1 of AVD chemotherapy 15) 05/24/2021: Cycle 6 Day 15 of AVD chemotherapy 16) 06/21/2021: PET CT scan shows no evidence of residual/recurrent lymphoma. Patient in complete remission, enter surviellance.  17) 10/04/2022: CT chest abdomen pelvis performed, it showed no evidence of residual or recurrent disease. 18) 03/21/2023: CT chest abdomen pelvis performed, it showed no evidence of residual or recurrent disease.  Interval History:  [name] 63 y.o. male with medical history significant for Classical Hodgkin's lymphoma Early Stage/Unfavorable risk who presents for a follow up visit. The patient's last visit was on 09/29/2023. In the interim since the last visit he has had no major changes in his health.  On exam today [name] reports he has been doing well in the interim since her last visit.  He reports the pollen has been bothering him more as he gets older.  He notes that in the interim since her last visit he has been "gaining weight".  He notes that he will not be working for [employer] anymore as he are not using fresh dough and be using frozen dough as of May 21.  He reports that he is "semiretired" he is withdrawn some of his [employer].  He would like to continue working out 2 to 3 days/week.  He has been staying away from the flu and the norovirus.  He denies any bumps or lumps concerning for lymphadenopathy.  His energy levels are stable and his appetite is good.  Overall he feels well and has no questions concerns or complaints today.  Full 10 point ROS is otherwise negative.  MEDICAL HISTORY:  Past Medical History:  Diagnosis Date   Cancer (HCC) 09/2014   recent dx prostate ca-no tx yet   Diabetes mellitus without complication (HCC)    GERD (gastroesophageal reflux disease)    Hodgkin's lymphoma (HCC) 11/2020   Hypertension    Pneumonia    history   Sleep apnea    wears CPAP   Teeth decayed     SURGICAL HISTORY: Past Surgical History:  Procedure Laterality Date   IR IMAGING GUIDED PORT INSERTION  12/11/2020   IR  REMOVAL TUN ACCESS W/ PORT W/O FL MOD SED  07/22/2021   LYMPHADENECTOMY Bilateral 07/07/2022   Procedure: LYMPHADENECTOMY, PELVIC;  Surgeon: Dean Hunting, MD;  Location: [facility];  Service: Urology;  Laterality: Bilateral;   MASS BIOPSY Left 09/29/2014   Procedure: LEFT LOWER NECK MASS EXCISION;  Surgeon: Dean Mellow, MD;  Location: [facility];  Service: ENT;  Laterality: Left;    PAROTIDECTOMY Left 09/29/2014   Procedure: LEFT PAROTIDECTOMY;  Surgeon: Dean Mellow, MD;  Location: [facility];  Service: ENT;  Laterality: Left;   ROBOT ASSISTED LAPAROSCOPIC RADICAL PROSTATECTOMY N/A 07/07/2022   Procedure: XI ROBOTIC ASSISTED LAPAROSCOPIC RADICAL PROSTATECTOMY LEVEL 2;  Surgeon: Dean Hunting, MD;  Location: [facility];  Service: Urology;  Laterality: N/A;   SEPTOPLASTY  12/05/1974   UMBILICAL HERNIA REPAIR      SOCIAL HISTORY: Social History   Socioeconomic History   Marital status: Widowed    Spouse name: Not on file   Number of children: Not on file   Years of education: Not on file   Highest education level: Not on file  Occupational History   Not on file  Tobacco Use   Smoking status: Former    Current packs/day: 0.00    Average packs/day: 3.0 packs/day for 32.0 years (96.0 ttl pk-yrs)    Types: Cigarettes    Start date: 11/26/1985    Quit date: 11/26/2017    Years since quitting: 6.3   Smokeless tobacco: Never  Vaping Use   Vaping status: Never Used  Substance and Sexual Activity   Alcohol use: Yes    Comment: twice a year   Drug use: No   Sexual activity: Yes  Other Topics Concern   Not on file  Social History Narrative   Not on file   Social Drivers of Health   Financial Resource Strain: Not on file  Food Insecurity: No Food Insecurity (08/13/2023)   Hunger Vital Sign    Worried About Running Out of Food in the Last Year: Never true    Ran Out of Food in the Last Year: Never true  Transportation Needs: No Transportation Needs (08/13/2023)   PRAPARE - Administrator, Civil Service (Medical): No    Lack of Transportation (Non-Medical): No  Physical Activity: Not on file  Stress: Not on file  Social Connections: Not on file  Intimate Partner Violence: Not At Risk (08/13/2023)   Humiliation, Afraid, Rape, and Kick questionnaire    Fear of Current or Ex-Partner: No    Emotionally Abused: No    Physically Abused: No     Sexually Abused: No    FAMILY HISTORY: Family History  Problem Relation Age of Onset   Stroke Mother    Heart disease Father     ALLERGIES:  has no known allergies.  MEDICATIONS:  Current Outpatient Medications  Medication Sig Dispense Refill   amLODipine  (NORVASC ) 5 MG tablet Take 5 mg by mouth every evening.     folic acid (FOLVITE) 1 MG tablet Take 1 mg by mouth daily.     losartan  (COZAAR ) 100 MG tablet Take 100 mg by mouth every evening.     metFORMIN (GLUCOPHAGE) 500 MG tablet Take 500 mg by mouth 2 (two) times daily with a meal.     methotrexate (RHEUMATREX) 2.5 MG tablet Take 20 mg by mouth once a week.     pantoprazole  (PROTONIX ) 40 MG tablet Take 40 mg by mouth every evening.     predniSONE (  DELTASONE) 5 MG tablet Take 5 mg by mouth daily.     rosuvastatin  (CRESTOR ) 5 MG tablet Take 5 mg by mouth every evening.     No current facility-administered medications for this visit.   Facility-Administered Medications Ordered in Other Visits  Medication Dose Route Frequency Provider Last Rate Last Admin   0.9 %  sodium chloride  infusion   Intravenous Continuous Allred, Darrell K, PA-C        REVIEW OF SYSTEMS:   Constitutional: ( - ) fevers, ( - )  chills , ( - ) night sweats Eyes: ( - ) blurriness of vision, ( - ) double vision, ( - ) watery eyes Ears, nose, mouth, throat, and face: ( - ) mucositis, ( - ) sore throat Respiratory: ( - ) cough, ( - ) dyspnea, ( - ) wheezes Cardiovascular: ( - ) palpitation, ( - ) chest discomfort, ( - ) lower extremity swelling Gastrointestinal:  ( - ) nausea, ( - ) heartburn, ( - ) change in bowel habits Skin: ( - ) abnormal skin rashes Lymphatics: ( - ) new lymphadenopathy, ( - ) easy bruising Neurological: ( - ) numbness, ( - ) tingling, ( - ) new weaknesses Behavioral/Psych: ( - ) mood change, ( - ) new changes  All other systems were reviewed with the patient and are negative.  PHYSICAL EXAMINATION: ECOG PERFORMANCE STATUS: 1 -  Symptomatic but completely ambulatory  Vitals:   03/27/24 1044  BP: 135/81  Pulse: 84  Resp: 14  Temp: 99.2 F (37.3 C)  SpO2: 95%        Filed Weights   03/27/24 1044  Weight: 271 lb 3.2 oz (123 kg)         GENERAL: well appearing middle aged Caucasian male in NAD SKIN: skin color, texture, turgor are normal, no rashes or significant lesions EYES: conjunctiva are pink and non-injected, sclera clear LUNGS: clear to auscultation and percussion with normal breathing effort HEART: regular rate & rhythm and no murmurs and no lower extremity edema Musculoskeletal: no cyanosis of digits and no clubbing  PSYCH: alert & oriented x 3, fluent speech NEURO: no focal motor/sensory deficits  LABORATORY DATA:  I have reviewed the data as listed    Latest Ref Rng & Units 03/27/2024   10:04 AM 09/29/2023    2:03 PM 08/13/2023    4:15 AM  CBC  WBC 4.0 - 10.5 K/uL 5.8  6.5  8.8   Hemoglobin 13.0 - 17.0 g/dL 91.4  78.2  95.6   Hematocrit 39.0 - 52.0 % 42.2  37.4  40.9   Platelets 150 - 400 K/uL 206  228  147        Latest Ref Rng & Units 03/27/2024   10:04 AM 09/29/2023    2:03 PM 08/13/2023    4:15 AM  CMP  Glucose 70 - 99 mg/dL 213  086  578   BUN 8 - 23 mg/dL 11  10  25    Creatinine 0.61 - 1.24 mg/dL 4.69  6.29  5.28   Sodium 135 - 145 mmol/L 139  139  133   Potassium 3.5 - 5.1 mmol/L 4.0  3.6  3.3   Chloride 98 - 111 mmol/L 104  103  97   CO2 22 - 32 mmol/L 28  28  26    Calcium  8.9 - 10.3 mg/dL 9.1  9.2  8.7   Total Protein 6.5 - 8.1 g/dL 7.2  7.0    Total Bilirubin 0.0 -  1.2 mg/dL 0.5  0.4    Alkaline Phos 38 - 126 U/L 59  58    AST 15 - 41 U/L 19  12    ALT 0 - 44 U/L 24  12      No results found for: "MPROTEIN" No results found for: "KPAFRELGTCHN", "LAMBDASER", "KAPLAMBRATIO"   RADIOGRAPHIC STUDIES:  No results found.  ASSESSMENT & PLAN [name] 63 y.o. male with medical history significant for Classical Hodgkin's lymphoma Early Stage/Unfavorable risk  who presents for a follow up visit.  After review the labs, the records, review of the PFTs, and review the echocardiogram the findings are most consistent with an early stage/unfavorable risk classical Hodgkin's lymphoma.  Given that he meets unfavorable criteria via multiple guidelines I recommended that we proceed with treatment as recommended in the NCCN guidelines for patients with early stage unfavorable disease (which is the exact same treatment recommended for stage III-IV disease).  Previously we discussed the AVD chemotherapy regimen.  We discussed the expected side effects including possible hair loss, nausea, vomiting, diarrhea, constipation, cytopenias, neutropenia, cardiac dysfunction, and neurological damage.  The patient will be treated with every 2 week rounds of chemotherapy using this regimen. AVD chemotherapy consists of doxorubicin  25 mg/m on days 1 and 15, vinblastine  6 mg per metered squared IV on days 1 and 15, and dacarbazine  375 mg/m IV on days 1 and 15.  After 2 cycles of AVD chemotherapy, PET/CT scan from 02/12/2021 revealed complete response to therapy with no evidence of lymphoma. The recommendation is to proceed with a further 4 cycles of AVD therapy.    # Classical Hodgkin's Lymphoma, Mixed Cellularity. 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  --findings were consistent with an Early stage unfavorable risk disease (due to age, ESR elevation, and mixed cellularity based on EORTC, GHSG, and ECGO criteria) --his disease appears early stage with one clear site of involvement in the neck. There are some smaller lymph nodes in the neck and one near the hepatoduodenal ligament, though would favor diagnosis of early stage --based on these criteria the recommendation would be for AVD x 2 cycles with interval PET CT scan followed by AVD x 4 cycles.  --interval PET on 06/21/2021 showed complete response to therapy --we avoided bleomycin in this patient due to smoking history,  mild abnormalities on PFTs and age Plan: -- Labs (CBC, CMP, LDH) from today were reviewed without any intervention needed.  -- Labs show white blood cell count 5.8, hemoglobin 14.2, MCV 89, platelets 206 --CT scan q 12 months. Next scan due April 2025.  Last scan on 03/21/2023 showed no evidence of residual or recurrent disease. --RTC in 6 months    #Supportive Care --chemotherapy education complete --port removed on 07/22/2021.    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 No biopsy recommended.  --continue to monitor on future scans.   #Prostate Cancer, Gleason 3+4=7. S/p resection --s/p robotic assisted laparoscopic radical prostatectomy on 07/07/2022.  -- Noted to have benign lymph nodes and Prostatic adenocarcinoma, Gleason score 3+4=7  -- Currently being managed by urology service.  Orders Placed This Encounter  Procedures   CT CHEST ABDOMEN PELVIS W CONTRAST    Standing Status:   Future    Expected Date:   04/03/2024    Expiration Date:   03/27/2025    If indicated for the  ordered procedure, I authorize the administration of contrast media per Radiology protocol:   Yes    Does the patient have a contrast media/X-ray dye allergy?:   No    Preferred imaging location?:   [facility]    If indicated for the ordered procedure, I authorize the administration of oral contrast media per Radiology protocol:   Yes   All questions were answered. The patient knows to call the clinic with any problems, questions or concerns.  I have spent a total of 30 minutes minutes of face-to-face and non-face-to-face time, preparing to see the patient, obtaining and/or reviewing separately obtained history, performing a medically appropriate examination, counseling and educating the patient, ordering tests,documenting  clinical information in the electronic health record and care coordination.   Rogerio Clay, MD Department of Hematology/Oncology [facility] Phone: [phone] Pager: [phone] Email: [email or web address]  03/27/2024 11:08 AM

## 2024-04-03 ENCOUNTER — Ambulatory Visit (HOSPITAL_COMMUNITY)
Admission: RE | Admit: 2024-04-03 | Discharge: 2024-04-03 | Disposition: A | Discharge status: Home / Self Care | Source: Ambulatory Visit | Attending: Hematology and Oncology | Admitting: Hematology and Oncology

## 2024-04-03 DIAGNOSIS — C8121 Mixed cellularity classical Hodgkin lymphoma, lymph nodes of head, face, and neck: Secondary | ICD-10-CM | POA: Diagnosis not present

## 2024-04-03 DIAGNOSIS — I700 Atherosclerosis of aorta: Secondary | ICD-10-CM | POA: Diagnosis not present

## 2024-04-03 DIAGNOSIS — Z8571 Personal history of Hodgkin lymphoma: Secondary | ICD-10-CM | POA: Diagnosis not present

## 2024-04-03 DIAGNOSIS — D1809 Hemangioma of other sites: Secondary | ICD-10-CM | POA: Diagnosis not present

## 2024-04-03 MED ORDER — IOHEXOL 300 MG/ML  SOLN
100.0000 mL | Freq: Once | INTRAMUSCULAR | Status: AC | PRN
  Administered 2024-04-03: 100 mL via INTRAVENOUS

## 2024-04-03 MED ORDER — SODIUM CHLORIDE (PF) 0.9 % IJ SOLN
INTRAMUSCULAR | Status: AC
  Filled 2024-04-03: qty 50

## 2024-04-03 MED ORDER — IOHEXOL 9 MG/ML PO SOLN
ORAL | Status: AC
  Filled 2024-04-03: qty 1000

## 2024-04-03 MED ORDER — IOHEXOL 9 MG/ML PO SOLN
1000.0000 mL | Freq: Once | ORAL | Status: AC
  Administered 2024-04-03: 1000 mL via ORAL

## 2024-04-04 ENCOUNTER — Telehealth: Payer: Self-pay | Admitting: *Deleted

## 2024-04-04 NOTE — Telephone Encounter (Signed)
-----   Message from Rogerio Clay IV sent at 04/03/2024  5:12 PM EDT ----- Please let Mr. [name] know that his CT scan shows no evidence of new or recurrent lymphadenopathy.  Will plan to see him back as scheduled in October 2025. ----- Message ----- From: Dannis Dy, Rad Results In Sent: 04/03/2024   4:32 PM EDT To: John T Dorsey IV, MD

## 2024-04-04 NOTE — Telephone Encounter (Signed)
 [name] regarding recent CT scan results.  No answer but was able to leave detailed message on his identified phone vm.  Advised hat his CT scan shows no evidence of new or recurrent lymphadenopathy. Will plan to see him back as scheduled in October 2025.  Advised to call back to [phone] with any questions or concerns.

## 2024-04-24 DIAGNOSIS — G4733 Obstructive sleep apnea (adult) (pediatric): Secondary | ICD-10-CM | POA: Diagnosis not present

## 2024-09-27 ENCOUNTER — Other Ambulatory Visit: Payer: Self-pay | Admitting: Hematology and Oncology

## 2024-09-27 ENCOUNTER — Inpatient Hospital Stay: Admitting: Hematology and Oncology

## 2024-09-27 ENCOUNTER — Inpatient Hospital Stay: Attending: Family Medicine

## 2024-09-27 DIAGNOSIS — Z95828 Presence of other vascular implants and grafts: Secondary | ICD-10-CM

## 2024-09-27 DIAGNOSIS — C8121 Mixed cellularity classical Hodgkin lymphoma, lymph nodes of head, face, and neck: Secondary | ICD-10-CM

## 2024-09-27 NOTE — Progress Notes (Deleted)
 [facility], [facility] [phone]   [phone]  PROGRESS NOTE  Patient Care Team: Kip Righter, [name] as PCP - General (Family Medicine) Sheldon Standing, [name] as Consulting Physician (General Surgery) Federico Norleen Dean MADISON, [name] as Consulting Physician (Hematology and Oncology)  Hematological/Oncological History # Classical Hodgkin's Lymphoma, Mixed Cellularity. 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 2) 10/27/2020: resection of neck mass. Pathology showed classical Hodgkin's lymphoma, mixed cellularity subtype 3) 11/13/2020: establish care with Dr. Federico  4) 11/26/2020: PET CT scan shows single hypermetabolic unenlarged right level II cervical lymph node identified on today's study. FDG uptake is compatible with Deauville 4. Other small bilateral cervical and upper normal to borderline hepatoduodenal ligament lymph nodes show Deauville 2 uptake levels 5) 12/18/2020: Cycle 1 Day 1 of AVD chemotherapy  6) 01/15/2021: Cycle 2 Day 1 of AVD chemotherapy  7) 02/12/2021: PET CT scan shows no evidence of residual disease 8) 02/15/2021: Cycle 3 Day 1 of AVD chemotherapy  9) 03/01/2021: Cycle 3 Day 15 of AVD chemotherapy  10) 03/15/2021: Cycle 4 Day 1 of AVD chemotherapy  11) 03/29/2021: Cycle 4 Day 15 of AVD chemotherapy 12) 04/12/2021: Cycle 5 Day 1 of AVD chemotherapy 13) 04/26/2021: Cycle 5 Day 15 of AVD chemotherapy 14) 05/10/2021: Cycle 6 Day 1 of AVD chemotherapy 15) 05/24/2021: Cycle 6 Day 15 of AVD chemotherapy 16) 06/21/2021: PET CT scan shows no evidence of residual/recurrent lymphoma. Patient in complete remission, enter surviellance.  17) 10/04/2022: CT chest abdomen pelvis performed, it showed no evidence of residual or recurrent disease. 18) 03/21/2023: CT chest abdomen pelvis performed, it showed no evidence of residual or recurrent disease.  Interval History:  [name] 62 y.o. male with medical history significant for Classical Hodgkin's lymphoma Early Stage/Unfavorable risk who presents for a follow up visit. The patient's last visit was on 09/29/2023. In the interim since the last visit he has had no major changes in his health.  On exam today [name] reports he has been doing well in the interim since her last visit.  He reports the pollen has been bothering him more as he gets older.  He notes that in the interim since her last visit he has been gaining weight.  He notes that he will not be working for [employer] anymore as he are not using fresh dough and be using frozen dough as of May 21.  He reports that he is semiretired he is withdrawn some of his [employer].  He would like to continue working out 2 to 3 days/week.  He has been staying away from the flu and the norovirus.  He denies any bumps or lumps concerning for lymphadenopathy.  His energy levels are stable and his appetite is good.  Overall he feels well and has no questions concerns or complaints today.  Full 10 point ROS is otherwise negative.  MEDICAL HISTORY:  Past Medical History:  Diagnosis Date   Cancer (HCC) 09/2014   recent dx prostate ca-no tx yet   Diabetes mellitus without complication (HCC)    GERD (gastroesophageal reflux disease)    Hodgkin's lymphoma (HCC) 11/2020   Hypertension    Pneumonia    history   Sleep apnea    wears CPAP   Teeth decayed     SURGICAL HISTORY: Past Surgical History:  Procedure Laterality Date   IR IMAGING GUIDED PORT INSERTION  12/11/2020   IR  REMOVAL TUN ACCESS W/ PORT W/O FL MOD SED  07/22/2021   LYMPHADENECTOMY Bilateral 07/07/2022   Procedure: LYMPHADENECTOMY, PELVIC;  Surgeon: Renda Glance, [name];  Location: [facility];  Service: Urology;  Laterality: Bilateral;   MASS BIOPSY Left 09/29/2014   Procedure: LEFT LOWER NECK MASS EXCISION;  Surgeon: Ida Loader, [name];  Location: [facility];  Service: ENT;  Laterality: Left;    PAROTIDECTOMY Left 09/29/2014   Procedure: LEFT PAROTIDECTOMY;  Surgeon: Ida Loader, [name];  Location: [facility];  Service: ENT;  Laterality: Left;   ROBOT ASSISTED LAPAROSCOPIC RADICAL PROSTATECTOMY N/A 07/07/2022   Procedure: XI ROBOTIC ASSISTED LAPAROSCOPIC RADICAL PROSTATECTOMY LEVEL 2;  Surgeon: Renda Glance, [name];  Location: [facility];  Service: Urology;  Laterality: N/A;   SEPTOPLASTY  12/05/1974   UMBILICAL HERNIA REPAIR      SOCIAL HISTORY: Social History   Socioeconomic History   Marital status: Widowed    Spouse name: Not on file   Number of children: Not on file   Years of education: Not on file   Highest education level: Not on file  Occupational History   Not on file  Tobacco Use   Smoking status: Former    Current packs/day: 0.00    Average packs/day: 3.0 packs/day for 32.0 years (96.0 ttl pk-yrs)    Types: Cigarettes    Start date: 11/26/1985    Quit date: 11/26/2017    Years since quitting: 6.8   Smokeless tobacco: Never  Vaping Use   Vaping status: Never Used  Substance and Sexual Activity   Alcohol use: Yes    Comment: twice a year   Drug use: No   Sexual activity: Yes  Other Topics Concern   Not on file  Social History Narrative   Not on file   Social Drivers of Health   Financial Resource Strain: Not on file  Food Insecurity: No Food Insecurity (08/13/2023)   Hunger Vital Sign    Worried About Running Out of Food in the Last Year: Never true    Ran Out of Food in the Last Year: Never true  Transportation Needs: No Transportation Needs (08/13/2023)   PRAPARE - Administrator, Civil Service (Medical): No    Lack of Transportation (Non-Medical): No  Physical Activity: Not on file  Stress: Not on file  Social Connections: Not on file  Intimate Partner Violence: Not At Risk (08/13/2023)   Humiliation, Afraid, Rape, and Kick questionnaire    Fear of Current or Ex-Partner: No    Emotionally Abused: No    Physically Abused: No     Sexually Abused: No    FAMILY HISTORY: Family History  Problem Relation Age of Onset   Stroke Mother    Heart disease Father     ALLERGIES:  has no known allergies.  MEDICATIONS:  Current Outpatient Medications  Medication Sig Dispense Refill   amLODipine  (NORVASC ) 5 MG tablet Take 5 mg by mouth every evening.     folic acid (FOLVITE) 1 MG tablet Take 1 mg by mouth daily.     losartan  (COZAAR ) 100 MG tablet Take 100 mg by mouth every evening.     metFORMIN (GLUCOPHAGE) 500 MG tablet Take 500 mg by mouth 2 (two) times daily with a meal.     methotrexate (RHEUMATREX) 2.5 MG tablet Take 20 mg by mouth once a week.     pantoprazole  (PROTONIX ) 40 MG tablet Take 40 mg by mouth every evening.     predniSONE (  DELTASONE) 5 MG tablet Take 5 mg by mouth daily.     rosuvastatin  (CRESTOR ) 5 MG tablet Take 5 mg by mouth every evening.     No current facility-administered medications for this visit.   Facility-Administered Medications Ordered in Other Visits  Medication Dose Route Frequency Provider Last Rate Last Admin   0.9 %  sodium chloride  infusion   Intravenous Continuous Allred, Darrell K, PA-C        REVIEW OF SYSTEMS:   Constitutional: ( - ) fevers, ( - )  chills , ( - ) night sweats Eyes: ( - ) blurriness of vision, ( - ) double vision, ( - ) watery eyes Ears, nose, mouth, throat, and face: ( - ) mucositis, ( - ) sore throat Respiratory: ( - ) cough, ( - ) dyspnea, ( - ) wheezes Cardiovascular: ( - ) palpitation, ( - ) chest discomfort, ( - ) lower extremity swelling Gastrointestinal:  ( - ) nausea, ( - ) heartburn, ( - ) change in bowel habits Skin: ( - ) abnormal skin rashes Lymphatics: ( - ) new lymphadenopathy, ( - ) easy bruising Neurological: ( - ) numbness, ( - ) tingling, ( - ) new weaknesses Behavioral/Psych: ( - ) mood change, ( - ) new changes  All other systems were reviewed with the patient and are negative.  PHYSICAL EXAMINATION: ECOG PERFORMANCE STATUS: 1 -  Symptomatic but completely ambulatory  There were no vitals filed for this visit.       There were no vitals filed for this visit.        GENERAL: well appearing middle aged Caucasian male in NAD SKIN: skin color, texture, turgor are normal, no rashes or significant lesions EYES: conjunctiva are pink and non-injected, sclera clear LUNGS: clear to auscultation and percussion with normal breathing effort HEART: regular rate & rhythm and no murmurs and no lower extremity edema Musculoskeletal: no cyanosis of digits and no clubbing  PSYCH: alert & oriented x 3, fluent speech NEURO: no focal motor/sensory deficits  LABORATORY DATA:  I have reviewed the data as listed    Latest Ref Rng & Units 03/27/2024   10:04 AM 09/29/2023    2:03 PM 08/13/2023    4:15 AM  CBC  WBC 4.0 - 10.5 K/uL 5.8  6.5  8.8   Hemoglobin 13.0 - 17.0 g/dL 85.7  87.2  86.3   Hematocrit 39.0 - 52.0 % 42.2  37.4  40.9   Platelets 150 - 400 K/uL 206  228  147        Latest Ref Rng & Units 03/27/2024   10:04 AM 09/29/2023    2:03 PM 08/13/2023    4:15 AM  CMP  Glucose 70 - 99 mg/dL 882  891  870   BUN 8 - 23 mg/dL 11  10  25    Creatinine 0.61 - 1.24 mg/dL 9.11  8.90  8.79   Sodium 135 - 145 mmol/L 139  139  133   Potassium 3.5 - 5.1 mmol/L 4.0  3.6  3.3   Chloride 98 - 111 mmol/L 104  103  97   CO2 22 - 32 mmol/L 28  28  26    Calcium  8.9 - 10.3 mg/dL 9.1  9.2  8.7   Total Protein 6.5 - 8.1 g/dL 7.2  7.0    Total Bilirubin 0.0 - 1.2 mg/dL 0.5  0.4    Alkaline Phos 38 - 126 U/L 59  58    AST 15 -  41 U/L 19  12    ALT 0 - 44 U/L 24  12      No results found for: MPROTEIN No results found for: KPAFRELGTCHN, LAMBDASER, KAPLAMBRATIO   RADIOGRAPHIC STUDIES:  No results found.  ASSESSMENT & PLAN [name] 63 y.o. male with medical history significant for Classical Hodgkin's lymphoma Early Stage/Unfavorable risk who presents for a follow up visit.  After review the labs, the records, review  of the PFTs, and review the echocardiogram the findings are most consistent with an early stage/unfavorable risk classical Hodgkin's lymphoma.  Given that he meets unfavorable criteria via multiple guidelines I recommended that we proceed with treatment as recommended in the NCCN guidelines for patients with early stage unfavorable disease (which is the exact same treatment recommended for stage III-IV disease).  Previously we discussed the AVD chemotherapy regimen.  We discussed the expected side effects including possible hair loss, nausea, vomiting, diarrhea, constipation, cytopenias, neutropenia, cardiac dysfunction, and neurological damage.  The patient will be treated with every 2 week rounds of chemotherapy using this regimen. AVD chemotherapy consists of doxorubicin  25 mg/m on days 1 and 15, vinblastine  6 mg per metered squared IV on days 1 and 15, and dacarbazine  375 mg/m IV on days 1 and 15.  After 2 cycles of AVD chemotherapy, PET/CT scan from 02/12/2021 revealed complete response to therapy with no evidence of lymphoma. The recommendation is to proceed with a further 4 cycles of AVD therapy.    # Classical Hodgkin's Lymphoma, Mixed Cellularity. 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  --findings were consistent with an Early stage unfavorable risk disease (due to age, ESR elevation, and mixed cellularity based on EORTC, GHSG, and ECGO criteria) --his disease appears early stage with one clear site of involvement in the neck. There are some smaller lymph nodes in the neck and one near the hepatoduodenal ligament, though would favor diagnosis of early stage --based on these criteria the recommendation would be for AVD x 2 cycles with interval PET CT scan followed by AVD x 4 cycles.  --interval PET on 06/21/2021 showed complete response to therapy --we avoided bleomycin in this patient due to smoking history, mild abnormalities on PFTs and age Plan: -- Labs (CBC, CMP, LDH) from today were  reviewed without any intervention needed.  -- Labs show white blood cell count 5.8, hemoglobin 14.2, MCV 89, platelets 206 --CT scan q 12 months. Next scan due April 2025.  Last scan on 03/21/2023 showed no evidence of residual or recurrent disease. --RTC in 6 months    #Supportive Care --chemotherapy education complete --port removed on 07/22/2021.    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 No biopsy recommended.  --continue to monitor on future scans.   #Prostate Cancer, Gleason 3+4=7. S/p resection --s/p robotic assisted laparoscopic radical prostatectomy on 07/07/2022.  -- Noted to have benign lymph nodes and Prostatic adenocarcinoma, Gleason score 3+4=7  -- Currently being managed by urology service.  No orders of the defined types were placed in this encounter.  All questions were answered. The patient knows to call the clinic with any problems, questions or concerns.  I have spent a total of 30 minutes minutes of face-to-face and non-face-to-face time, preparing to see the patient, obtaining and/or reviewing separately obtained history, performing a medically appropriate examination, counseling and educating the patient, ordering tests,documenting clinical  information in the electronic health record and care coordination.   Norleen IVAR Kidney, [name] Department of Hematology/Oncology [facility] Phone: [phone] Pager: [phone] Email: [email or web address]  09/27/2024 1:19 PM
